# Patient Record
Sex: Male | Born: 1950 | Race: Black or African American | Hispanic: No | Marital: Married | Smoking: Former smoker
Health system: Southern US, Community
[De-identification: ages and names within clinical notes are randomized; demographics above are authoritative.]

## PROBLEM LIST (undated history)

## (undated) DIAGNOSIS — D649 Anemia, unspecified: Secondary | ICD-10-CM

## (undated) DIAGNOSIS — N2581 Secondary hyperparathyroidism of renal origin: Secondary | ICD-10-CM

## (undated) DIAGNOSIS — K219 Gastro-esophageal reflux disease without esophagitis: Secondary | ICD-10-CM

## (undated) DIAGNOSIS — I2510 Atherosclerotic heart disease of native coronary artery without angina pectoris: Secondary | ICD-10-CM

## (undated) DIAGNOSIS — R1900 Intra-abdominal and pelvic swelling, mass and lump, unspecified site: Secondary | ICD-10-CM

## (undated) DIAGNOSIS — I428 Other cardiomyopathies: Secondary | ICD-10-CM

## (undated) DIAGNOSIS — I959 Hypotension, unspecified: Secondary | ICD-10-CM

## (undated) DIAGNOSIS — K611 Rectal abscess: Secondary | ICD-10-CM

## (undated) DIAGNOSIS — J449 Chronic obstructive pulmonary disease, unspecified: Secondary | ICD-10-CM

## (undated) DIAGNOSIS — N186 End stage renal disease: Secondary | ICD-10-CM

## (undated) HISTORY — DX: Other cardiomyopathies: I42.8

## (undated) HISTORY — PX: PARATHYROIDECTOMY: SHX19

## (undated) HISTORY — DX: End stage renal disease: N18.6

## (undated) HISTORY — DX: Chronic obstructive pulmonary disease, unspecified: J44.9

## (undated) HISTORY — PX: CARDIAC CATHETERIZATION: SHX172

## (undated) HISTORY — DX: Rectal abscess: K61.1

## (undated) HISTORY — DX: Secondary hyperparathyroidism of renal origin: N25.81

## (undated) HISTORY — PX: AV FISTULA PLACEMENT: SHX1204

## (undated) HISTORY — PX: INSERTION OF DIALYSIS CATHETER: SHX1324

## (undated) HISTORY — DX: Intra-abdominal and pelvic swelling, mass and lump, unspecified site: R19.00

---

## 1994-01-25 DIAGNOSIS — N186 End stage renal disease: Secondary | ICD-10-CM

## 1994-01-25 HISTORY — DX: End stage renal disease: N18.6

## 1997-06-21 ENCOUNTER — Other Ambulatory Visit: Admission: RE | Admit: 1997-06-21 | Discharge: 1997-06-21 | Payer: Self-pay | Admitting: Nephrology

## 1997-07-29 ENCOUNTER — Ambulatory Visit (HOSPITAL_COMMUNITY): Admission: RE | Admit: 1997-07-29 | Discharge: 1997-07-29 | Payer: Self-pay | Admitting: Vascular Surgery

## 1997-07-30 ENCOUNTER — Ambulatory Visit (HOSPITAL_COMMUNITY): Admission: RE | Admit: 1997-07-30 | Discharge: 1997-07-30 | Payer: Self-pay | Admitting: Nephrology

## 1997-07-30 ENCOUNTER — Ambulatory Visit (HOSPITAL_COMMUNITY): Admission: RE | Admit: 1997-07-30 | Discharge: 1997-07-30 | Payer: Self-pay | Admitting: Cardiothoracic Surgery

## 1997-08-02 ENCOUNTER — Ambulatory Visit (HOSPITAL_COMMUNITY): Admission: RE | Admit: 1997-08-02 | Discharge: 1997-08-02 | Payer: Self-pay | Admitting: Nephrology

## 1997-08-08 ENCOUNTER — Ambulatory Visit (HOSPITAL_COMMUNITY): Admission: RE | Admit: 1997-08-08 | Discharge: 1997-08-08 | Payer: Self-pay | Admitting: Nephrology

## 1997-08-12 ENCOUNTER — Encounter (HOSPITAL_COMMUNITY): Admission: RE | Admit: 1997-08-12 | Discharge: 1997-11-10 | Payer: Self-pay | Admitting: Nephrology

## 1997-08-12 ENCOUNTER — Other Ambulatory Visit: Admission: RE | Admit: 1997-08-12 | Discharge: 1997-08-12 | Payer: Self-pay | Admitting: Nephrology

## 1997-08-14 ENCOUNTER — Other Ambulatory Visit: Admission: RE | Admit: 1997-08-14 | Discharge: 1997-08-14 | Payer: Self-pay | Admitting: Nephrology

## 1997-09-02 ENCOUNTER — Ambulatory Visit (HOSPITAL_COMMUNITY): Admission: RE | Admit: 1997-09-02 | Discharge: 1997-09-02 | Payer: Self-pay | Admitting: Vascular Surgery

## 1997-09-30 ENCOUNTER — Ambulatory Visit (HOSPITAL_COMMUNITY): Admission: RE | Admit: 1997-09-30 | Discharge: 1997-09-30 | Payer: Self-pay | Admitting: Vascular Surgery

## 1997-10-24 ENCOUNTER — Ambulatory Visit (HOSPITAL_COMMUNITY): Admission: RE | Admit: 1997-10-24 | Discharge: 1997-10-24 | Payer: Self-pay | Admitting: Vascular Surgery

## 1997-11-21 ENCOUNTER — Ambulatory Visit (HOSPITAL_COMMUNITY): Admission: RE | Admit: 1997-11-21 | Discharge: 1997-11-21 | Payer: Self-pay | Admitting: Vascular Surgery

## 1997-12-13 ENCOUNTER — Ambulatory Visit (HOSPITAL_COMMUNITY): Admission: RE | Admit: 1997-12-13 | Discharge: 1997-12-13 | Payer: Self-pay | Admitting: Vascular Surgery

## 1997-12-17 ENCOUNTER — Ambulatory Visit (HOSPITAL_COMMUNITY): Admission: RE | Admit: 1997-12-17 | Discharge: 1997-12-17 | Payer: Self-pay | Admitting: *Deleted

## 1997-12-18 ENCOUNTER — Encounter (HOSPITAL_COMMUNITY): Admission: RE | Admit: 1997-12-18 | Discharge: 1998-03-18 | Payer: Self-pay | Admitting: *Deleted

## 1997-12-24 ENCOUNTER — Ambulatory Visit (HOSPITAL_COMMUNITY): Admission: RE | Admit: 1997-12-24 | Discharge: 1997-12-24 | Payer: Self-pay | Admitting: Vascular Surgery

## 1998-02-07 ENCOUNTER — Ambulatory Visit (HOSPITAL_COMMUNITY): Admission: RE | Admit: 1998-02-07 | Discharge: 1998-02-07 | Payer: Self-pay | Admitting: *Deleted

## 1998-02-11 ENCOUNTER — Ambulatory Visit (HOSPITAL_COMMUNITY): Admission: RE | Admit: 1998-02-11 | Discharge: 1998-02-11 | Payer: Self-pay | Admitting: *Deleted

## 1998-02-20 ENCOUNTER — Ambulatory Visit: Admission: RE | Admit: 1998-02-20 | Discharge: 1998-02-20 | Payer: Self-pay | Admitting: Oral Surgery

## 1998-03-21 ENCOUNTER — Encounter: Payer: Self-pay | Admitting: Vascular Surgery

## 1998-03-21 ENCOUNTER — Ambulatory Visit (HOSPITAL_COMMUNITY): Admission: RE | Admit: 1998-03-21 | Discharge: 1998-03-21 | Payer: Self-pay | Admitting: Vascular Surgery

## 1998-04-01 ENCOUNTER — Ambulatory Visit (HOSPITAL_COMMUNITY): Admission: RE | Admit: 1998-04-01 | Discharge: 1998-04-01 | Payer: Self-pay | Admitting: *Deleted

## 1998-04-16 ENCOUNTER — Ambulatory Visit (HOSPITAL_COMMUNITY): Admission: RE | Admit: 1998-04-16 | Discharge: 1998-04-16 | Payer: Self-pay | Admitting: Vascular Surgery

## 1998-04-16 ENCOUNTER — Encounter: Payer: Self-pay | Admitting: Vascular Surgery

## 1998-04-23 ENCOUNTER — Encounter: Payer: Self-pay | Admitting: Vascular Surgery

## 1998-04-23 ENCOUNTER — Ambulatory Visit (HOSPITAL_COMMUNITY): Admission: RE | Admit: 1998-04-23 | Discharge: 1998-04-23 | Payer: Self-pay | Admitting: Vascular Surgery

## 1998-07-07 ENCOUNTER — Ambulatory Visit (HOSPITAL_COMMUNITY): Admission: RE | Admit: 1998-07-07 | Discharge: 1998-07-07 | Payer: Self-pay | Admitting: Nephrology

## 1998-07-16 ENCOUNTER — Ambulatory Visit (HOSPITAL_COMMUNITY): Admission: RE | Admit: 1998-07-16 | Discharge: 1998-07-16 | Payer: Self-pay | Admitting: Vascular Surgery

## 1998-07-16 ENCOUNTER — Encounter: Payer: Self-pay | Admitting: Vascular Surgery

## 1998-07-17 ENCOUNTER — Ambulatory Visit (HOSPITAL_COMMUNITY): Admission: RE | Admit: 1998-07-17 | Discharge: 1998-07-17 | Payer: Self-pay | Admitting: Vascular Surgery

## 1998-07-17 ENCOUNTER — Encounter: Payer: Self-pay | Admitting: Vascular Surgery

## 1998-07-22 ENCOUNTER — Encounter (HOSPITAL_COMMUNITY): Admission: RE | Admit: 1998-07-22 | Discharge: 1998-08-25 | Payer: Self-pay | Admitting: *Deleted

## 1998-12-23 ENCOUNTER — Encounter: Payer: Self-pay | Admitting: Vascular Surgery

## 1998-12-23 ENCOUNTER — Ambulatory Visit (HOSPITAL_COMMUNITY): Admission: RE | Admit: 1998-12-23 | Discharge: 1998-12-23 | Payer: Self-pay | Admitting: Vascular Surgery

## 1999-06-18 ENCOUNTER — Ambulatory Visit (HOSPITAL_COMMUNITY): Admission: RE | Admit: 1999-06-18 | Discharge: 1999-06-18 | Payer: Self-pay | Admitting: Nephrology

## 1999-07-30 ENCOUNTER — Ambulatory Visit (HOSPITAL_COMMUNITY): Admission: RE | Admit: 1999-07-30 | Discharge: 1999-07-30 | Payer: Self-pay | Admitting: Nephrology

## 1999-08-13 ENCOUNTER — Encounter (HOSPITAL_COMMUNITY): Admission: RE | Admit: 1999-08-13 | Discharge: 1999-11-11 | Payer: Self-pay | Admitting: Nephrology

## 1999-08-20 ENCOUNTER — Ambulatory Visit: Admission: RE | Admit: 1999-08-20 | Discharge: 1999-08-20 | Payer: Self-pay | Admitting: Vascular Surgery

## 1999-08-20 ENCOUNTER — Encounter: Payer: Self-pay | Admitting: Vascular Surgery

## 1999-11-10 ENCOUNTER — Encounter: Payer: Self-pay | Admitting: Vascular Surgery

## 1999-11-10 ENCOUNTER — Ambulatory Visit (HOSPITAL_COMMUNITY): Admission: RE | Admit: 1999-11-10 | Discharge: 1999-11-10 | Payer: Self-pay | Admitting: Vascular Surgery

## 1999-12-18 ENCOUNTER — Ambulatory Visit (HOSPITAL_COMMUNITY): Admission: RE | Admit: 1999-12-18 | Discharge: 1999-12-18 | Payer: Self-pay | Admitting: Nephrology

## 2000-05-24 ENCOUNTER — Ambulatory Visit (HOSPITAL_COMMUNITY): Admission: RE | Admit: 2000-05-24 | Discharge: 2000-05-24 | Payer: Self-pay | Admitting: Vascular Surgery

## 2000-06-02 ENCOUNTER — Inpatient Hospital Stay (HOSPITAL_COMMUNITY): Admission: RE | Admit: 2000-06-02 | Discharge: 2000-06-04 | Payer: Self-pay

## 2000-06-02 ENCOUNTER — Encounter (INDEPENDENT_AMBULATORY_CARE_PROVIDER_SITE_OTHER): Payer: Self-pay | Admitting: *Deleted

## 2000-07-07 ENCOUNTER — Ambulatory Visit (HOSPITAL_COMMUNITY): Admission: RE | Admit: 2000-07-07 | Discharge: 2000-07-07 | Payer: Self-pay | Admitting: Nephrology

## 2000-07-07 ENCOUNTER — Encounter: Payer: Self-pay | Admitting: Nephrology

## 2000-11-28 ENCOUNTER — Encounter: Payer: Self-pay | Admitting: Vascular Surgery

## 2000-11-28 ENCOUNTER — Inpatient Hospital Stay (HOSPITAL_COMMUNITY): Admission: RE | Admit: 2000-11-28 | Discharge: 2000-11-28 | Payer: Self-pay | Admitting: Vascular Surgery

## 2000-11-29 ENCOUNTER — Encounter: Payer: Self-pay | Admitting: Pediatrics

## 2000-11-29 ENCOUNTER — Inpatient Hospital Stay (HOSPITAL_COMMUNITY): Admission: RE | Admit: 2000-11-29 | Discharge: 2000-12-01 | Payer: Self-pay | Admitting: *Deleted

## 2001-07-21 ENCOUNTER — Ambulatory Visit (HOSPITAL_COMMUNITY): Admission: RE | Admit: 2001-07-21 | Discharge: 2001-07-21 | Payer: Self-pay | Admitting: Vascular Surgery

## 2001-07-21 ENCOUNTER — Encounter: Payer: Self-pay | Admitting: Vascular Surgery

## 2002-05-27 ENCOUNTER — Observation Stay (HOSPITAL_COMMUNITY): Admission: EM | Admit: 2002-05-27 | Discharge: 2002-05-27 | Payer: Self-pay | Admitting: *Deleted

## 2002-05-27 ENCOUNTER — Encounter: Payer: Self-pay | Admitting: Vascular Surgery

## 2003-03-07 ENCOUNTER — Ambulatory Visit (HOSPITAL_COMMUNITY): Admission: RE | Admit: 2003-03-07 | Discharge: 2003-03-07 | Payer: Self-pay | Admitting: Nephrology

## 2004-09-24 ENCOUNTER — Encounter (INDEPENDENT_AMBULATORY_CARE_PROVIDER_SITE_OTHER): Payer: Self-pay | Admitting: *Deleted

## 2004-09-24 ENCOUNTER — Ambulatory Visit (HOSPITAL_COMMUNITY): Admission: RE | Admit: 2004-09-24 | Discharge: 2004-09-24 | Payer: Self-pay | Admitting: Nephrology

## 2005-01-07 ENCOUNTER — Ambulatory Visit (HOSPITAL_COMMUNITY): Admission: RE | Admit: 2005-01-07 | Discharge: 2005-01-07 | Payer: Self-pay | Admitting: Nephrology

## 2005-05-20 ENCOUNTER — Ambulatory Visit (HOSPITAL_COMMUNITY): Admission: RE | Admit: 2005-05-20 | Discharge: 2005-05-20 | Payer: Self-pay | Admitting: Nephrology

## 2005-11-09 ENCOUNTER — Encounter: Admission: RE | Admit: 2005-11-09 | Discharge: 2005-11-09 | Payer: Self-pay | Admitting: Nephrology

## 2006-06-10 ENCOUNTER — Ambulatory Visit (HOSPITAL_COMMUNITY): Admission: RE | Admit: 2006-06-10 | Discharge: 2006-06-10 | Payer: Self-pay | Admitting: *Deleted

## 2006-06-10 ENCOUNTER — Ambulatory Visit: Payer: Self-pay | Admitting: Vascular Surgery

## 2006-11-08 ENCOUNTER — Ambulatory Visit (HOSPITAL_COMMUNITY): Admission: RE | Admit: 2006-11-08 | Discharge: 2006-11-08 | Payer: Self-pay | Admitting: *Deleted

## 2006-11-24 ENCOUNTER — Ambulatory Visit: Payer: Self-pay | Admitting: *Deleted

## 2006-12-02 ENCOUNTER — Ambulatory Visit: Payer: Self-pay | Admitting: Vascular Surgery

## 2006-12-02 ENCOUNTER — Ambulatory Visit (HOSPITAL_COMMUNITY): Admission: RE | Admit: 2006-12-02 | Discharge: 2006-12-02 | Payer: Self-pay | Admitting: *Deleted

## 2006-12-22 ENCOUNTER — Emergency Department (HOSPITAL_COMMUNITY): Admission: EM | Admit: 2006-12-22 | Discharge: 2006-12-22 | Payer: Self-pay | Admitting: Emergency Medicine

## 2006-12-23 ENCOUNTER — Ambulatory Visit (HOSPITAL_COMMUNITY): Admission: RE | Admit: 2006-12-23 | Discharge: 2006-12-23 | Payer: Self-pay | Admitting: Vascular Surgery

## 2007-01-05 ENCOUNTER — Ambulatory Visit (HOSPITAL_COMMUNITY): Admission: RE | Admit: 2007-01-05 | Discharge: 2007-01-05 | Payer: Self-pay | Admitting: Vascular Surgery

## 2007-01-19 ENCOUNTER — Inpatient Hospital Stay (HOSPITAL_COMMUNITY): Admission: EM | Admit: 2007-01-19 | Discharge: 2007-01-22 | Payer: Self-pay | Admitting: Emergency Medicine

## 2007-01-21 ENCOUNTER — Ambulatory Visit: Payer: Self-pay | Admitting: Vascular Surgery

## 2007-01-24 ENCOUNTER — Ambulatory Visit (HOSPITAL_COMMUNITY): Admission: RE | Admit: 2007-01-24 | Discharge: 2007-01-24 | Payer: Self-pay | Admitting: Surgery

## 2007-05-02 ENCOUNTER — Ambulatory Visit (HOSPITAL_COMMUNITY): Admission: RE | Admit: 2007-05-02 | Discharge: 2007-05-02 | Payer: Self-pay | Admitting: Nephrology

## 2007-08-18 ENCOUNTER — Ambulatory Visit (HOSPITAL_COMMUNITY): Admission: RE | Admit: 2007-08-18 | Discharge: 2007-08-18 | Payer: Self-pay | Admitting: Nephrology

## 2007-09-27 ENCOUNTER — Ambulatory Visit (HOSPITAL_COMMUNITY): Admission: RE | Admit: 2007-09-27 | Discharge: 2007-09-27 | Payer: Self-pay | Admitting: Nephrology

## 2007-10-15 ENCOUNTER — Ambulatory Visit: Payer: Self-pay | Admitting: *Deleted

## 2007-10-15 ENCOUNTER — Inpatient Hospital Stay (HOSPITAL_COMMUNITY): Admission: EM | Admit: 2007-10-15 | Discharge: 2007-10-15 | Payer: Self-pay | Admitting: Emergency Medicine

## 2007-10-16 ENCOUNTER — Ambulatory Visit (HOSPITAL_COMMUNITY): Admission: RE | Admit: 2007-10-16 | Discharge: 2007-10-16 | Payer: Self-pay

## 2007-10-25 ENCOUNTER — Ambulatory Visit (HOSPITAL_COMMUNITY): Admission: RE | Admit: 2007-10-25 | Discharge: 2007-10-25 | Payer: Self-pay | Admitting: Nephrology

## 2007-10-31 ENCOUNTER — Ambulatory Visit (HOSPITAL_COMMUNITY): Admission: RE | Admit: 2007-10-31 | Discharge: 2007-10-31 | Payer: Self-pay | Admitting: Nephrology

## 2007-11-07 ENCOUNTER — Ambulatory Visit (HOSPITAL_COMMUNITY): Admission: RE | Admit: 2007-11-07 | Discharge: 2007-11-07 | Payer: Self-pay | Admitting: Vascular Surgery

## 2007-11-07 ENCOUNTER — Ambulatory Visit: Payer: Self-pay | Admitting: Surgery

## 2007-11-14 ENCOUNTER — Ambulatory Visit (HOSPITAL_COMMUNITY): Admission: RE | Admit: 2007-11-14 | Discharge: 2007-11-14 | Payer: Self-pay | Admitting: Nephrology

## 2007-11-23 ENCOUNTER — Ambulatory Visit: Payer: Self-pay | Admitting: Surgery

## 2008-03-09 ENCOUNTER — Ambulatory Visit (HOSPITAL_COMMUNITY): Admission: RE | Admit: 2008-03-09 | Discharge: 2008-03-09 | Payer: Self-pay | Admitting: Surgery

## 2008-03-10 ENCOUNTER — Ambulatory Visit (HOSPITAL_COMMUNITY): Admission: RE | Admit: 2008-03-10 | Discharge: 2008-03-10 | Payer: Self-pay | Admitting: Nephrology

## 2008-03-11 ENCOUNTER — Ambulatory Visit (HOSPITAL_COMMUNITY): Admission: RE | Admit: 2008-03-11 | Discharge: 2008-03-11 | Payer: Self-pay | Admitting: Nephrology

## 2008-03-13 ENCOUNTER — Ambulatory Visit: Payer: Self-pay | Admitting: Vascular Surgery

## 2008-04-02 ENCOUNTER — Ambulatory Visit: Payer: Self-pay | Admitting: Vascular Surgery

## 2008-04-02 ENCOUNTER — Ambulatory Visit (HOSPITAL_COMMUNITY): Admission: RE | Admit: 2008-04-02 | Discharge: 2008-04-02 | Payer: Self-pay | Admitting: Vascular Surgery

## 2008-05-02 ENCOUNTER — Ambulatory Visit (HOSPITAL_COMMUNITY): Admission: RE | Admit: 2008-05-02 | Discharge: 2008-05-02 | Payer: Self-pay | Admitting: Nephrology

## 2008-05-23 ENCOUNTER — Ambulatory Visit (HOSPITAL_COMMUNITY): Admission: RE | Admit: 2008-05-23 | Discharge: 2008-05-23 | Payer: Self-pay | Admitting: Vascular Surgery

## 2008-05-23 ENCOUNTER — Ambulatory Visit: Payer: Self-pay | Admitting: Vascular Surgery

## 2008-06-18 ENCOUNTER — Ambulatory Visit: Payer: Self-pay | Admitting: *Deleted

## 2008-07-08 ENCOUNTER — Ambulatory Visit: Admission: RE | Admit: 2008-07-08 | Discharge: 2008-07-08 | Payer: Self-pay | Admitting: Nephrology

## 2008-09-12 ENCOUNTER — Ambulatory Visit: Payer: Self-pay | Admitting: Vascular Surgery

## 2008-10-18 IMAGING — CR DG CHEST 1V PORT
1 series · 1 of 1 positions shown · non-contrast
Comparison: 12/23/06.

CLINICAL DATA: Dialysis dependent patient, cough, shortness of breath.
PORTABLE CHEST - 1 VIEW 01/19/07:

[AP]
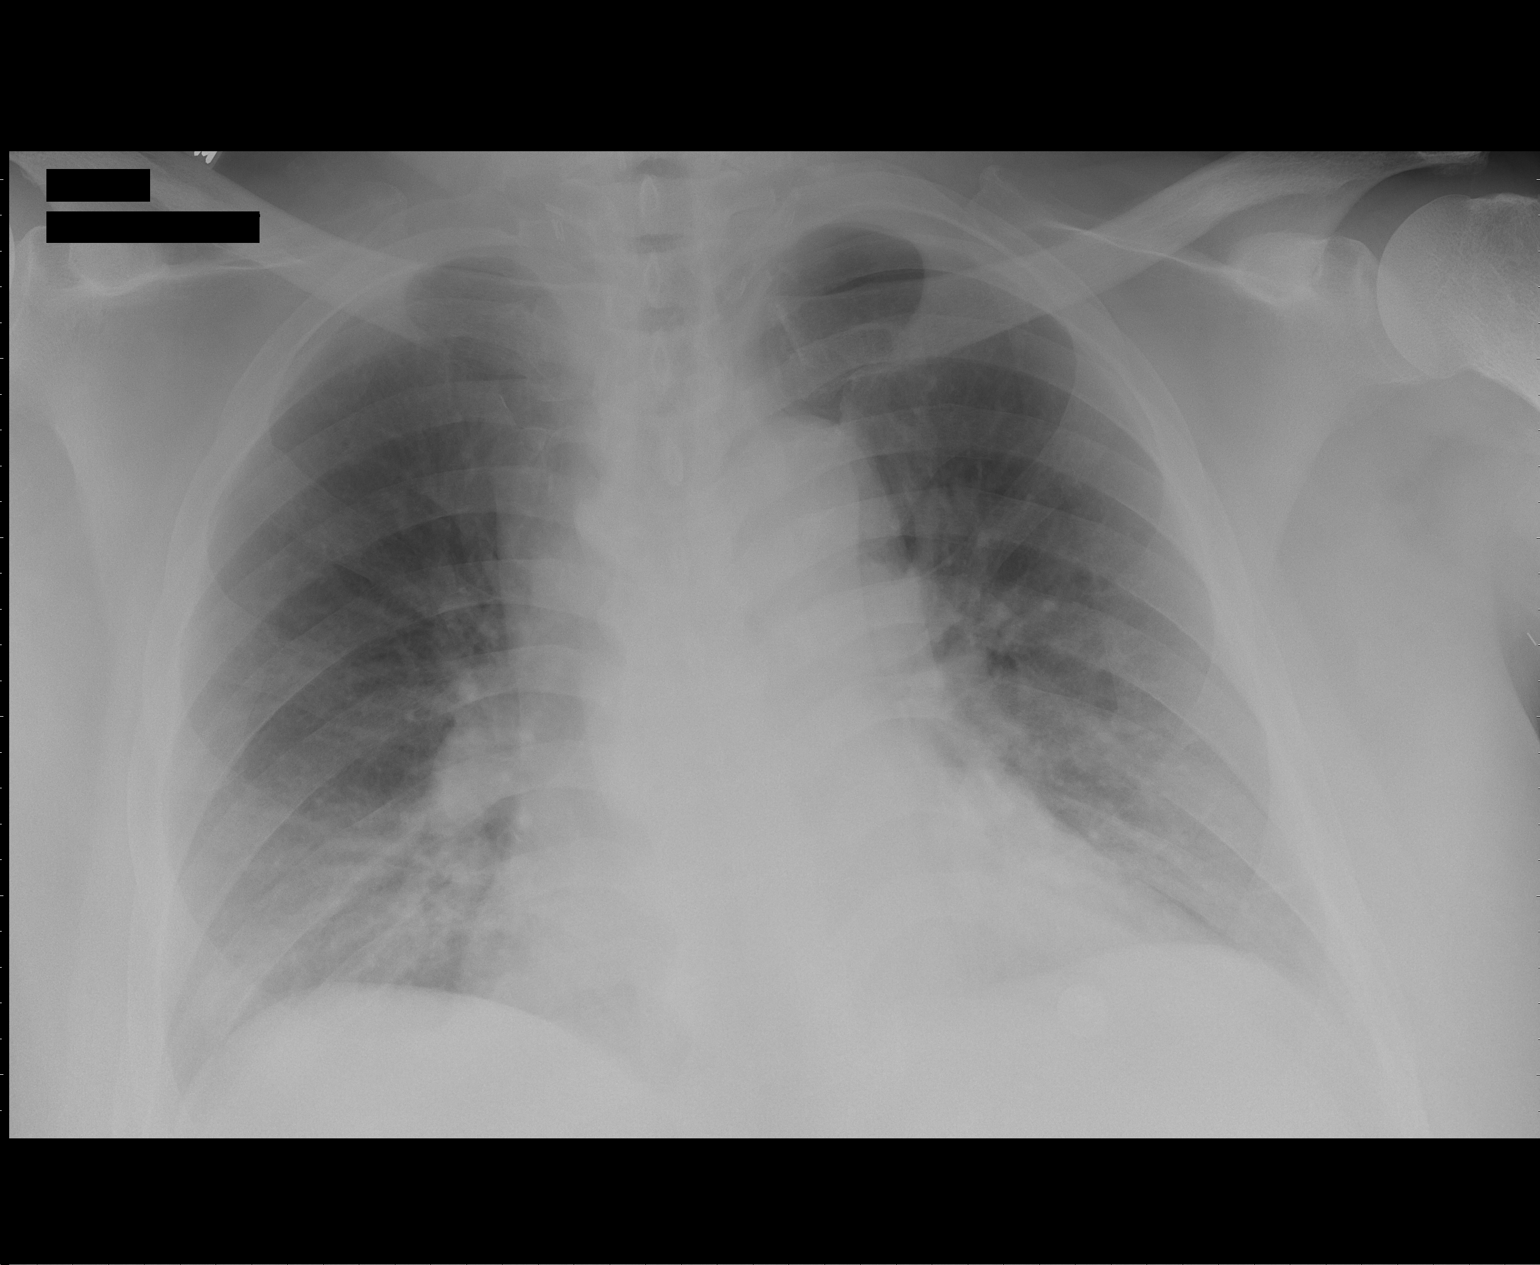

[1 of 1 positions shown; findings below may reference images not displayed]

FINDINGS: The heart is enlarged with mild hilar and basilar interstitial edema pattern compared to 12/02/06.  No large effusion or pneumothorax.
IMPRESSION: Cardiomegaly with early edema.

## 2008-10-24 ENCOUNTER — Ambulatory Visit: Payer: Self-pay | Admitting: Vascular Surgery

## 2008-10-31 ENCOUNTER — Ambulatory Visit: Payer: Self-pay | Admitting: Vascular Surgery

## 2008-11-04 ENCOUNTER — Ambulatory Visit (HOSPITAL_COMMUNITY): Admission: RE | Admit: 2008-11-04 | Discharge: 2008-11-04 | Payer: Self-pay | Admitting: Nephrology

## 2009-01-17 ENCOUNTER — Ambulatory Visit (HOSPITAL_COMMUNITY): Admission: EM | Admit: 2009-01-17 | Discharge: 2009-01-17 | Payer: Self-pay | Admitting: Emergency Medicine

## 2009-01-17 ENCOUNTER — Ambulatory Visit: Payer: Self-pay | Admitting: Vascular Surgery

## 2009-01-30 ENCOUNTER — Ambulatory Visit: Payer: Self-pay | Admitting: Vascular Surgery

## 2009-03-13 ENCOUNTER — Ambulatory Visit: Payer: Self-pay | Admitting: Vascular Surgery

## 2009-03-31 ENCOUNTER — Ambulatory Visit: Payer: Self-pay | Admitting: Vascular Surgery

## 2009-06-04 ENCOUNTER — Ambulatory Visit: Payer: Self-pay | Admitting: Vascular Surgery

## 2009-07-24 IMAGING — US IR INTRO INTRAVASC STENT 1ST VESSEL
1 series · 2 of 2 positions shown · non-contrast
Comparison: none

CLINICAL DATA: End stage renal disease.  The patient has had
recurrent thrombosis of a right upper arm dialysis graft and is
status post percutaneous declot procedures on 08/18/2007 and
09/27/2007.  Surgical revision was performed on the [DATE] with
placement of a 6 mm extension graft.  The graft then became
reoccluded within 2 days and a left femoral dialysis catheter was
placed. The patient now presents for additional declot procedure
with planned placement of a covered stent to treat resistant
anastomotic stenosis.

[Series 1: ir intro intravasc stent 1st vessel · 2 of 2 slices shown]
[im 1/2]
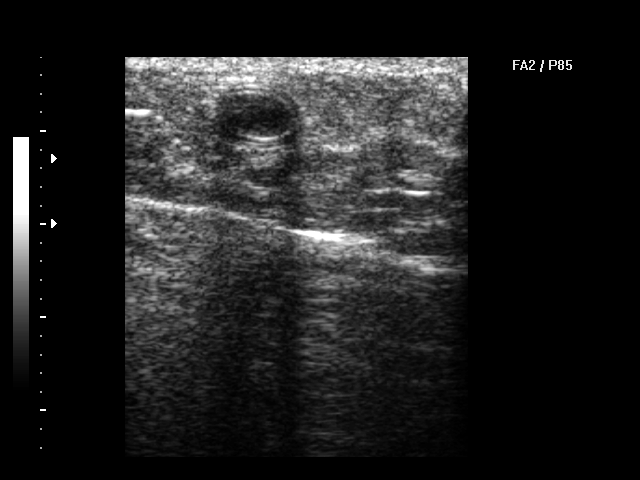
[im 2/2]
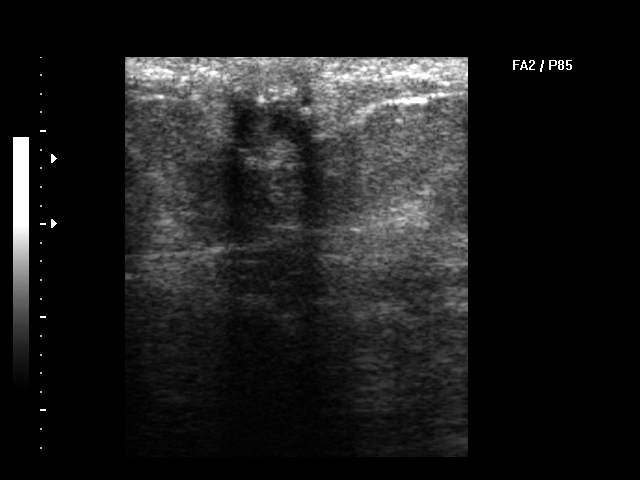

[2 of 2 positions shown; findings below may reference images not displayed]

1.  ULTRASOUND GUIDANCE FOR VASCULAR ACCESS OF RIGHT UPPER ARM
DIALYSIS GRAFT
2.  DIALYSIS GRAFT DECLOT PROCEDURE
3.  GRAFT AND VENOUS ANGIOPLASTY.
4.  INTRAVASCULAR STENT PLACEMENT

Sedation: 5.0 mg IV Versed; 250 mcg IV Fentanyl.

Total Moderate Sedation Time: 180 minutes.

Contrast Volume: 180 ml

Additional Medications: 2 mg TPA, 0555 units intravenous heparin

Fluoroscropy Time: 26.7 minutes.

Procedure: The procedure, risks, benefits, and alternatives were
explained to the patient. Questions regarding the procedure were
encouraged and answered. The patient understands and consents to
the procedure.

The right upper arm dialysis graft was prepped with betadine in a
sterile fashion, and a sterile drape was applied covering the
operative field. A sterile gown and sterile gloves were used for
the procedure. Local anesthesia was provided with 1% Lidocaine.

Antegrade and retrograde graft access was performed with
micropuncture sets and ultrasound guidance.  1 mg of TPA was then
instilled in each end of the graft via micropuncture dilators.

The graft access was upsized to a 7-French antegrade sheath along
the lower end of the upper arm graft and a retrograde 6-French
sheath.

A 5-French catheter was advanced into the graft and contrast
injection performed.  It was then advanced into patent subclavian
vein and central venogram performed.  Heparin was administered
initially through the catheter.

The venous anastomosis was initially dilated with a 6 mm x 4 cm
Conquest balloon.  Mechanical thrombectomy was then performed in
the graft with the AngioJet thrombectomy device.  The arterial plug
was then pulled with a 4-French Fogarty balloon catheter.

Additional angioplasty was performed in the distal segment of the
graft and across the venous anastomosis with a 7 mm x 4 cm Conquest
balloon.  Antegrade sheath access was upsized to 9-French.

Prima Joni Desboy Fluency covered stent was then deployed in the distal graft.
A 7 mm x 40 mm stent was initially deployed.  This was post dilated
to 7 mm.  Additional angiography was performed. Vein was dilated
with 810 mm x 4 cm Conquest balloon.

Koka Cat graft was then placed across the venous anastomosis of
the graft.  An 8 mm x 40 mm device was chosen and deployed.

Additional balloon angioplasty was performed in the graft with the
7 mm balloon.  An 8 mm x 4 cm balloon was also utilized to post
dilate the FLAIR graft.

An overlapping 7 mm x 60 mm Fluency stent was then placed in the
graft.  This overlapped the original 7 mm x 40 mm Fluency stent by
approximately 50%.  Additional angioplasty was performed in the
stent and graft with a 7 mm balloon.

After the procedure, both sheaths were removed and hemostasis
obtained with application of Ethilon pursestring sutures.
FINDINGS: There were two significant focal areas of stenosis which
were resistant to balloon angioplasty.  One is at the new venous
anastomosis in the axilla.  At that site, there also is thrombus in
the draining axillary vein.  The second was in the graft itself at
the revision site at the level of the anastomosis between the old
graft and the new jump graft.  Both areas showed very poor response
to initial 6 mm balloon angioplasty.

After restoring flow, additional areas of intragraft stenosis were
also visualized and treated with balloon angioplasty including one
area in the old graft.  Intragraft stenosis did not improve after 7
mm balloon angioplasty.  A covered stent was therefore placed.
This stent lies at a level which currently is beyond puncture sites
of the graft.

The thrombus in the axillary vein did not respond well to AngioJet
thrombectomy.  It did behave in a fashion likely consistent with
chronic thrombus.  The thrombus was macerated with a 10 mm balloon
resulting in some diminishment in appearance of the clot.

It was still decided to place a FLAIR covered stent across the new
venous anastomosis in order to try to save this access.  Device
deployed well and the distal portion of the graft and venous
anastomosis are widely patent.  Additional thrombus in the axillary
vein was not treated with further stenting as there was fear that
side branch veins would be occluded resulting in right upper
extremity venous thrombosis.

There was a focal area of thrombus likely related to injury to the
graft that did not resolve with balloon angioplasty or mechanical
thrombectomy.  This is located along the proximal end of the
original Fluency stent.  An overlapping stent was therefore placed
extending further into the proximal graft.  This did result in
significant improved patency.  Mild areas of residual adherent
thrombus were present in the midportion of the graft upon
completion of the procedure but were not flow limiting.  The
arterial anastomosis demonstrated mild irregularity following the
procedure.
IMPRESSION: Successful reopening of right upper arm dialysis graft.  Etiology
of rethrombosis was a resistant venous anastomotic stenosis as well
as a resistant stenosis at the anastomosis of a graft extension.
Covered stents were utilized to treat these areas.

Access management:  Fairly extensive covered stent treatment was
required on today's case.  There is no other option at this point
other than repeat angioplasty of the graft.  The axillary clot
remains problematic but should hopefully resolve over time with
good flow.  There are very few other percutaneous options remaining
if the graft should rethrombosis and it may not be worthwhile to
try to open the graft again.  There are certainly no further
surgical options remaining at the level of the current graft due to
stent treatment.

## 2009-07-30 IMAGING — XA IR [PERSON_NAME]/EXT/UNI*L*
1 series · 13 of 24 positions shown · non-contrast
Comparison: none

CLINICAL HISTORY: End-stage renal disease and looking for new access
in the left upper extremity.

[Series 1: run · 13 of 25 slices shown]
[im 1/25]
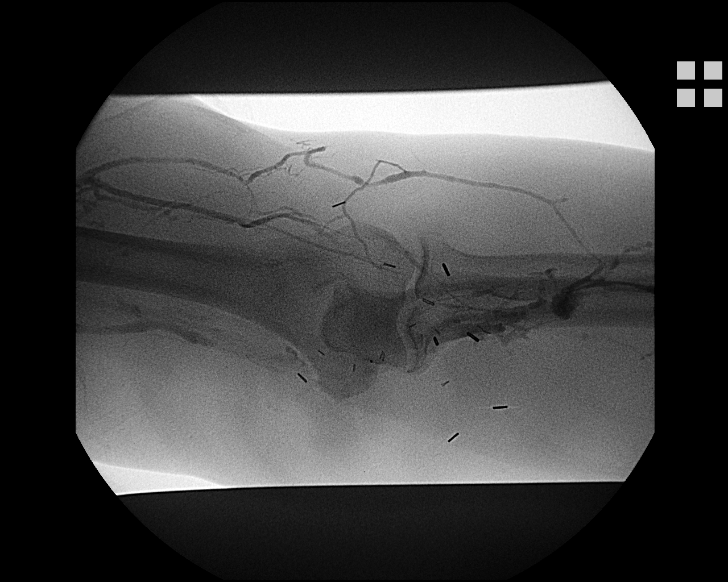
[im 3/25]
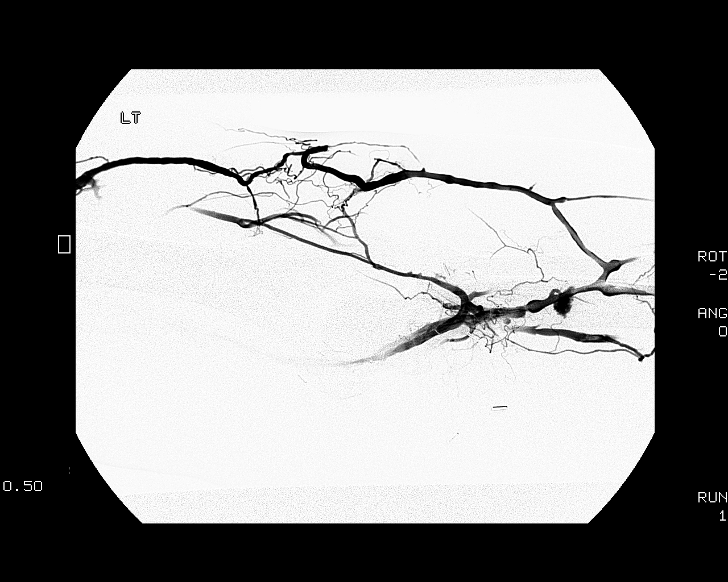
[im 5/25]
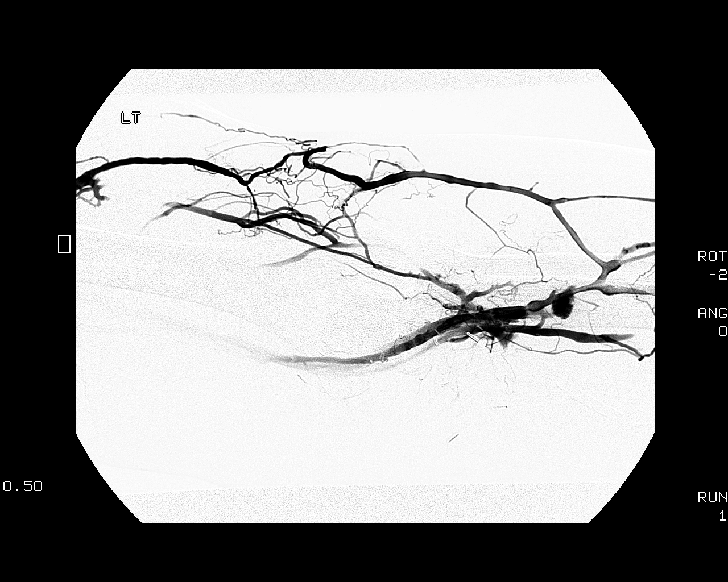
[im 7/25]
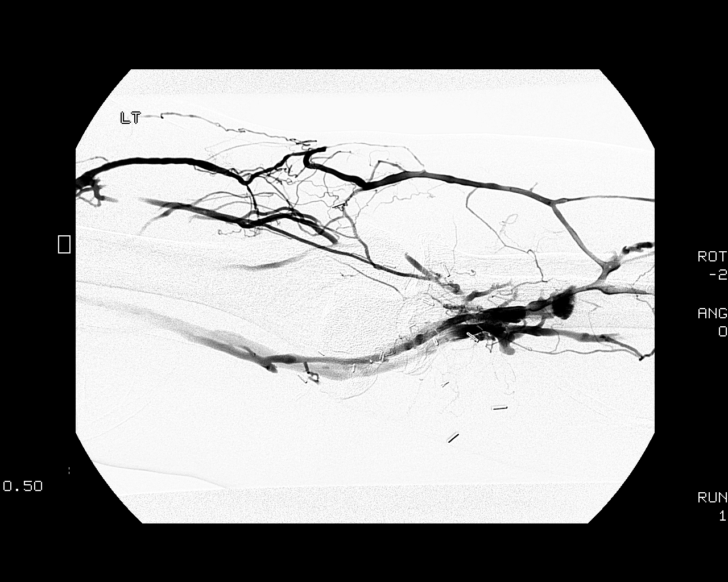
[im 9/25]
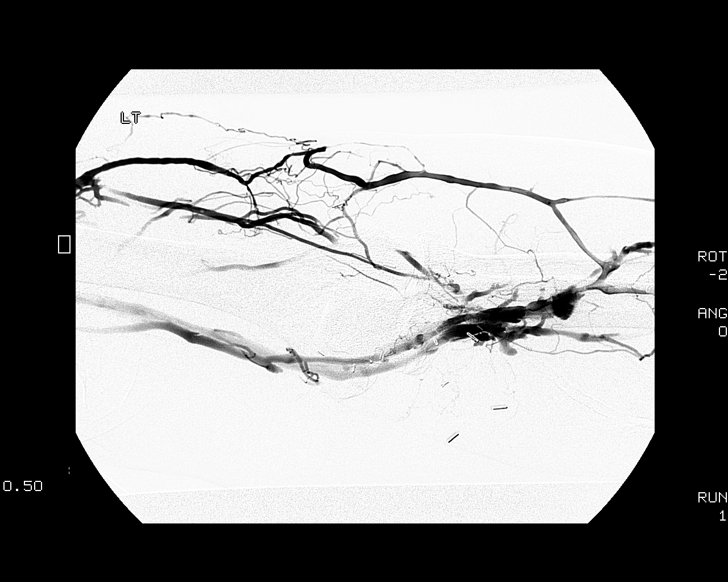
[im 11/25]
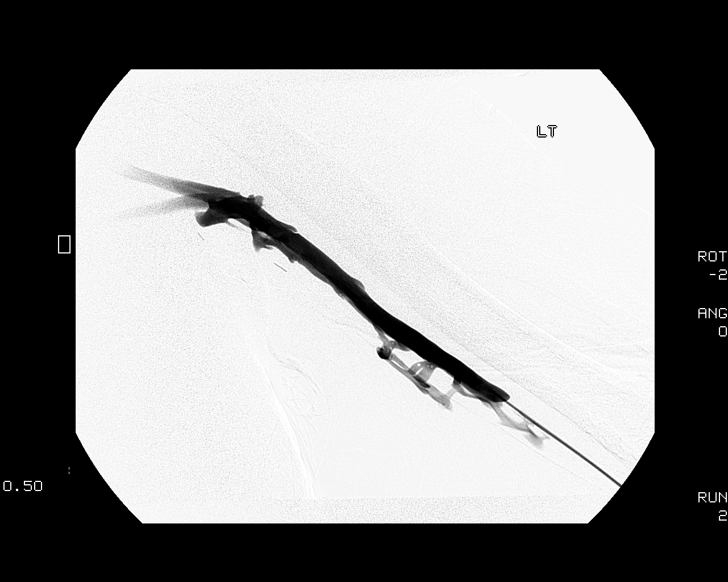
[im 13/25]
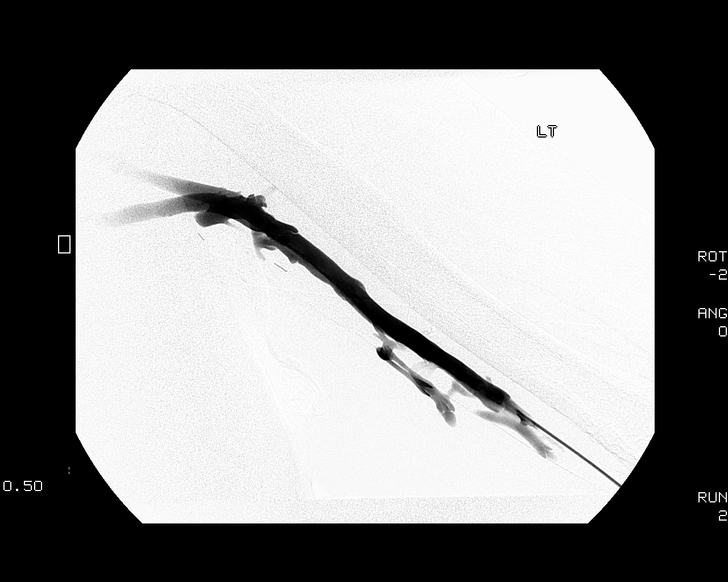
[im 14/25]
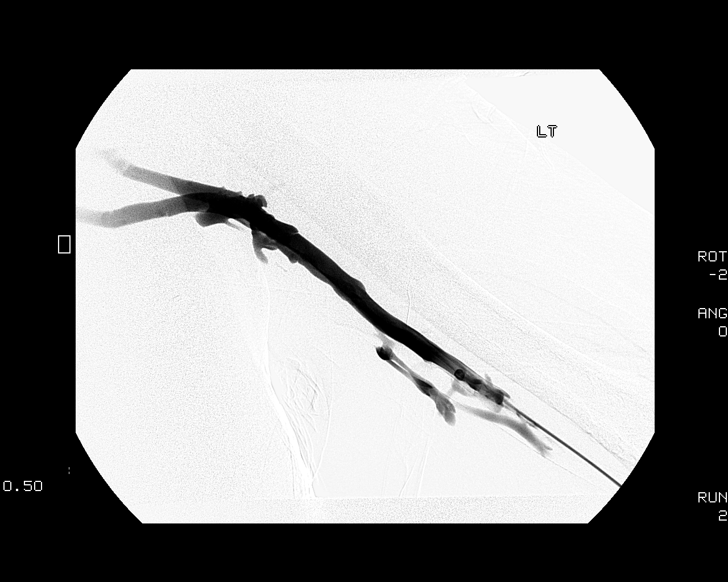
[im 16/25]
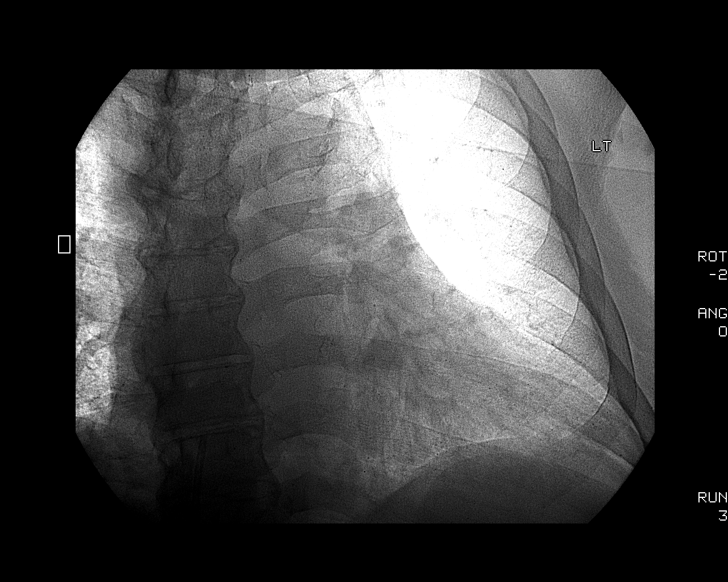
[im 18/25]
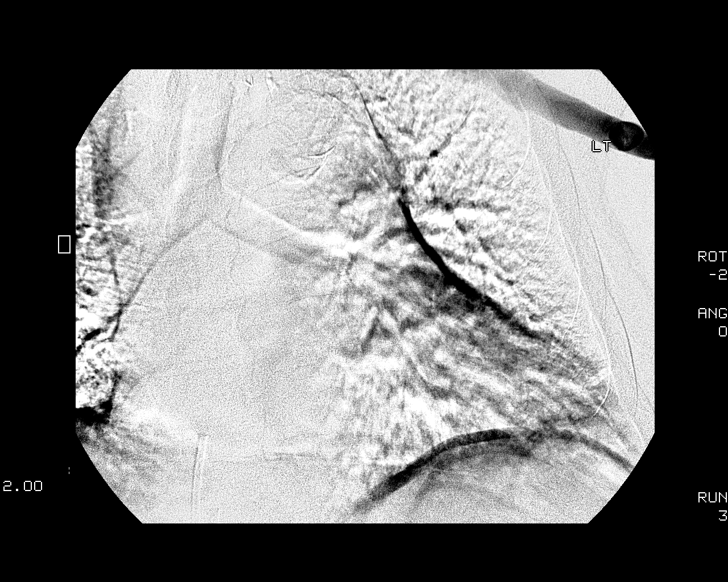
[im 20/25]
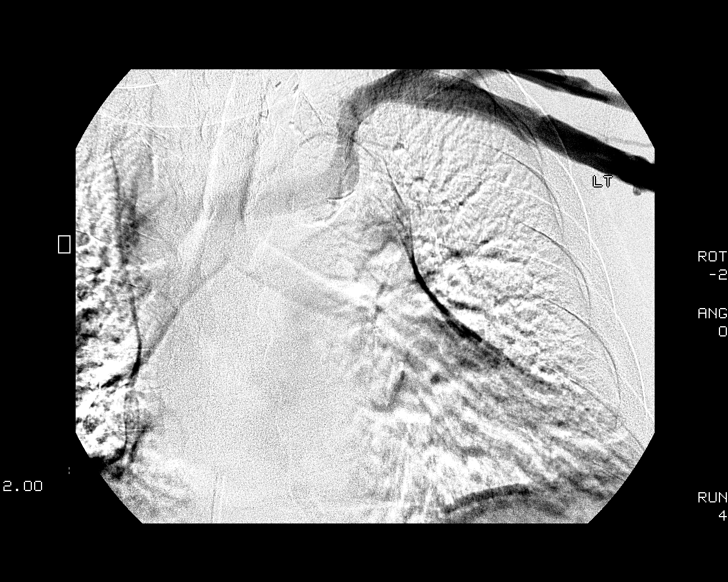
[im 22/25]
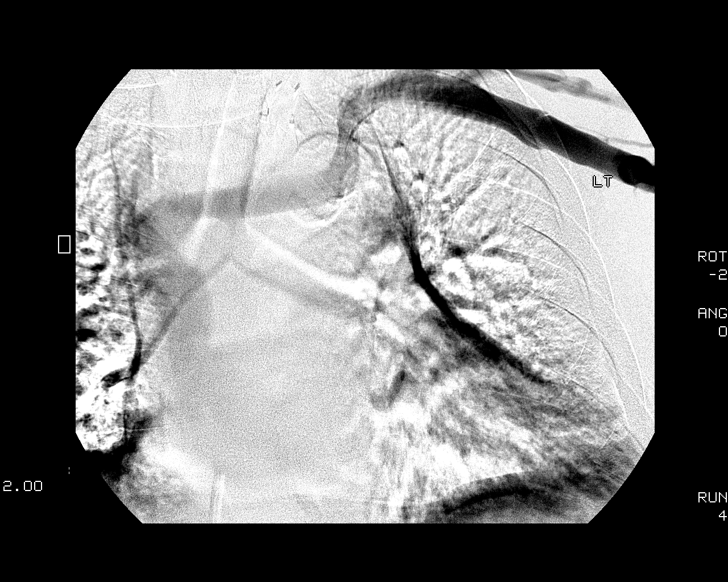
[im 25/25]
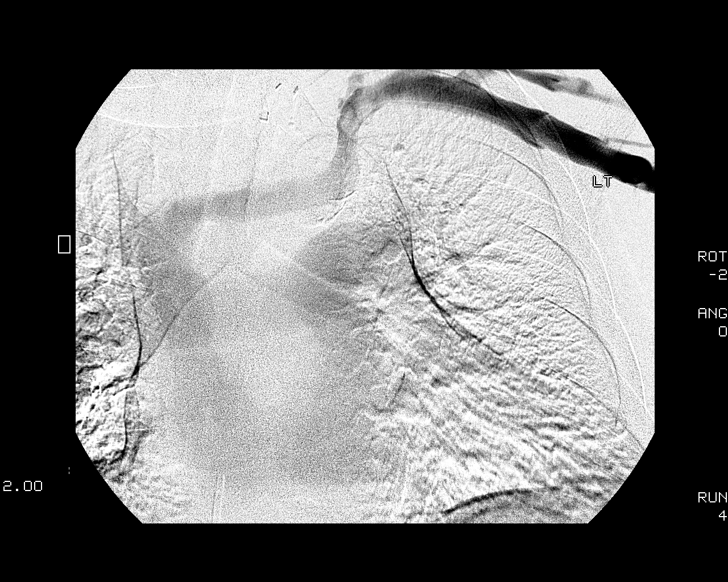

[13 of 24 positions shown; findings below may reference images not displayed]

PROCEDURE(S): LEFT UPPER EXTREMITY VENOGRAM; ULTRASOUND FOR
VASCULAR ACCESS.

Medications:None

Sedation time:None

Procedure:The patient was found have a small superficial vein just
below the elbow.  This vein was only identified with ultrasound.  A
small peripheral IV was placed using ultrasound guidance.  Venogram
was performed through this catheter but there was extravasation
from this vessel and this access would not accommodate a complete
left upper extremity venogram.  As a result, the arm above the
elbow was prepped and draped in sterile fashion.  Ultrasound
demonstrated a patent brachial vein.  The skin was anesthetized
with 1% lidocaine.  21 gauge needle was directed into the vein with
ultrasound guidance and a micropuncture dilator set was placed.
The left upper extremity venogram was performed through this venous
access.
FINDINGS: Multiple small veins in the left antecubital region.
Patency of the left brachial vein.  The left axillary and
subclavian vein are patent.  The left innominate vein is patent.
The superior vena cava is patent.
IMPRESSION: Patency of the left central venous structures.

## 2009-10-27 ENCOUNTER — Ambulatory Visit: Payer: Self-pay | Admitting: Vascular Surgery

## 2010-02-15 ENCOUNTER — Encounter: Payer: Self-pay | Admitting: Nephrology

## 2010-02-16 ENCOUNTER — Encounter: Payer: Self-pay | Admitting: Nephrology

## 2010-02-16 ENCOUNTER — Ambulatory Visit: Payer: Self-pay | Admitting: Vascular Surgery

## 2010-02-17 ENCOUNTER — Ambulatory Visit: Payer: Self-pay | Admitting: Vascular Surgery

## 2010-03-05 ENCOUNTER — Ambulatory Visit: Payer: Self-pay | Admitting: Vascular Surgery

## 2010-03-12 ENCOUNTER — Ambulatory Visit: Payer: Self-pay | Admitting: Vascular Surgery

## 2010-04-16 ENCOUNTER — Ambulatory Visit: Payer: Self-pay | Admitting: Vascular Surgery

## 2010-04-27 LAB — POCT I-STAT, CHEM 8
BUN: 50 mg/dL — ABNORMAL HIGH (ref 6–23)
Creatinine, Ser: 9.8 mg/dL — ABNORMAL HIGH (ref 0.4–1.5)
Hemoglobin: 13.9 g/dL (ref 13.0–17.0)
Potassium: 4.6 mEq/L (ref 3.5–5.1)
Sodium: 135 mEq/L (ref 135–145)
TCO2: 29 mmol/L (ref 0–100)

## 2010-04-30 LAB — CBC
HCT: 34.3 % — ABNORMAL LOW (ref 39.0–52.0)
Hemoglobin: 11.4 g/dL — ABNORMAL LOW (ref 13.0–17.0)
MCHC: 33.3 g/dL (ref 30.0–36.0)
MCV: 94.9 fL (ref 78.0–100.0)
Platelets: 218 10*3/uL (ref 150–400)
RBC: 3.61 MIL/uL — ABNORMAL LOW (ref 4.22–5.81)
RDW: 17.2 % — ABNORMAL HIGH (ref 11.5–15.5)
WBC: 7.9 10*3/uL (ref 4.0–10.5)

## 2010-04-30 LAB — POTASSIUM: Potassium: 5.8 mEq/L — ABNORMAL HIGH (ref 3.5–5.1)

## 2010-05-06 LAB — POCT I-STAT 4, (NA,K, GLUC, HGB,HCT)
Glucose, Bld: 94 mg/dL (ref 70–99)
HCT: 44 % (ref 39.0–52.0)
Hemoglobin: 15 g/dL (ref 13.0–17.0)
Sodium: 134 mEq/L — ABNORMAL LOW (ref 135–145)

## 2010-05-07 LAB — POCT I-STAT 4, (NA,K, GLUC, HGB,HCT): Glucose, Bld: 93 mg/dL (ref 70–99)

## 2010-05-12 LAB — POCT I-STAT 4, (NA,K, GLUC, HGB,HCT)
HCT: 48 % (ref 39.0–52.0)
Hemoglobin: 16.3 g/dL (ref 13.0–17.0)
Potassium: 5.4 mEq/L — ABNORMAL HIGH (ref 3.5–5.1)
Sodium: 136 mEq/L (ref 135–145)

## 2010-05-12 LAB — POTASSIUM: Potassium: 5.5 mEq/L — ABNORMAL HIGH (ref 3.5–5.1)

## 2010-05-25 ENCOUNTER — Ambulatory Visit: Payer: Self-pay | Admitting: Vascular Surgery

## 2010-06-09 NOTE — Op Note (Signed)
[name], [name]             ACCOUNT NO.:  [id number]   MEDICAL RECORD NO.:  [id number]          PATIENT TYPE:  INP   LOCATION:  6740                         FACILITY:  [facility]   PHYSICIAN:  Di Kindle. Edilia Bo, M.D.DATE OF BIRTH:  04-04-1950   DATE OF PROCEDURE:  01/21/2007  DATE OF DISCHARGE:  01/22/2007                               OPERATIVE REPORT   PREOPERATIVE DIAGNOSIS:  Clotted right upper arm arteriovenous graft.   POSTOPERATIVE DIAGNOSIS:  Clotted right upper arm arteriovenous graft.   PROCEDURE:  1. Thrombectomy, right upper arm AV graft.  2. Intraoperative fistulogram.   SURGEON:  Di Kindle. Edilia Bo, M.D.   ASSISTANT:  Nurse.   ANESTHESIA:  Local.   TECHNIQUE:  The patient was taken to the operating room sedated by  anesthesia.  The right upper extremity was prepped and draped in the  usual sterile fashion.   After the skin was infiltrated with 1% lidocaine, the incision was made  in the axilla and the venous limb of the graft dissected free.  The  anastomosis was very high in the axilla.  The graft was divided.  The  patient was heparinized.  Graft thrombectomy was achieved using a #4  Fogarty catheter.  There were no problems in pulling the catheter  through the body of the graft itself.  Excellent inflow was established.  The graft was flushed with heparinized saline and clamped.  Next, venous  thrombectomy was performed.  I then passed a dilator proximally, and  approximately 4 cm central to the venous anastomosis, there appeared to  be some narrowing, although I was able to get a 4.5-mm dilator through  this area.  This was beyond the area where I could surgically revise it.  The graft was sewn back end-to-end with continuous 6-0 Prolene suture.  Intraoperative fistulogram was obtained which showed no retained clot.  Hemostasis was obtained in the wound.  The wound was closed with a deep  layer of 3-0 Vicryl and the skin closed with 4-0 Vicryl.   A sterile  dressing was applied.   The patient tolerated the procedure well and was transferred to the  recovery room in satisfactory condition.  All needle and sponge counts  were correct.      Di Kindle. Edilia Bo, M.D.  Electronically Signed     CSD/MEDQ  D:  01/21/2007  T:  01/22/2007  Job:  [id number]

## 2010-06-09 NOTE — Consult Note (Signed)
VASCULAR SURGERY CONSULTATION   AMYR, SLUDER D  DOB:  July 27, 1950                                       11/24/2006  [id number]#:[id number]   New patient office visit.   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   HISTORY:  Mr. Ellers is a 60 year old gentleman on long-term  hemodialysis.  He has a right upper arm arteriovenous graft in place.  This recently underwent revision via percutaneous angioplasty carried  out in the radiology department November 08, 2006.  The graft continues  to function well.  However, he has developed 2 distinct areas of  aneurysm on the graft.  This does not cause him any pain.  He has had no  bleeding.  Denies numbness in his hand.   REVIEW OF SYSTEMS:  Denies recent weight loss or anorexia.  Tolerating  his dialysis well.  Dialysis Monday, Wednesday, Friday at [facility]   PHYSICAL EXAMINATION:  GENERAL:  Moderately obese 60 year old male in no  acute distress.  No cyanosis, pallor or jaundice.  VITAL SIGNS:  BP 89/59, pulse 107 per minute, respirations 18 per  minute.  EXTREMITIES:  Right upper arm graft remains patent.  There are 2  distinct areas of pseudoaneurysm in the upper arm graft.  There is some  early ulceration noted over the most distal of these, right radial pulse  1+ and 1+ edema of the hand.   IMPRESSION:  1. End-stage renal failure.  2. 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   MEDICAL DECISION MAKING:  Mr. Carmen does have a degenerating right  upper arm graft with early ulceration noted over a pseudoaneurysm.  This  poses a risk of potential rupture and significant bleeding.  I have  recommended we pursue right upper arm graft replacement and also  placement of a Diatek catheter.  He is agreeable to this, scheduled for  December 02, 2006 at [facility] following  his regular dialysis  run.   Balinda Quails, M.D.  Electronically Signed  PGH/MEDQ  D:  11/24/2006  T:  11/25/2006  Job:  [id number]

## 2010-06-09 NOTE — Op Note (Signed)
[name], [name]             ACCOUNT NO.:  [id number]   MEDICAL RECORD NO.:  [id number]          PATIENT TYPE:  INP   LOCATION:  2550                         FACILITY:  [facility]   PHYSICIAN:  Balinda Quails, M.D.    DATE OF BIRTH:  [birth year]   DATE OF PROCEDURE:  10/15/2007  DATE OF DISCHARGE:  10/15/2007                               OPERATIVE REPORT   SURGEON:  Balinda Quails, MD   ASSISTANT:  Wilmon Arms, PA   ANESTHETIC:  General with LMA.   ANESTHESIOLOGIST:  Zenon Mayo, MD   PREOPERATIVE DIAGNOSES:  1. End-stage renal failure.  2. Clotted right upper arm arteriovenous graft.   POSTOPERATIVE DIAGNOSES:  1. End-stage renal failure.  2. Clotted right upper arm arteriovenous graft.   PROCEDURE:  Thrombectomy revision right upper arm arteriovenous graft.   OPERATIVE PROCEDURE:  The patient was brought to the operating room in  stable condition.  Placed in a supine position.  Right arm prepped and  draped in a sterile fashion.  Anesthesia induced with LMA.   Skin and subcutaneous tissues instilled with 1% Xylocaine with  epinephrine.  Longitudinal skin incision made through the scar in the  right axilla.  Dissection carried down through the subcutaneous tissue.  The venous limb of the graft identified.  This was followed down through  an end-to-end anastomosis high in the axilla to this axillary vein.  The  vein was mobilized proximally and controlled with a Gregory clamp.  The  venous anastomosis taken down.  There was a marked pseudointimal  narrowing.  The graft trimmed proximally free of pseudointima.  The vein  thrombectomized with a 5 Fogarty catheter with excellent backbleeding.  The graft thrombectomized with 5 Fogarty catheter with excellent inflow.  The graft was filled with heparin saline solution and controlled with a  fistula clamp.  A new segment of 6 mm Gore-Tex was anastomosed end-to-  end to divide the graft using running 6-0 Prolene  suture.  This was then  anastomosed end-to-end to the axillary vein using running 6-0 Prolene  suture.  Clamps were then removed.  Excellent flow present.  Adequate  hemostasis obtained.  Sponge and instrument counts were correct.   Subcutaneous tissue closed in two layers with running 3-0 Vicryl suture.  The skin closed with 4-0 Monocryl.  Dermabond applied.   The patient tolerated the procedure well.  No apparent intraoperative  complications.  Transferred to recovery room in stable condition.   No further surgical revisions were indicated in this case due to the  high anastomosis of the axilla.  Should the graft thrombose after 6  weeks can be treated with radiologic lysis and angioplasty.      Balinda Quails, M.D.  Electronically Signed     PGH/MEDQ  D:  10/15/2007  T:  10/15/2007  Job:  [id number]

## 2010-06-09 NOTE — Op Note (Signed)
NAMEKEM, HENSEN             ACCOUNT NO.:  [id number]   MEDICAL RECORD NO.:  [id number]          PATIENT TYPE:  AMB   LOCATION:  SDS                          FACILITY:  [facility]   PHYSICIAN:  Di Kindle. Edilia Bo, M.D.DATE OF BIRTH:  08/01/1950   DATE OF PROCEDURE:  12/02/2006  DATE OF DISCHARGE:  12/02/2006                               OPERATIVE REPORT   PREOPERATIVE DIAGNOSIS:  Chronic renal failure with degenerative right  upper arm arteriovenous graft with two aneurysms.   POSTOPERATIVE DIAGNOSIS:  Chronic renal failure with degenerative right  upper arm arteriovenous graft with two aneurysms.   PROCEDURE:  1. Ultrasound guided placement of a right internal jugular Diatek      catheter.  2. Placement of a new right upper arm arteriovenous graft.   SURGEON:  Di Kindle. Edilia Bo, M.D.   ASSISTANT:  Nurse.   ANESTHESIA:  Local with sedation.   TECHNIQUE:  The patient was taken to the operating room and sedated by  anesthesia.  The neck and upper chest were prepped and draped in the  usual sterile fashion.  The ultrasound scanner was used to identify the  right IJ and after the skin was anesthetized, the right IJ was  cannulated and a guidewire introduced into the superior vena cava under  fluoroscopic control.  The tract over the wire was dilated and then a  dilator and peel away sheath were passed over the wire and wire and  dilator removed.  The catheter was passed through the peel away sheath  and positioned in the right atrium.  The exit site for the catheter was  selected and the skin anesthetized between the two areas.  The catheter  was then brought to the tunnel, cut to the appropriate length, and the  distal ports were attached.  Both ports withdrew easily.  We then  flushed with heparinized saline filled with concentrated heparin.  The  catheter was secured at its exit site with 3-0 nylon suture.  The IJ  cannulation site was closed with a 4-0 subcuticular  stitch.  A sterile  dressing was applied.   Next, attention was turned to the right arm.  The right arm was prepped  and draped in the usual sterile fashion.  This patient had two large  aneurysms in this graft and the recommendation had been to replace the  graft.  After the skin was anesthetized at both ends, a longitudinal  incision was made over the old graft near the axilla and then just above  the antecubital level of the graft.  Both ends were dissected free.  The  patient was heparinized after a new 7 mm graft was tunneled between the  two incisions.  There were some platelets present in the proximal end of  the graft and these were thrombectomized. The arterial anastomosis was,  otherwise, widely patent.  The new segment of graft was cut to the  appropriate length and sewn end-to-end to the old graft using continuous  6-0 Prolene suture at the arterial end.  At the venous end, a venous  thrombectomy was performed, there was no real  clot retrieved from the  venous, and the venous anastomosis did take a 5 mm dilator.  The graft  was cut to the appropriate length and sewn end-to-end to the old graft  using continuous 6-0 Prolene suture.  At the completion, there was a  good thrill in the graft.  Of note, his pressure did run low.  Hemostasis was obtained in the wounds.  The wounds were closed with a  deep layer  of 3-0 Vicryl and the skin closed with 4-0 Vicryl.  A sterile dressing  was applied.  The patient tolerated the procedure well and was  transferred to the recovery room in satisfactory condition.  All needle  and sponge counts were correct.      Di Kindle. Edilia Bo, M.D.  Electronically Signed     CSD/MEDQ  D:  12/02/2006  T:  12/03/2006  Job:  [id number]

## 2010-06-09 NOTE — Assessment & Plan Note (Signed)
OFFICE VISIT   [name], [name]  DOB:  [birth year]                                       03/13/2008  EAVWU#:[id number]   The patient returns today for followup for consideration for placement  of a new hemodialysis access.  He is referred by Dr. Arrie Aran.  The  patient has had multiple prior grafts in the right and left arms.  Apparently the upper portion of the left upper arm graft was taken out  for infection in 1999.  The patient was most recently seen by Dr.  Myra Gianotti in October of 2009.  At that time he had just had a central  venogram performed which showed that the axillary and central veins on  the left side were still patent.  The plan at that time was to consider  placing a left upper arm graft.  However, at that time he still had a  functioning right upper arm graft and it was delayed since his graft was  still functioning.  Recently the right upper arm graft failed and the  patient now has a catheter in place that he is using for dialysis.  He  currently dialyzes on Monday, Wednesdays and Fridays.   He is not taking any oral anticoagulation other than aspirin.   PHYSICAL EXAM:  Vital signs:  Today blood pressure is 96/67 in the left  arm.  Pulse is 96 and regular.  He has a 2+ left brachial pulse above a  large amount of scar tissue near the antecubital crease.  He also has a  1+ left radial pulse.  He has multiple scars on the left arm from  previous graft placements.  He has well-healed scars and incisions and  no obvious evidence of infection in the left arm.   Review of his central venogram from September of 2009 shows a patent  axillary and central venous system on the left side.   Rather than placing a thigh graft at this time I believe as Dr. Myra Gianotti  stated previously in his note from October of 2009 that the best option  would be to explore his left axillary vein and if this is a reasonable  vein place a new left upper arm graft.   I discussed with the patient  that there may be some increased risk of infection due to previous  infection of the graft at this site but hopefully this would have  cleared at this point.  Also risks of bleeding, infection, steal and  thrombosis were explained to the patient.  He understands and agrees to  proceed.  His graft is scheduled to be placed on Tuesday, March 9.   Janetta Hora. Fields, MD  Electronically Signed   CEF/MEDQ  D:  03/13/2008  T:  03/14/2008  Job:  [id number]

## 2010-06-09 NOTE — Op Note (Signed)
[name], [name]             ACCOUNT NO.:  [id number]   MEDICAL RECORD NO.:  [id number]          PATIENT TYPE:  AMB   LOCATION:  SDS                          FACILITY:  [facility]   PHYSICIAN:  Charles E. Fields, MD  DATE OF BIRTH:  September 14, 1950   DATE OF PROCEDURE:  10/16/2007  DATE OF DISCHARGE:  10/16/2007                               OPERATIVE REPORT   PROCEDURES:  1. Ultrasound of neck.  2. Attempted right and left subclavian Diatek catheter.  3. Ultrasound of left groin.  4. Insertion of left femoral vein Diatek catheter.   PREOPERATIVE DIAGNOSIS:  End-stage renal disease.   POSTOPERATIVE DIAGNOSIS:  End-stage renal disease.   ANESTHESIA:  Local with IV sedation.   OPERATIVE FINDINGS:  1. 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  2. Unsuccessful cannulation of left and right subclavian veins.  3. A 55-cm Diatek catheter, left femoral vein.   OPERATIVE DETAILS:  After obtaining informed consent, the patient was  taken to the operating room.  The patient was placed in the supine  position on the operating table.  After adequate sedation, the patient's  neck was inspected with an ultrasound.  The right and left internal  jugular veins were not visualized and presumed to be occluded.  Next,  the patient's entire neck and chest were prepped and draped in the usual  sterile fashion.  Several attempts were made to cannulate the left and  right subclavian veins using an infraclavicular approach.  I was unable  to aspirate blood on either attempts.  Therefore, attempts in the chest  were aborted.  Attention was then turned to the patient's left groin.  Local anesthesia was infiltrated over the area of the left common  femoral vein.  Ultrasound was used to identify the left common femoral  vein.  The patient had previous left thigh graft.  An area on the left  common femoral vein was identified above the level of the graft and  under ultrasound guidance, the left  common femoral vein was cannulated  with a Majestic needle.  A 0.035 J-tip guidewire was then threaded  through the left common femoral vein up to the right atrium under  fluoroscopic guidance.  Next, a 5-French end-hole catheter was placed  over the J-wire in the left common femoral vein due to dense scar  tissue.  The J-wire was removed and a 0.035 Amplatz wire was threaded up  to the right atrium under fluoroscopic guidance.  Next, sequential 12,  14, and 16-French dilators with peel-away sheath were placed over the  guidewire into the left common iliac vein system.  The dilator was  removed and a 55-cm Diatek catheter was placed using a weave technique  over the guidewire and through the peel-away sheath up to level of the  right atrium.  The catheter tip was at the border of the right atrium  and inferior vena cava.  Next, the peel-away sheath was removed.  The  catheter was then tunneled subcutaneously, cut to length, and hub  attached.  Catheter was noted to flush and draw easily.  Catheter was  sutured to skin with  nylon sutures.  The insertion site was also closed with nylon sutures.  The patient tolerated the procedure well and there were no  complications.  Instrument, sponge, and needle counts were correct at  the end of the case.  The patient was taken to the recovery room in  stable condition.      Janetta Hora. Fields, MD  Electronically Signed     CEF/MEDQ  D:  10/16/2007  T:  10/17/2007  Job:  [id number]

## 2010-06-09 NOTE — Op Note (Signed)
NAMENICKOLIS, DIEL             ACCOUNT NO.:  [id number]   MEDICAL RECORD NO.:  [id number]          PATIENT TYPE:  AMB   LOCATION:  SDS                          FACILITY:  [facility]   PHYSICIAN:  Charles E. Fields, MD  DATE OF BIRTH:  1950/04/05   DATE OF PROCEDURE:  05/23/2008  DATE OF DISCHARGE:  05/23/2008                               OPERATIVE REPORT   PROCEDURE:  Thrombectomy and left upper arm arteriovenous graft.   PREOPERATIVE DIAGNOSIS:  Thrombosed arteriovenous graft, left arm.   POSTOPERATIVE DIAGNOSIS:  Thrombosed arteriovenous graft, left arm.   ANESTHESIA:  Local with IV sedation.   ASSISTANT:  Wilmon Arms, PA-C   OPERATIVE FINDINGS:  1. No significant venous narrowing with 8-mm outflow vein.  2. Low blood pressure during case with diastolic pressures running      between 10 and 30 mmHg.   OPERATIVE DETAILS:  After obtaining informed consent, the patient was  taken to the operating.  The patient was placed in the supine position  on the operating table.  After adequate sedation, the patient's entire  left upper extremity was prepped and draped in the usual sterile  fashion.  Local anesthesia was infiltrated at a preexisting longitudinal  scar up in the axilla.  There was a 2-mm area of granulation tissue in  the center portion of this incision which did not form a sinus tract.  This was excised.  The incision was then reopened and carried down  through the subcutaneous tissues down to the level of the graft.  Grafts  were dissected free circumferentially.  Dissection was carried down on  the level of the anastomosis.  This had been previously done end to  side.  The outflow vein was very large in caliber, approximately 8 mm in  diameter.  There were 2 large branches more proximally and these were  dissected free circumferentially and vessel loops were placed around  these.  At this point, the patient was given 7000 units of intravenous  heparin.  A  transverse graftotomy was made just above the level of the  anastomosis.  A #4 Fogarty catheter was used to thrombectomize the  venous limb of the graft.  Multiple passes were made until all  thrombotic material was removed and a clean pass was obtained.  There  was good backbleeding from the vein at this point.  This was flushed  thoroughly with heparinized saline and occluded with a Henley clamp.  Attention was then turned to the arterial limb of the graft.  Multiple  passes were made with #3 and #4 Fogarty catheter until all thrombotic  material was removed and arterialized plug was retrieved.  There was  excellent arterial inflow at this point.  Arterialized plug was  retrieved.  Multiple passes were made to make sure that there was no  further thrombotic debris.  Graft was then clamped proximally with a  fistula clamp.  The graftotomy was then reapproximated using running 6-0  Prolene suture.  Just prior to completion of anastomosis, this was  forebled, backbled, and thoroughly flushed.  Anastomosis was secured.  Clamps were  released.  There was a weakly palpable thrill above the  graft immediately.  Doppler was used to examine the radial and ulnar  arteries and these both had good flow.  Hemostasis was obtained.  Deep  tissues were closed with running 2-0 and 3-0 Vicryl suture.  Skin was  closed with a 4-0 Vicryl  subcuticular stitch.  The patient tolerated the procedure well and there  were no complications.  Instrument, sponge, and needle counts were  correct at the end of the case.  The patient was taken to the recovery  room in stable condition.      Janetta Hora. Fields, MD  Electronically Signed     CEF/MEDQ  D:  05/23/2008  T:  05/24/2008  Job:  [id number]

## 2010-06-09 NOTE — Op Note (Signed)
[name], Colon             ACCOUNT NO.:  [id number]   MEDICAL RECORD NO.:  [id number]          PATIENT TYPE:  AMB   LOCATION:  SDS                          FACILITY:  [facility]   PHYSICIAN:  Juleen China IV, MDDATE OF BIRTH:  08-21-1950   DATE OF PROCEDURE:  01/24/2007  DATE OF DISCHARGE:                               OPERATIVE REPORT   PREOPERATIVE DIAGNOSIS:  End-stage renal disease.   POSTOPERATIVE DIAGNOSIS:  End-stage renal disease.   PROCEDURE PERFORMED:  1. Right arm graft study and central venogram.  2. Venoplasty.  3. Ultrasound-guided graft access.   INDICATIONS:  This is a 60 year old gentleman who recently underwent  operative thrombectomy for his right arm autograft, which had  thrombosed.  At the time of operation, difficulty was encountered  passing the Fogarty catheter central.  The area of difficulty was  outside of the surgical field and therefore, a simple thrombectomy was  performed and the patient comes in today for a graft study.  Risks and  benefits were discussed.  Informed consent was signed.   PROCEDURE:  The patient was identified in the holding area and taken to  room #7.  He was placed supine on the table.  The right arm was prepped  and draped in standard sterile fashion.  The graft was evaluated with  ultrasound and found to be widely patent; 1% lidocaine was used for  local anesthesia.  Under ultrasound guidance, the right arm graft was  accessed with micropuncture needle.  An 0.018 wire was advanced through  the graft without resistance under fluoroscopic visualization.  Next, a  5-French micropuncture sheath was placed.  Three-way stopcock was placed  into the catheter.  A graft study was performed.   FINDINGS:  The graft was widely patent.  There was area of luminal  narrowing at the junction of the graft and the subclavian vein.  There  was no central stenosis.   After the above images, the decision was made to intervene.  Over a  Rosen wire, the micropuncture sheath was exchanged out for a 6-French  sheath.  A 6 x 3 balloon was used to perform venoplasty at the junction  of the graft and the subclavian vein.  Minimal waste was encountered.  Balloon was taken to 12 atmosphere and held out for 1 minute.  A  followup study revealed slight improvement in luminal narrowing in this  region.  The residual disease did not appear to be hemodynamically  significant.  At this point, the arterial end of anastomosis was  evaluated.  With the 6 mm balloon inflated within the graft, a contrast  injection was performed to evaluate the arterial anastomosis.  There was  no hemodynamically significant stenosis of the arterial anastomosis.   After the above procedure was complete, the sheath was removed with the  use of the Woggle technique.  The patient was taken to recovery room  without complication.   IMPRESSION:  1. Widely patent right upper arm graft.  2. Venoplasty of the subclavian vein.  3. No evidence of central venous stenosis.  ______________________________  V. Charlena Cross, MD  Electronically Signed     VWB/MEDQ  D:  01/24/2007  T:  01/24/2007  Job:  [id number]

## 2010-06-09 NOTE — H&P (Signed)
NAMEJOJUAN, CHAMPNEY             ACCOUNT NO.:  [id number]   MEDICAL RECORD NO.:  [id number]          PATIENT TYPE:  INP   LOCATION:  1829                         FACILITY:  [facility]   PHYSICIAN:  Wilber Bihari. Caryn Section, M.D.   DATE OF BIRTH:  02-29-1950   DATE OF ADMISSION:  01/19/2007  DATE OF DISCHARGE:                              HISTORY & PHYSICAL   Mr. Alamo is a 60 year old black man who came to the ER today with  weakness.  He has had diarrhea 3-4 times per day for the last 5 days  according to his wife.  He has had no loss of consciousness.  No  seizures.  He vomited x1 on December 22, but has had no vomiting since  then according to his wife.  She says, however, that he can keep food  and liquids down, but they run right through him.  No blood in the  diarrhea.   PAST MEDICAL HISTORY:  1. 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  2. 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  3. Obstructive sleep apnea (no CPAP).  4. Hypotension (chronic).  5. New AVG November 7 (right upper arm), PermCath removed about 3      weeks ago.   MEDICATIONS:  Wife is not sure.  She has a bottle of Fosrenol, calcium,  and midodrine (10 t.i.d.).   ALLERGIES:  None.   REVIEW OF SYSTEMS:  No melena.  No hematochezia.  No renal colic.  No  dysuria.  No gross hematuria.  No loss of consciousness.  No seizures.  No DOE.  No orthopnea.  No purulent sputum.  No angina.  No  claudication.   SOCIAL HISTORY:  He was born in [place].  Graduated from high  school.  Also took courses at [employer] in early childhood education.  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  No cigarettes.  No alcohol.  Used to smoke for about 5  years.   FAMILY MEDICAL HISTORY:  Mother and father both died in their 63s of ?  He had 11 siblings; five are living.  No renal disease in siblings.   He  has 1 daughter, age 91, who is well.   PHYSICAL EXAMINATION:  GENERAL:  He is awake, alert, restless.  VITAL SIGNS:  Temperature 101.3, pulse 106, respirations 18, blood  pressure 102/67 (67/41 prior to a normal saline bolus).  CHEST:  Clear.  HEART:  No rub.  ABDOMEN:  Nontender.  Bowel sounds are present.  EXTREMITIES:  AVG patent in right upper arm.  There is no tenderness  around the old or new AV graft.  No expressible purulence.  No pretibial  edema.  NEURO:  No flap.  Strength equal.  Sensation intact.   Chest x-ray:  No infiltrates.  No effusions.  EKG:  No acute changes.  O2 sat 96% on room air.  Hemoglobin 11, WBC 20,600 (92 PMN), platelets  157K.  Sodium 135, potassium 4.7, chloride 102, CO2 24, BUN 49,  creatinine 11.2.   IMPRESSION:  1. Fever, diarrhea.  ? etiology.  2. Hypotension (chronic).  3. End-stage renal disease.  4. Obstructive sleep apnea with good O2 sats.   PLAN:  1. Blood culture, stool culture, stool O and P, stool C diff,      vancomycin, Fortaz.  2. IV normal saline.  3. Get records from outpatient dialysis center.  4. No suggestions.   I suspect he has sepsis from staph ?  from access.  We will treat  accordingly.  Admit to ICU or intermediate care.      Richard F. Caryn Section, M.D.  Electronically Signed     RFF/MEDQ  D:  01/19/2007  T:  01/19/2007  Job:  [id number]

## 2010-06-09 NOTE — Assessment & Plan Note (Signed)
OFFICE VISIT   [name], [name]  DOB:  1950/12/06                                       11/23/2007  KGURK#:[id number]   REASON FOR VISIT:  Need for dialysis access.   HISTORY:  This is a 57-year gentleman with end-stage renal disease who  has had multiple, multiple accesses performed in both upper extremities  as well as his left thigh.  He is currently dialyzing through a right  upper arm graft which has been revised recently and has exhausted  percutaneous options.  Due to the location of stent placement he is not  a candidate for another right upper arm graft.  He did have a venogram  performed at [facility] several weeks ago which reveals a patent left  brachial vein and patent central venous system.  He comes in today for  discussion of possible left upper arm access.   PHYSICAL EXAMINATION:  Blood pressure is 107/66, pulse is 95.  He has a  functioning right upper arm graft which has been revised multiple times  recently.  He has multiple incisions in his left upper arm.  His right  groin has not ever had an access placed in it yet.   DIAGNOSTIC STUDIES:  I have reviewed the patient's venogram.  He does  have a patent brachial vein and patent central venous system on the  left.   IMPRESSION:  End-stage renal disease.   PLAN:  Since the patient is limited on access sites I do think it is  reasonable to explore his left axilla to see if he is a candidate for a  redo left upper arm graft.  I told him that I could not guarantee that  this would be successful.  My plan is to explore the vein in his axilla  first.  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  However,  given his limited access sites I think it is reasonable to explore this  and potentially place a left upper arm graft.  This has been  scheduled  for Thursday, November 12.   Jorge Ny, MD  Electronically Signed   VWB/MEDQ  D:  11/23/2007  T:  11/24/2007  Job:  [id number]   cc:   Terrial Rhodes, M.D.

## 2010-06-09 NOTE — Op Note (Signed)
NAMEYANIEL, LIMBAUGH             ACCOUNT NO.:  [id number]   MEDICAL RECORD NO.:  [id number]          PATIENT TYPE:  AMB   LOCATION:  SDS                          FACILITY:  [facility]   PHYSICIAN:  Juleen China IV, MDDATE OF BIRTH:  [birth year]   DATE OF PROCEDURE:  12/23/2006  DATE OF DISCHARGE:                               OPERATIVE REPORT   PREOPERATIVE DIAGNOSIS:  End-stage renal disease.   POSTOPERATIVE DIAGNOSIS:  End-stage renal disease.   PROCEDURES PERFORMED:  1. Ultrasound-guided access, left internal jugular vein.  2. Diatek catheter placement.  3. Central venogram.  4. Balloon venoplasty of the innominate vein.   ANESTHESIA:  MAC.   BLOOD LOSS:  Minimal.   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   DESCRIPTION OF PROCEDURE:  After informed consent, the patient was taken  to room #9.  He was placed supine on the table.  MAC anesthesia was  administered.  The patient was prepped and draped in the standard  sterile fashion.  Using ultrasound, the left internal jugular vein was  evaluated and found to be widely patent.  A 1% lidocaine was used for  local anesthesia.  With an 18-gauge needle, the left internal jugular  vein was accessed under ultrasound guidance.  A #3 FiberWire was  advanced until resistance was met.  The medium size dilator was then  placed.  Under fluoroscopy, I was able to navigate the J-wire into the  inferior vena cava.  At this point, I switched out for a Amplatz  superstiff wire, which was placed into the inferior vena cava.  Next,  peel-away sheath was placed.  I was unable to advance the catheter  through the sheath due to the area of stenosis.  Therefore, I replaced  the Amplatz wire, however, it was unable to get it to turn, caught out,  and so a Kumpe catheter was used to navigate the wire back into the IVC.  The Diatek was then advanced over the wire, but was again unable to pass  the area of concern.  At this point in time, the  sheath was pulled back  into the Kumpe catheter and central venogram was obtained.  This showed  the area that showed there to be a slight kink in the internal jugular  vein and the innominate vein.  For that reason, I took an 8 x 3 balloon  over the Amplatz wire, performed venoplasty of the innominate vein.  Following venoplasty, the Diatek catheter was advanced over the wire  without difficulty.  Catheter was then seated in the caval-atrial  junction.  I then created a separate stab incision for the subcutaneous  tunnel.  The tunnel site was anesthetized with 1% lidocaine and then  subsequently dilated.  The catheter was brought through the tunnel and  the cuff was placed at the skin exit site.  Under fluoroscopy, I  evaluated the catheter.  The proximal limb appeared to have double back  on itself and first with a J-wire followed by Glidewire the proximal  port was manipulated, so that it turned caudad.  At this point in  time,  the catheter was then evaluated under fluoroscopy.  There were no kinks  present and the catheter was in good position.  The catheter was sutured  into place with a 3-0 nylon.  The skin incision and the neck was closed  with 4-0 Vicryl.  Both ports were flushed and aspirated without  difficulty.  They were filled with appropriate volumes of heparin.  The  patient was taken to recovery room in stable condition.           ______________________________  V. Charlena Cross, MD  Electronically Signed     VWB/MEDQ  D:  12/23/2006  T:  12/23/2006  Job:  [id number]

## 2010-06-09 NOTE — Op Note (Signed)
[name], [name]             ACCOUNT NO.:  [id number]   MEDICAL RECORD NO.:  [id number]          PATIENT TYPE:  AMB   LOCATION:  SDS                          FACILITY:  [facility]   PHYSICIAN:  Janetta Hora. Fields, MD  DATE OF BIRTH:  [birth year]   DATE OF PROCEDURE:  04/02/2008  DATE OF DISCHARGE:  04/02/2008                               OPERATIVE REPORT   PROCEDURE:  Left upper arm arteriovenous graft.   PREOPERATIVE DIAGNOSIS:  End-stage renal disease.   POSTOPERATIVE DIAGNOSIS:  End-stage renal disease.   ANESTHESIA:  Local with IV sedation.   ASSISTANT:  Wilmon Arms, PA-C   OPERATIVE FINDINGS:  1. A 6-mm PTFE.  2. A 5-mm axillary vein.   OPERATIVE DETAILS:  After obtaining informed consent, the patient was  taken to the operating room.  The patient was placed in supine position  on the operating table.  After adequate sedation, the patient's entire  left upper extremity was prepped and draped in usual sterile fashion.  The patient had multiple previous left upper arm grafts.  These had all  been disconnected recently for infection or had thrombosed.  Preoperative venogram had showed that he did have a patent axillary vein  and it was decided that since this was his last upper extremity possible  access site that we would try exploring his left upper arm to replace a  left upper arm graft in lieu of placing the thigh grafts.   Initially, after prepping the patient's entire left upper extremity,  local anesthesia was infiltrated up the axilla.  A longitudinal incision  was made through a preexisting scar.  Incision was carried down through  the subcutaneous tissues down to level of preexisting left upper arm  graft.  This graft was dissected free circumferentially and this was  followed down to the level of previous venous anastomosis.  Dissection  was then carried up on the level of the main axillary vein.  This was  approximately 5 mm in diameter.  This was  dissected free  circumferentially.  A large branch on the distal end was also dissected  free circumferentially and a vessel loop was placed around this.  The  old graft was disconnected and approximately 6 cm of this was resected  and allowed to retract up into the mid upper arm.  Next, attention was  turned to the lower arm.  Local anesthesia was infiltrated at the  bicipital groove just above the antecubital crease.  Local anesthesia  was infiltrated in this area.  A longitudinal incision was made and  carried down through the subcutaneous tissues down to the level of the  preexisting PTFE grafts.  This had been previously placed to the  brachial artery.  It was dissected free circumferentially.  Dissection  was carried down through the level of brachial artery and it was  dissected free circumferentially proximal and distal to the previous  arterial anastomosis.  A graft was transected approximately 3 cm above  the previous anastomosis.  This was occluded.  The graft was then  dissected free circumferentially at the transected segment,  approximately  6 cm of this was removed and allowed to retract up into  the mid upper arm.  Next, a subcutaneous tunnel was created in an arcing  configuration connecting the upper and lower arm incisions with the new  graft lateral to all previous grafts.  A 6-mm PTFE graft was then  brought through this subcutaneous tunnel.  The patient was then given  5000 units of intravenous heparin.  The remainder of the graft was  removed from the artery and the new graft spatulated and sewn end of  graft to side of artery using a running 6-0 Prolene suture.  Just prior  to completion anastomosis, this was fore-bled, back-bled, and thoroughly  flushed.  Anastomosis was secured.  Vessel loops were released.  There  was a palpable pulse in the graft immediately.  Next, the graft was  pulled taut to length.  The old venous anastomosis was completely taken  down from  the vein.  There was a large valve at the opening of the vein  and this was lysed with scissors.  The vein was of good quality above  this and approximately 5 mm in diameter.  The graft was cut to length  and beveled and sewn end of graft to side of vein using a running 6-0  Prolene suture.  Just prior to completion anastomosis, this was fore-  bled, back-bled, and thoroughly flushed.  Anastomosis was secured.  Clamps were released.  There was a palpable thrill above the graft  immediately.  Next, hemostasis was obtained.  Subcutaneous tissues of  both incisions were reapproximated using running 3-0 Vicryl suture.  Skin of both incisions were closed with 4-0 Vicryl subcuticular stitch.  The patient tolerated the procedure well with no immediate  complications.  Instrument, sponge, and needle counts were correct at  the end of the case.  The patient was taken to recovery room in stable  condition.  The patient had an audible radial Doppler signal at the end  of the case and was augmented approximately 30% with compression of the  graft.      Janetta Hora. Fields, MD  Electronically Signed     CEF/MEDQ  D:  04/02/2008  T:  04/03/2008  Job:  [id number]

## 2010-06-09 NOTE — Op Note (Signed)
[name], [name]             ACCOUNT NO.:  [id number]   MEDICAL RECORD NO.:  [id number]          PATIENT TYPE:  AMB   LOCATION:  SDS                          FACILITY:  [facility]   PHYSICIAN:  Quita Skye. Hart Rochester, M.D.  DATE OF BIRTH:  06/06/1950   DATE OF PROCEDURE:  06/10/2006  DATE OF DISCHARGE:                               OPERATIVE REPORT   PREOPERATIVE DIAGNOSIS:  End-stage renal disease with thrombosed right  upper arm AV graft.   POSTOPERATIVE DIAGNOSIS:  End-stage renal disease with thrombosed right  upper arm AV graft.   OPERATION:  Thrombectomy AV Gore-Tex graft right upper arm with  exploration of venous end.   SURGEON:  Dr. Quita Skye. Hart Rochester.   FIRST ASSISTANT:  Nurse.   ANESTHESIA:  Local.   PROCEDURE:  The patient was taken to the operating room, placed in the  supine position at which time right upper extremity was prepped with  Betadine scrub and solution and draped in a routine sterile manner.  After infiltration with 1% Xylocaine with epinephrine, longitudinal  incision was made near the axilla.  Gore-Tex was dissected free as high  as possible.  It was anastomosed in the axillary vein high in the axilla  and proximal revision was not feasible.  Three thousand units of heparin  given intravenously and a transverse opening made in the graft.  The  venous anastomosis was thrombectomized easily with the Fogarty catheter  passing into the chest and a 5 dilator would traverse this area.  I did  remove some well organized thrombus from the axillary vein just above  this after passing the Fogarty on several occasions and excellent back  bleeding occurred following this.  The graft itself had a few small  pseudoaneurysms along its course.  However, the graft was easily  thrombectomized and there was excellent pulsatile inflow reestablished.  After multiple passes with the Fogarty no further clot was retrieved.  The graft was reclosed with continuous 6-0 Prolene.  The  clamp released  and there was an excellent pulse and palpable thrill and excellent  Doppler flow in the graft.  No protamine was given.  The wound was  irrigated with saline, closed in layers with Vicryl in a subcuticular  fashion.  Sterile dressing applied.  The patient taken to the recovery  in satisfactory condition.      Quita Skye Hart Rochester, M.D.  Electronically Signed     JDL/MEDQ  D:  06/10/2006  T:  06/10/2006  Job:  [id number]

## 2010-06-12 NOTE — Op Note (Signed)
[place]  Patient:    COLTRANE, TUGWELL                    MRN: [id number] Proc. Date: 06/02/00 Adm. Date:  [id number] Attending:  Gennie Alma CC:         Mindi Slicker. Lowell Guitar, M.D.   Operative Report  CCS# [id number]  PREOPERATIVE DIAGNOSIS:  Secondary hyperparathyroidism and end-stage renal failure.  POSTOPERATIVE DIAGNOSIS:  Secondary hyperparathyroidism and end-stage renal failure.  OPERATION PERFORMED:  Total parathyroidectomy and autotransplantation of parathyroid tissue to the left forearm.  SURGEON:  Milus Mallick, M.D.  ASSISTANT:  Chevis Pretty, M.D.  ANESTHESIA:  General endotracheal.  DESCRIPTION OF PROCEDURE:  Under adequate general endotracheal anesthesia, the patients neck was extended on a shoulder roll.  It was then prepped and draped in the usual fashion for parathyroid surgery.  In doing this, the inferior portion of his chin and upper neck were shaved.   A low collar incision was made and carried down through the platysma muscle.  Bleeders were electrocoagulated.  Superior and inferior platysmal flaps were then developed using Bovie electrocoagulation.  The middle cervical fascia was exposed and opened longitudinally in the midline.  The strap muscles were then divided using Bovie electrocoagulation and at the superior end of each muscle.  The surgical plane of the thyroid gland was entered.  There was a good deal of scar tissue in the plane that was easily divided using Bovie electrocoagulation.  The left middle vein was divided over quadruple clip technique and this gave good mobility to the left lobe of the thyroid and it could be explored very well.  The left inferior gland was found in the superior mediastinum and dissected out by blunt and sharp dissection and excised over small Hemoclips.  It was 1.6 cm in length, 0.9 cm in width and 0.3 cm in thickness.  A portion was sent for frozen section study and  the remainder was saved in iced saline.  Next, the superior pole of the left lobe of the thyroid was explored and we found another enlarged parathyroid gland that was quite posterior.  It also was dissected out over small hemoclips and excised and it was 1.5 cm in length, 0.8 cm in width and 0.6 cm in thickness. A portion was sent for frozen section study and the remainder was saved in iced saline.  Having found two glands on the left side we then moved to the right side and found a significant right middle thyroid vein that was divided over quadruple clip technique.  When this was done, the thyroid lobe was retracted anteriorly and rotated medially.  A very large right superior gland was encountered.  It was dissected out by blunt dissection and had multiple venous tributaries that were clipped and divided.  It was excised and found to be 2.8 cm in length, 2.0 cm in width, and 1.1 in thickness.  A portion was sent for frozen section study which confirmed parathyroid tissue and the remainder was saved in iced saline.  We then found the right inferior gland in its normal position.  It was dissected out by blunt and sharp dissection and its blood supple was clipped.  It was 1.4 cm in length, 0.9 cm in width, and 0.7 cm in thickness.  It was multilobulated and quite firm.  A portion was sent for frozen section study which confirmed parathyroid tissue and the remainder saved in iced saline.  We  inspected the right superior anterior mediastinum and found no evidence of any glands in this area.  Having found four glands and inspected the anterior mediastinum on both sides, hemostasis was ascertained.  The strap muscles were repaired with interrupted sutures of 4-0 Vicryl.  The platysma muscle was also repaired similarly and the skin was approximated with the generic skin stapler 35-W.  The left forearm was prepped and draped in the usual fashion for autotransplantation.  A vertical incision was  made over the left brachioradialis muscle and carried down to it.  The fascia was exposed over an area of approximately 5 cm x 5 cm and the skin was retracted with self-retaining Weitlaner retractor.  Next the portion of the left inferior parathyroid gland was diced into 12 small pieces approximately 2 mm each in diameter.  Each one of these small pieces was then inserted into an intramuscular pocket.  Each portion was in an individual pocket which was made by incising the fascia and then repairing it with 4-0 Prolene sutures.  All 12 pieces were implanted.  Hemostasis was ascertained.  The skin was reapproximated with generic skin stapler 35-W and a sterile dressing was applied as well as an Ace wrap.  The patient tolerated the procedure well and left the operating room in satisfactory condition. DD:  06/02/00 TD:  06/03/00 Job: [id number]

## 2010-06-12 NOTE — Discharge Summary (Signed)
[name], [name]             ACCOUNT NO.:  [id number]   MEDICAL RECORD NO.:  [id number]          PATIENT TYPE:  INP   LOCATION:  6740                         FACILITY:  [facility]   PHYSICIAN:  Wilber Bihari. Caryn Section, M.D.   DATE OF BIRTH:  1950-06-23   DATE OF ADMISSION:  01/19/2007  DATE OF DISCHARGE:  01/22/2007                               DISCHARGE SUMMARY   ADMITTING DIAGNOSES:  1. Fever and diarrhea.  2. Chronic hypotension.  3. End-stage renal disease on chronic hemodialysis.  4. Obstructive sleep apnea.  5. Secondary hyperparathyroidism status post parathyroidectomy.  6. Status post new right upper arm AV Gore-Tex graft December 02, 2006.   DISCHARGE DIAGNOSIS:  1. Fever and diarrhea resolved, possibly gastroenteritis, all cultures      negative.  2. Status post thrombectomy and revision right upper arm AV Gore-Tex      graft January 21, 2007, Dr. Cari Caraway.  3. End-stage renal disease on chronic hemodialysis.  4. Chronic hypotension, stable.  5. Secondary hyperparathyroidism status post parathyroidectomy.  6. Obstructive sleep apnea.   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  He denies seeing any blood in the  diarrhea.  He denies syncope, loss of consciousness or seizures  otherwise.  He is being admitted for workup.   LABORATORY DATA:  Labs on admission, pH 7.37, white count 20,600,  hemoglobin 10.5, hematocrit 30.9, platelets 157,000, 92 segs, two  lymphocytes, six monocytes, zero eosinophils.  Sodium 135, potassium  4.7, chloride 102, glucose 90, BUN 49, creatinine 11.2.   HOSPITAL COURSE:  #1 - The patient was admitted, placed on his usual  medications, pancultured and given vancomycin and Fortaz  empirically.  IV fluids were started using normal saline.  Stool cultures were  negative blood cultures were also negative.  He remained afebrile.  His  antibiotics were stopped at time of discharge and not continued.  His  diarrhea resolved.  He had no vomiting, and he was tolerating a regular  diet.  The patient was felt that he possibly had a gastroenteritis and  will be discharged off antibiotics.   #2 - CLOTTED ACCESS.  The patient underwent thrombectomy and revision of  his right upper arm Gore-Tex graft on December 27.  He tolerated this  well.  His graft functioned well for dialysis postop.  Dr. Edilia Bo will  schedule a shuntogram and possible angioplasty 2 days after discharge  for suspicion of stenosis.  The intraoperative shuntogram showed  arterial anastomosis to be patent with the venous anastomoses and the  central veins unable to be assessed.  At the time of discharge the  patient's graft remains patent, and he has been given instructions to  return for shuntogram.   DISCHARGE MEDICATIONS:  1. Thorazine 10 mg t.i.d. p.r.n.  2. Midodrine 10 mg t.i.d. Protonix 40 mg daily.  3. Nephro-Vite one daily.  4. Aspirin  325 mg daily.  5. PhosLo 667 mg one with meals.  6. EPO 2000 units IV each dialysis.  7. Zemplar 1 mcg IV each dialysis.   DRY WEIGHT:  Remains at 125 kg.      Zenovia Jordan, P.A.      Richard F. Caryn Section, M.D.  Electronically Signed    RRK/MEDQ  D:  03/16/2007  T:  03/17/2007  Job:  [id number]

## 2010-06-12 NOTE — Discharge Summary (Signed)
[place]  Patient:    [name], [name]                    MRN: [id number] Adm. Date:  [id number] Disc. Date: [id number] Attending:  Gennie Alma CC:         Mindi Slicker. Lowell Guitar, M.D.   Discharge Summary  [place] Number:  [phone]  FINAL DIAGNOSES: 1. End-stage renal failure, status hemodialysis. 2. Secondary hyperparathyroidism. 3. Obesity. 4. History of mild chronic obstructive pulmonary disease.  HISTORY OF PRESENT ILLNESS:  This 60 year old male patient has had end-stage renal failure and has been on hemodialysis since that time on Monday, Wednesday, Friday.  He has developed secondary hyperparathyroidism.  He complained of severe bone pain in his knees and tibial region.  Physical examination was unremarkable except for moderate obesity and multiple scars of the left forearm and also the right forearm.  There was an active Gore-Tex graft AV fistula in the right arm.  HOSPITAL COURSE:  On the day of admission, the patient underwent a total parathyroidectomy and ordered transplantation of parathyroid tissue due to his left forearm.  He tolerated the procedure well and had an uneventful postoperative course, being discharged on the second postoperative day.  His voice was normal.  His swallowing function was normal.  His incisions were healing nicely and his serum calcium was well-controlled with Calcijex and his hemodialysate and also Rocaltrol.  CONDITION ON DISCHARGE:  Improved.  The patient said that his bone pain was gone.  MEDICATIONS ON DISCHARGE: 1. Nephro-Vite one daily. 2. Calcium 500 mg 4 tablets between meals and at bedtime 4 times a day. 3. Coated aspirin 325 mg daily. 4. EPO 8500 units in his dialysis. 5. Calcijex 4 mcg in his dialysis.  He was to continue dialysis on Monday, Wednesday, Friday.  DIET:  To be renal failure diet and limitation of fluids to 5 cups per day. DD:  06/14/00 TD:  06/15/00 Job:  [id number]

## 2010-06-12 NOTE — Op Note (Signed)
[facility]  Patient:    [name], [name]                    MRN: [id number] Proc. Date: 11/10/99 Adm. Date:  [id number] Attending:  Bennye Alm                           Operative Report  PROCEDURES: 1. Thrombectomy of right upper arm arteriovenous graft. 2. Insertion of new segment graft from proximal axillary vein. 3. Intraoperative fistulogram.  SURGEON:  Di Kindle. Edilia Bo, M.D.  ASSISTANT:  Nurse.  ANESTHESIA: Local with sedation.  TECHNIQUE:  The patient was taken to the operating room and sedated by anesthesia.  The entire right upper extremity was prepped and draped in the usual sterile fashion.  After the skin was infiltrated with 1% lidocaine, the venous incision was opened and venous limb of the graft dissected free. I dissected very high in the axilla to get to the more proximal axillary vein. I also looked for an adjacent brachial vein, and none were identified.  The patient was heparinized, and the graft was divided.  Graft thrombectomy was achieved using a #4 Fogarty catheter.  The arterial plug was removed and excellent end flow established.  The graft was flushed with heparinized saline and clamped.  Venous thrombectomy was performed.  There was minimal hyperplasia here; therefore, I excised the old venous anastomosis and went higher on the axillary vein.  A new segment of 6 mm PTFE graft was brought to the field, spatulated, and sewn end-to-side to the high axillary vein using continuous 6-0 Prolene suture.  The new segment of graft was then cut to the appropriate length and anastomosed end-to-end to the old graft using continuing 6-0 Prolene suture.  At the completion, there was excellent thrill in the graft.  Hemostasis was obtained in the wounds.  Intraoperative fistulogram was obtained which showed no tactical problem and no problems with the arterial anastomosis.  The wound was then closed with a deep layer  of 3-0 Vicryl, subcutaneous layer of 3-0 Vicryl, and skin closed with 4-0 subcuticular stitch.  A sterile dressing was applied.  The patient tolerated the procedure well and was transferred to the recovery room in satisfactory condition.  All needle and sponge counts were correct. DD:  11/10/99 TD:  11/10/99 Job: [id number]

## 2010-06-12 NOTE — Discharge Summary (Signed)
[place]  Patient:    [name], [name] Visit Number: [id number] MRN: [id number]          Service Type: SUR Location: [id number] Attending Physician:  Melvenia Needles Dictated by:   Karsten Ro, P.A.-C. Admit Date:  11/29/2000 Discharge Date: 12/01/2000   CC:         Denman George, M.D.   Discharge Summary  DATE OF BIRTH:  04/12/1950  DISCHARGE DIAGNOSES: 1. Hyperkalemia. 2. Nonfunctioning hemodialysis catheter. 3. Fever. 4. End-stage renal disease.  PROCEDURES:  By Denman George, M.D., right upper arteriovenous Gore-Tex graft thrombectomy and revision on December 01, 2000.  CONSULTS:  Denman George, M.D.  HISTORY OF PRESENT ILLNESS:  [name] is a 60 year old African-American male who presented to dialysis the day before admission and was found to have a clotted graft.  He was sent to [facility] to undergo a thrombectomy, but his potassium was found to be elevated in the 7s.  Despite Kayexalate, his potassium remained elevated and the surgery was canceled.  He underwent dialysis the night before admission via a femoral catheter.  The dialysis session was uneventful and he went home feeling fine.  He returned the day of admission for a thrombectomy and he complained of chills.  His temperature was 101 degrees Fahrenheit and he was hypotensive with a systolic blood pressure in the 70s.  His potassium was again elevated at 7.6.  He was admitted to receive IV antibiotics and work-up of the fever, as well as for the hyperkalemia.  PHYSICAL EXAMINATION:  Blood pressure 82/54, temperature 101 degrees, pulse 112.  HEART:  Slightly tachycardic, but regular.  EXTREMITIES:  The AV graft in the right upper arm had no bruit and no evidence of infection.  LABORATORY DATA:  WBC 19.7, RBC 3.94, hemoglobin 12.8, hematocrit 37.3, and platelets 219.  The sodium was 132, potassium 7.2, and glucose  113.  HOSPITAL COURSE: #1 - HYPERKALEMIA:  The patient was admitted on November 29, 2000.  He received vancomycin 2 g IV and tobramycin 200 mg IV.  A left subclavian catheter was attempted by Dyke Maes, M.D., and was unsuccessful.  Then a right femoral hemodialysis catheter was placed and the patient had hemodialysis. The patients blood pressure remained low during his dialysis treatment with systolics in the 70s-80s.  A dopamine drip was started.  The blood pressure then increased to a 98/54.  A potassium was checked the following day with a K 5.0 to 4.4 on November 30, 2000, to December 01, 2000.  Once the potassium was decreased and the patient was afebrile, the patient underwent a thrombectomy by P. Bud Face, M.D.  #2 - NONFUNCTIONING HEMODIALYSIS GRAFT, WHICH WAS A RIGHT UPPER EXTREMITY ARTERIOVENOUS GORE-TEX GRAFT:  The patient had a thrombectomy on December 01, 2000, by Denman George, M.D., without any further incident.  #3 - FEVER:  Blood cultures x 2 remained negative.  The vancomycin and the tobramycin were given empirically.  There was not an etiology found for these fevers.  #4 - END-STAGE RENAL DISEASE:  The patient had hemodialysis at the hospital on November 29, 2000, via right femoral Vas-Cath.  His permanent access, the right upper arteriovenous Gore-Tex graft, had a thrombectomy on December 01, 2000. The patients potassium was okay.  Therefore, he was discharged to home and will follow up with hemodialysis as an outpatient on his regular days.  DISPOSITION:  He was discharged to  home on December 01, 2000, with no changes his medications or in his hemodialysis prescriptions.  DIET:  Will remain on a renal diet with no changes.  FOLLOW-UP:  Resume previous dialysis schedule. Dictated by:   Karsten Ro, P.A.-C. Attending Physician:  Melvenia Needles DD:  12/16/00 TD:  12/18/00 Job: [id number]

## 2010-06-12 NOTE — Op Note (Signed)
[facility]  Patient:    [name], [name]                    MRN: [id number] Proc. Date: 05/24/00 Adm. Date:  [id number] Attending:  Bennye Alm                           Operative Report  PREOPERATIVE DIAGNOSES: 1. Occluded right upper arm arteriovenous graft. 2. Chronic renal failure.  POSTOPERATIVE DIAGNOSIS: 1. Occluded right upper arm arteriovenous graft. 2. Chronic renal failure.  OPERATION:  Thrombectomy of right upper arm arteriovenous graft.  SURGEON:  Di Kindle. Edilia Bo, M.D.  ASSISTANT:  Nurse.  ANESTHESIA:  General.  INDICATIONS:  This is a 60 year old gentleman who has a right upper arm A-V graft.  This was last worked on in October 2001.  At that time, it was noted we had to go very high in the axilla, and no further revisions were possible. It was felt that if this graft clotted, he needed to have a right thigh graft placed.   He comes in today and states that his blood pressure was low after dialysis, and he thinks that the graft clotted for this reason.  I recommended that we place an Ash catheter and then evaluate him for a leg graft in the future; however, the patient wanted to try one more thrombectomy on this graft as it had worked well for six months.  TECHNIQUE:  The patient was taken to the operating room and received a general anesthetic.  The right upper extremity was prepped and draped in the usual sterile fashion.  Incision was made over the venous anastomosis and the venous limb of the graft dissected free.  Transverse graftotomy was made after the patient was heparinized with 8000 units of IV heparin.  The graft thrombectomy was then performed using a #4 Fogarty catheter, and the arterial plug was retrieved and excellent inflow established.  The graft was flushed with heparin and saline and clamped.  Of note, there were no problems in pulling the catheter through the body of the graft.  Venous  thrombectomy was then performed, and there was excellent backbleeding.  The venous end easily took a 4.5 mm dilator.  This was flushed with heparinized saline and clamped.  The graft was then sewn back end-to-end using continuous 6-0 Prolene suture.  At the completion, there was a good thrill in the graft.  Hemostasis was obtained in the wound, and the wound was closed with a deep layer of 3-0 Vicryl and the skin closed with 4-0 Vicryl.  A sterile dressing was applied.  The patient tolerated the procedure well and was transferred to the recovery room in satisfactory condition.  All needle and sponge counts were correct. DD:  05/24/00 TD:  05/24/00 Job: [id number]

## 2010-06-12 NOTE — Op Note (Signed)
[place]. [facility]  Patient:    [name], [name] Visit Number: [id number] MRN: [id number]          Service Type: DSU Location: [facility] [id number] Attending Physician:  Bennye Alm Dictated by:   Di Kindle Edilia Bo, M.D. Proc. Date: 07/21/01 Admit Date:  07/21/2001 Discharge Date: 07/21/2001                             Operative Report  PREOPERATIVE DIAGNOSIS:  Chronic renal failure with clotted right upper arm arteriovenous graft.  POSTOPERATIVE DIAGNOSIS:  Chronic renal failure with clotted right upper arm arteriovenous graft.  PROCEDURES: 1. Thrombectomy of right upper arm arteriovenous graft with insertion of new    segment of graft to the more proximal axillary vein. 2. Intraoperative fistulogram.  SURGEON:  Di Kindle. Edilia Bo, M.D.  ANESTHESIA:  Adair Laundry, R.N.F.A.  ANESTHESIA:  Local with sedation.  DESCRIPTION OF PROCEDURE:  The patient was taken to the operating room and sedated by anesthesia.  The entire right upper extremity was prepped and draped in the usual sterile fashion.  After the skin was anesthetized with 1% lidocaine, the incision over the venous anastomosis was opened and the venous limb of the graft dissected free.  This was very high in the axilla.  I had to do very extensive dissection to get up above the previous venous anastomosis but was able to get a short segment of the vein out here.  Next the patient was heparinized with 9000 units of IV heparin.  The vein was clamped.  The old venous anastomosis was excised, and there was intimal hyperplasia present here.  The graft thrombectomy was achieved using a #4 Fogarty catheter.  The arterial plug was retrieved and excellent inflow established.  The graft was flushed with heparinized saline and clamped.  Next a new segment of 7 mm PTFE was brought to the field and sewn end-to-end to the more proximal vein using continuous 6-0 Prolene suture.  The  graft was then pulled to the appropriate length for anastomosis to the old graft, which was done with continuous 6-0 Prolene suture.  At the completion there was an excellent thrill in the graft. An intraoperative fistulogram was obtained, which showed no problems in the graft itself or with the arterial anastomosis.  Hemostasis was obtained in the wound, and the wound was closed with a deep layer of 3-0 Vicryl and the skin closed with 4-0 Vicryl.  A sterile dressing was applied.  The patient tolerated the procedure well and was transferred to the recovery room in satisfactory condition.  All needle and sponge counts were correct. Dictated by:   Di Kindle Edilia Bo, M.D. Attending Physician:  Bennye Alm DD:  07/21/01 TD:  07/24/01 Job: [id number]

## 2010-06-12 NOTE — H&P (Signed)
[place]. [facility]  Patient:    [name], [name]                    MRN: [id number] Adm. Date:  [id number] Attending:  Gennie Alma                         History and Physical  [place] [phone].  CHIEF COMPLAINT:  Secondary hyperparathyroidism.  HISTORY OF PRESENT ILLNESS:  This 60 year old male patient has had end-stage renal failure and has been on hemodialysis since that time on Mondays, Wednesdays, and Fridays.  He has developed secondary hyperparathyroidism.  He does not have any itching, but he does have severe bone pain in his knees and tibial region, and he does not have any fatigue.  His active access for his hemodialysis is in his right arm.  PAST MEDICAL HISTORY:  The patient has had significant obesity and mild COPD for many years.  There is no history of diabetes, heart disease, or high blood pressure.  MEDICATIONS:  Calcium carbonate 500 mg three t.i.d. with meals, Renagel 800 mg three t.i.d. with meals, Nephro-Vite one daily, and iron pills.  ALLERGIES:  No known drug allergies.  REVIEW OF SYSTEMS:  No recent fever, chills, night sweats, or weight loss. HEENT:  No headaches, dizzy spells, fainting spells, or convulsions.  ENT is negative.  CHEST:  No chest pain, hemoptysis, dyspnea, or wheezing. GASTROINTESTINAL:  No nausea, vomiting, diarrhea, or constipation. GENITOURINARY:  The patient has minimal urine output, but there is no dysuria.  PHYSICAL EXAMINATION:  GENERAL:  An obese black male who weighs 275 pounds.  SKIN:  Warm and dry, normal color, texture, and turgor.  HEENT:  Normocephalic.  Eyes:  Pupils equal, react to light and accommodation. No scleral icterus or conjunctival pallor.  Throat is clear.  Dentition good. Tongue normal.  NECK:  Trachea midline.  Thyroid within normal limits.  No cervical adenopathy.  CHEST:   Lungs clear to percussion and auscultation.  CARDIAC:  Regular rate and rhythm.   No murmurs, rubs, or gallops.  ABDOMEN:  Obese.  There are no masses, tenderness, or visceromegaly.  EXTREMITIES:  There are multiple scars of left forearm and arm and also on the right forearm and arm.  There is an active Gore-Tex graft in the right arm.  IMPRESSION: 1. End-stage renal failure, status hemodialysis. 2. Secondary hyperparathyroidism. 3. Obesity. 4. History of mild chronic obstructive pulmonary disease. DD:  06/02/00 TD:  06/02/00 Job: [id number]

## 2010-06-12 NOTE — Op Note (Signed)
[place]. [facility]  Patient:    [name], [name] Visit Number: [id number] MRN: [id number]          Service Type: SUR Location: [phone] Attending Physician:  Melvenia Needles Dictated by:   Denman George, M.D. Proc. Date: 12/01/00 Admit Date:  11/29/2000 Discharge Date: 12/01/2000                             Operative Report  SURGEON:  Denman George, M.D.  ASSISTANT:  Dominica Severin, P.A.  ANESTHESIA:  Local with MAC.  ANESTHESIOLOGIST:  Burna Forts, M.D.  PREOPERATIVE DIAGNOSES: 1. End-stage renal failure. 2. Clotted right upper arm arteriovenous graft.  POSTOPERATIVE DIAGNOSES: 1. End-stage renal failure. 2. Clotted right upper arm arteriovenous graft.  PROCEDURE:  Thrombectomy with revision, right upper arm arteriovenous graft.  CLINICAL NOTE:  This is a 60 year old black male with end-stage renal failure. He was admitted to the hospital recently with hyperkalemia.  His right upper arm arteriovenous graft was thrombosed.  He was brought to the operating room at this time for thrombectomy revision.  OPERATIVE PROCEDURE:  The patient was brought to the operating room in stable condition.  Placed in supine position.  The right arm was prepped and draped in a sterile fashion.  The skin and subcutaneous tissue in the right axilla was instilled with 1% Xylocaine.  A longitudinal skin incision was made through the scar.  Dissection was carried down to expose the venous limb of the graft.  This was followed down to an end-to-end anastomosis to the axillary vein.  The axillary vein was encircled with a vessel loop proximally and controlled with a baby Gregory clamp.  The venous anastomosis was taken down.  There was pseudointimal narrowing at the anastomosis.  The vein was trimmed proximally.  The graft was divided.  The graft was thrombectomized with a #5 Fogarty catheter.  Excellent inflow obtained.  The graft was filled with  heparin and saline solution and controlled with a fistula clamp.  A new segment of 6-mm Gore-Tex was then anastomosed end-to-end to the divided graft with a running 6-0 Prolene suture.  This was done and anastomosed end-to-end to the axillary vein with running 6-0 Prolene suture.  Clamps were then removed.  Excellent flow present.  Adequate hemostasis obtained.  Sponge, instrument counts correct.  Subcutaneous tissue was closed with two layers of running 3-0 Vicryl suture. The skin was closed with 4-0 Monocryl after which Steri-Strips were applied.  The patient tolerated the procedure well.  No apparent complications. Transferred to the recovery room in stable condition. Dictated by:   Denman George, M.D. Attending Physician:  Melvenia Needles DD:  12/01/00 TD:  12/03/00 Job: [id number]

## 2010-06-12 NOTE — Op Note (Signed)
NAME:  [name], [name]                       ACCOUNT NO.:  [id number]   MEDICAL RECORD NO.:  [id number]                   PATIENT TYPE:  OBV   LOCATION:  1824                                 FACILITY:  [facility]   PHYSICIAN:  Di Kindle. Edilia Bo, M.D.        DATE OF BIRTH:  September 26, 1950   DATE OF PROCEDURE:  05/27/2002  DATE OF DISCHARGE:                                 OPERATIVE REPORT   PREOPERATIVE DIAGNOSES:  Chronic renal failure with clotted right upper arm  arteriovenous graft.   POSTOPERATIVE DIAGNOSES:  Chronic renal failure with clotted right upper arm  arteriovenous graft.   PROCEDURE:  1. Thrombectomy, right upper arm arteriovenous graft.  2. Insertion of new segment of graft to the more proximal axillary vein.   SURGEON:  Di Kindle. Edilia Bo, M.D.   ASSISTANT:  Nurse.   ANESTHESIA:  Local with sedation.   TECHNIQUE:  Patient was taken to the operating room and sedated by  anesthesia.  The right upper extremity was prepped and draped in the usual  sterile fashion.  After the skin was infiltrated with 1% lidocaine, a  longitudinal incision was made over the axilla.  The venous __________ graft  was dissected free and we dissected up onto the axillary vein.  Of note,  this anastomosis was very high in the axilla.  However, I was able to get  slightly higher, where the vein did feel soft and was a good size vein.  The  patient was then heparinized.  The graft was divided and graft thrombectomy  achieved easily using #4 Fogarty catheter.  The arterial plug is retrieved  and excellent flow established through the graft.  The graft was flushed  with heparinized saline and clamped.  Next, a more proximal vein was clamped  and then divided.  A 7 mm PTFE was brought to the field and sewn end-to-end  to the vein using continuous 6-0 Prolene suture.  Thrombectomy was then  performed through the graft and there was no venous clot.  The graft was cut  to the appropriate  length and sewn end-to-end to the old graft, using  continuous 6-0 Prolene suture.  At the completion, there was an excellent  fill in the graft.  Hemostasis was obtained and the wound was closed in the  deep layer with 3-0 Vicryl and the skin closed with 4-0 Vicryl.  A sterile  dressing was applied.  The patient tolerated the procedure well and was  transferred to the recovery room in satisfactory condition.  All needle and  sponge counts were correct.  If this graft clots in the future, he will need  a right thigh graft and a catheter.                                               [name]  Adele Dan, M.D.    CSD/MEDQ  D:  05/27/2002  T:  05/28/2002  Job:  [id number]

## 2010-06-12 NOTE — H&P (Signed)
. [facility]  Patient:    CREW, GOREN Visit Number: [id number] MRN: [id number]          Service Type: DSU Location: [phone] Attending Physician:  Melvenia Needles Dictated by:   Dyke Maes, M.D. Admit Date:  11/29/2000                           History and Physical  REASON FOR ADMISSION:  Fevers, chills, and hyperkalemia.  HISTORY OF PRESENT ILLNESS:  This is a 60 year old black male who presented to dialysis yesterday and was found to have a clotted graft.  He was sent to [facility] to undergo a thrombectomy.  However, his potassium was found to be elevated in the 7s.  Despite Kayexalate his potassium remained elevated, and his surgery was cancelled.  He underwent dialysis last night via a femoral catheter.  The dialysis session was uneventful, and he went home feeling fine. He returned today for the thrombectomy and complained of chills.  A potassium level was again elevated at 7.6.  He was also found to have a temperature to 101 and was hypotensive, with systolic blood pressures in the 80s.  He is, thus, being admitted to receive IV antibiotics and for further evaluation.  PAST MEDICAL HISTORY: 1. 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 2. Possible history of sleep apnea. 3. Status post parathyroidectomy.  ALLERGIES:  None.  MEDICATIONS: 1. Os-Cal 4 t.i.d. with meals. 2. Enteric-coated aspirin 1 a day. 3. Nephro-Vite 1 a day.  SOCIAL HISTORY:  Nonsmoker, nondrinker.  FAMILY HISTORY:  Noncontributory.  REVIEW OF SYSTEMS:  No recent change in vision.  No shortness of breath.  No chest pains.  Chills as noted above.  No recent change in bowel habits.  No dysuria.  The rest of the review of systems is unremarkable.  PHYSICAL EXAMINATION:  VITAL SIGNS:  Blood pressure 82/54, temperature 101, pulse 112.  GENERAL:  Healthy-appearing 60 year old black male in mild distress.  HEENT:   Sclerae nonicteric.  Extraocular muscles are intact.  NECK:  No JVD.  No lymphadenopathy.  LUNGS:  Clear to auscultation.  HEART:  Slightly tachycardic but regular.  ABDOMEN:  Positive bowel sounds.  Nontender, nondistended.  No hepatosplenomegaly.  EXTREMITIES:  No clubbing, cyanosis, or edema.  There is an AV graft in his right upper arm with no bruit and no evidence of infection at the present time.  Femoral dialysis catheter site from last night shows no pus.  NEUROLOGIC:  No focal deficits.  Pulses 2/4 and equal throughout.  IMPRESSION: 1. Sepsis of uncertain source, possibly secondary to the femoral catheter    insertion last night. 2. Hyperkalemia. 3. End-stage renal disease.  PLAN: 1. Emergent hemodialysis today after femoral catheter is placed. 2. Will check urine cultures and blood cultures. 3. Empiric antibiotics. 4. Thrombectomy when stable. Dictated by:   Dyke Maes, M.D. Attending Physician:  Melvenia Needles DD:  11/29/00 TD:  11/30/00 Job: [id number]

## 2010-08-10 ENCOUNTER — Ambulatory Visit: Payer: Self-pay | Admitting: Vascular Surgery

## 2010-09-07 ENCOUNTER — Encounter: Payer: Self-pay | Admitting: Podiatry

## 2010-10-07 ENCOUNTER — Ambulatory Visit: Payer: Self-pay | Admitting: Vascular Surgery

## 2010-10-26 LAB — CBC
HCT: 29.6 — ABNORMAL LOW
MCHC: 33.9
MCV: 93.7
Platelets: 218
WBC: 7.4

## 2010-10-26 LAB — POCT I-STAT, CHEM 8
BUN: 54 — ABNORMAL HIGH
Calcium, Ion: 1.08 — ABNORMAL LOW
Creatinine, Ser: 10.5 — ABNORMAL HIGH
Glucose, Bld: 99
Hemoglobin: 12.2 — ABNORMAL LOW
TCO2: 31

## 2010-10-26 LAB — POCT I-STAT 4, (NA,K, GLUC, HGB,HCT)
Glucose, Bld: 92
HCT: 43
Hemoglobin: 10.9 — ABNORMAL LOW
Potassium: 6.4
Potassium: 6.5
Sodium: 133 — ABNORMAL LOW

## 2010-10-26 LAB — BASIC METABOLIC PANEL
BUN: 65 — ABNORMAL HIGH
CO2: 23
Chloride: 95 — ABNORMAL LOW
Potassium: 4.3

## 2010-10-26 LAB — POTASSIUM
Potassium: 5.6 — ABNORMAL HIGH
Potassium: 5.7 — ABNORMAL HIGH

## 2010-10-28 LAB — POTASSIUM: Potassium: 5.1

## 2010-10-30 LAB — I-STAT 8, (EC8 V) (CONVERTED LAB)
Acid-base deficit: 2
Acid-base deficit: 3 — ABNORMAL HIGH
Bicarbonate: 23
Bicarbonate: 23.2
Chloride: 102
Glucose, Bld: 81
HCT: 33 — ABNORMAL LOW
Hemoglobin: 11.2 — ABNORMAL LOW
Operator id: 257131
Potassium: 4.5
Sodium: 135
TCO2: 24
pCO2, Ven: 39.7 — ABNORMAL LOW
pCO2, Ven: 43.3 — ABNORMAL LOW
pH, Ven: 7.333 — ABNORMAL HIGH

## 2010-10-30 LAB — CBC
Hemoglobin: 10.5 — ABNORMAL LOW
MCHC: 33.9
MCV: 93.7
Platelets: 210
RBC: 3.11 — ABNORMAL LOW
RBC: 3.21 — ABNORMAL LOW
WBC: 20.6 — ABNORMAL HIGH
WBC: 4.9

## 2010-10-30 LAB — VALPROIC ACID LEVEL: Valproic Acid Lvl: <10 — ABNORMAL LOW

## 2010-10-30 LAB — APTT: aPTT: 30

## 2010-10-30 LAB — DIFFERENTIAL
Basophils Relative: 0
Eosinophils Relative: 0
Monocytes Absolute: 1.2 — ABNORMAL HIGH
Monocytes Relative: 6
Neutrophils Relative %: 92 — ABNORMAL HIGH
WBC Morphology: INCREASED

## 2010-10-30 LAB — CULTURE, BLOOD (ROUTINE X 2): Culture: NO GROWTH

## 2010-10-30 LAB — GIARDIA/CRYPTOSPORIDIUM SCREEN(EIA)
Cryptosporidium Screen (EIA): NEGATIVE
Giardia Screen - EIA: NEGATIVE

## 2010-10-30 LAB — COMPREHENSIVE METABOLIC PANEL
ALT: 22
AST: 26
Albumin: 2.8 — ABNORMAL LOW
Calcium: 8.1 — ABNORMAL LOW
Chloride: 98
Creatinine, Ser: 7.33 — ABNORMAL HIGH
GFR calc Af Amer: 9 — ABNORMAL LOW
Sodium: 139

## 2010-10-30 LAB — POCT I-STAT 4, (NA,K, GLUC, HGB,HCT)
Glucose, Bld: 76
HCT: 50
Potassium: 4.6

## 2010-10-30 LAB — STOOL CULTURE

## 2010-10-30 LAB — POCT I-STAT CREATININE: Creatinine, Ser: 11.2 — ABNORMAL HIGH

## 2010-10-30 LAB — POCT CARDIAC MARKERS: Troponin i, poc: <0.05

## 2010-11-03 LAB — POCT I-STAT 4, (NA,K, GLUC, HGB,HCT)
Glucose, Bld: 67 — ABNORMAL LOW
HCT: 38 — ABNORMAL LOW
HCT: 45
Hemoglobin: 15.3
Operator id: 274481
Potassium: 4.9

## 2010-11-24 ENCOUNTER — Ambulatory Visit: Payer: Self-pay | Admitting: Vascular Surgery

## 2011-02-18 ENCOUNTER — Encounter (INDEPENDENT_AMBULATORY_CARE_PROVIDER_SITE_OTHER): Payer: Self-pay | Admitting: General Surgery

## 2011-02-18 ENCOUNTER — Ambulatory Visit (INDEPENDENT_AMBULATORY_CARE_PROVIDER_SITE_OTHER): Payer: Medicare Other | Admitting: General Surgery

## 2011-02-18 VITALS — BP 124/82 | HR 72 | Temp 97.7°F | Resp 20 | Ht 69.0 in | Wt 273.8 lb

## 2011-02-18 DIAGNOSIS — K611 Rectal abscess: Secondary | ICD-10-CM

## 2011-02-18 DIAGNOSIS — K612 Anorectal abscess: Secondary | ICD-10-CM

## 2011-02-18 NOTE — Progress Notes (Signed)
Subjective:     Patient ID: [name], male   DOB: January 08, 1951, 61 y.o.   MRN: [id number]  HPI We're asked to the patient in consultation by Dr. Reynolds Bowl to evaluate him for a perirectal abscess. The patient is a 61 year old white male who is a renal failure patient who complains of a painful area near his bottom. The areas been there for about the last 6 months. The area is painful. At times the area gets larger and may drain. He has had some bloody discharge from it as well. He denies any fevers or chills. He does get dialysis on Monday Wednesdays and Fridays  Review of Systems  Constitutional: Negative.   HENT: Negative.   Eyes: Negative.   Respiratory: Negative.   Cardiovascular: Negative.   Gastrointestinal: Negative.   Genitourinary: Negative.   Musculoskeletal: Negative.   Skin: Negative.   Neurological: Negative.   Hematological: Negative.   Psychiatric/Behavioral: Negative.        Objective:   Physical Exam  Constitutional: He is oriented to person, place, and time. He appears well-developed and well-nourished.  HENT:  Head: Normocephalic and atraumatic.  Eyes: Conjunctivae and EOM are normal. Pupils are equal, round, and reactive to light.  Neck: Normal range of motion. Neck supple.  Cardiovascular: Normal rate, regular rhythm and normal heart sounds.   Pulmonary/Chest: Effort normal and breath sounds normal.  Abdominal: Soft. Bowel sounds are normal.  Genitourinary:       In the right anterior perirectal space the patient has an opening in the skin with some associated induration. There is no drainage that can be expressed from it today. There is no palpable cord heading towards the rectum.  Musculoskeletal: Normal range of motion.       Fistula in the right forearm  Neurological: He is alert and oriented to person, place, and time.  Skin: Skin is warm and dry.  Psychiatric: He has a normal mood and affect. His behavior is normal.       Assessment:     The  patient has what appears to be a chronic sort of perirectal abscess. I cannot appreciate a fistula today. I do think the patient needs an exam under anesthesia and debridement of the chronic tract. If it does connect to the rectum then he may need a seton as well.  I've discussed this with him in detail including the risks and benefits of the operation as well as some of the technical aspects and understand and wishes to proceed    Plan:     Plan for exam under anesthesia with debridement of chronic tract

## 2011-03-04 ENCOUNTER — Encounter (HOSPITAL_COMMUNITY): Payer: Self-pay | Admitting: Pharmacy Technician

## 2011-03-11 ENCOUNTER — Encounter (HOSPITAL_COMMUNITY)
Admission: RE | Admit: 2011-03-11 | Discharge: 2011-03-11 | Disposition: A | Discharge status: Home / Self Care | Payer: Medicare Other | Source: Ambulatory Visit | Attending: Anesthesiology | Admitting: Anesthesiology

## 2011-03-11 ENCOUNTER — Encounter (HOSPITAL_COMMUNITY)
Admission: RE | Admit: 2011-03-11 | Discharge: 2011-03-11 | Disposition: A | Discharge status: Home / Self Care | Payer: Medicare Other | Source: Ambulatory Visit | Attending: General Surgery | Admitting: General Surgery

## 2011-03-11 ENCOUNTER — Other Ambulatory Visit: Payer: Self-pay

## 2011-03-11 ENCOUNTER — Encounter (HOSPITAL_COMMUNITY): Payer: Self-pay

## 2011-03-11 HISTORY — DX: Anemia, unspecified: D64.9

## 2011-03-11 HISTORY — DX: Gastro-esophageal reflux disease without esophagitis: K21.9

## 2011-03-11 NOTE — Pre-Procedure Instructions (Signed)
20 [name]  03/11/2011   Your procedure is scheduled on:  *Thurs, Feb 21**  Report to [facility] at 0530 AM.  Call this number if you have problems the morning of surgery: [phone]   Remember:   Do not eat food:After Midnight.  May have clear liquids: up to 4 Hours before arrival.  Clear liquids include soda, tea, black coffee, apple or grape juice, broth.  Take these medicines the morning of surgery with A SIP OF WATER: **none*   Do not wear jewelry, make-up or nail polish.  Do not wear lotions, powders, or perfumes. You may wear deodorant.  Do not shave 48 hours prior to surgery.  Do not bring valuables to the hospital.  Contacts, dentures or bridgework may not be worn into surgery.  Leave suitcase in the car. After surgery it may be brought to your room.  For patients admitted to the hospital, checkout time is 11:00 AM the day of discharge.   Patients discharged the day of surgery will not be allowed to drive home.  Name and phone number of your driver: *sister**  Special Instructions: CHG Shower Use Special Wash: 1/2 bottle night before surgery and 1/2 bottle morning of surgery.   Please read over the following fact sheets that you were given: Pain Booklet, Coughing and Deep Breathing, MRSA Information and Surgical Site Infection Prevention

## 2011-03-12 ENCOUNTER — Encounter (HOSPITAL_COMMUNITY): Payer: Self-pay | Admitting: Vascular Surgery

## 2011-03-12 NOTE — Consult Note (Signed)
Anesthesia:  Patient is a 60 year old male scheduled for an I&D of perirectal abscess under anesthesia for 03/18/11.  History includes ESRD on HD, GERD, former smoker, anemia.  EKG shows NSR, LAFB, not felt significantly changed since at least 2010.  He has been evaluated by Cardiologist Dr. Allyson Sabal in the past, most recently in March 2011.  In 2005 he was diagnosed with non-ischemic CM.  EF was 20% at that time and his cath showed normal coronaries.  His most recent echo, however, on 04/30/09 showed mild LVH, findings c/w mildly impaired diastolic function, mildly dilated LA, no significant valvular abnormalities, EF >55%.  CXR shows no acute cardiopulmonary process.     He is for labs on the day of surgery.

## 2011-03-17 ENCOUNTER — Other Ambulatory Visit (INDEPENDENT_AMBULATORY_CARE_PROVIDER_SITE_OTHER): Payer: Self-pay | Admitting: General Surgery

## 2011-03-18 ENCOUNTER — Ambulatory Visit (HOSPITAL_COMMUNITY): Payer: Medicare Other | Admitting: Vascular Surgery

## 2011-03-18 ENCOUNTER — Encounter (HOSPITAL_COMMUNITY): Payer: Self-pay | Admitting: Vascular Surgery

## 2011-03-18 ENCOUNTER — Encounter (HOSPITAL_COMMUNITY): Payer: Self-pay | Admitting: Surgery

## 2011-03-18 ENCOUNTER — Ambulatory Visit (HOSPITAL_COMMUNITY)
Admission: RE | Admit: 2011-03-18 | Discharge: 2011-03-18 | Disposition: A | Discharge status: Home / Self Care | Payer: Medicare Other | Source: Ambulatory Visit | Attending: General Surgery | Admitting: General Surgery

## 2011-03-18 ENCOUNTER — Encounter (HOSPITAL_COMMUNITY)
Admission: RE | Disposition: A | Discharge status: Home / Self Care | Payer: Self-pay | Source: Ambulatory Visit | Attending: General Surgery

## 2011-03-18 DIAGNOSIS — I120 Hypertensive chronic kidney disease with stage 5 chronic kidney disease or end stage renal disease: Secondary | ICD-10-CM | POA: Insufficient documentation

## 2011-03-18 DIAGNOSIS — K612 Anorectal abscess: Secondary | ICD-10-CM

## 2011-03-18 DIAGNOSIS — Z992 Dependence on renal dialysis: Secondary | ICD-10-CM | POA: Insufficient documentation

## 2011-03-18 DIAGNOSIS — Z01810 Encounter for preprocedural cardiovascular examination: Secondary | ICD-10-CM | POA: Insufficient documentation

## 2011-03-18 DIAGNOSIS — J449 Chronic obstructive pulmonary disease, unspecified: Secondary | ICD-10-CM | POA: Insufficient documentation

## 2011-03-18 DIAGNOSIS — K219 Gastro-esophageal reflux disease without esophagitis: Secondary | ICD-10-CM | POA: Insufficient documentation

## 2011-03-18 DIAGNOSIS — J4489 Other specified chronic obstructive pulmonary disease: Secondary | ICD-10-CM | POA: Insufficient documentation

## 2011-03-18 DIAGNOSIS — K611 Rectal abscess: Secondary | ICD-10-CM

## 2011-03-18 DIAGNOSIS — Z01812 Encounter for preprocedural laboratory examination: Secondary | ICD-10-CM | POA: Insufficient documentation

## 2011-03-18 DIAGNOSIS — Z01818 Encounter for other preprocedural examination: Secondary | ICD-10-CM | POA: Insufficient documentation

## 2011-03-18 DIAGNOSIS — N186 End stage renal disease: Secondary | ICD-10-CM | POA: Insufficient documentation

## 2011-03-18 HISTORY — PX: EXAMINATION UNDER ANESTHESIA: SHX1540

## 2011-03-18 LAB — CBC
HCT: 38.7 % — ABNORMAL LOW (ref 39.0–52.0)
MCHC: 32.3 g/dL (ref 30.0–36.0)
MCV: 91.9 fL (ref 78.0–100.0)
RDW: 15.5 % (ref 11.5–15.5)

## 2011-03-18 LAB — BASIC METABOLIC PANEL
BUN: 25 mg/dL — ABNORMAL HIGH (ref 6–23)
CO2: 28 mEq/L (ref 19–32)
Chloride: 96 mEq/L (ref 96–112)
Creatinine, Ser: 7.46 mg/dL — ABNORMAL HIGH (ref 0.50–1.35)
Glucose, Bld: 87 mg/dL (ref 70–99)

## 2011-03-18 SURGERY — EXAM UNDER ANESTHESIA
Anesthesia: General | Wound class: Dirty or Infected

## 2011-03-18 MED ORDER — ALBUMIN HUMAN 5 % IV SOLN
12.5000 g | Freq: Once | INTRAVENOUS | Status: AC
  Administered 2011-03-18: 12.5 g via INTRAVENOUS

## 2011-03-18 MED ORDER — PHENYLEPHRINE HCL 10 MG/ML IJ SOLN
INTRAMUSCULAR | Status: DC | PRN
  Administered 2011-03-18 (×2): 120 ug via INTRAVENOUS
  Administered 2011-03-18: 80 ug via INTRAVENOUS

## 2011-03-18 MED ORDER — PHENYLEPHRINE HCL 10 MG/ML IJ SOLN
10.0000 mg | INTRAVENOUS | Status: DC | PRN
  Administered 2011-03-18: 10 ug/min via INTRAVENOUS

## 2011-03-18 MED ORDER — GLYCOPYRROLATE 0.2 MG/ML IJ SOLN
INTRAMUSCULAR | Status: DC | PRN
  Administered 2011-03-18: .4 mg via INTRAVENOUS

## 2011-03-18 MED ORDER — ROCURONIUM BROMIDE 100 MG/10ML IV SOLN
INTRAVENOUS | Status: DC | PRN
  Administered 2011-03-18: 30 mg via INTRAVENOUS

## 2011-03-18 MED ORDER — ONDANSETRON HCL 4 MG/2ML IJ SOLN: 4.0000 mg | Freq: Once | INTRAMUSCULAR | Status: DC | PRN

## 2011-03-18 MED ORDER — HYDROMORPHONE HCL PF 1 MG/ML IJ SOLN: 0.2500 mg | INTRAMUSCULAR | Status: DC | PRN

## 2011-03-18 MED ORDER — HYDROCODONE-ACETAMINOPHEN 5-325 MG PO TABS: 1.0000 | ORAL_TABLET | Freq: Four times a day (QID) | ORAL | Status: AC | PRN

## 2011-03-18 MED ORDER — NEOSTIGMINE METHYLSULFATE 1 MG/ML IJ SOLN
INTRAMUSCULAR | Status: DC | PRN
  Administered 2011-03-18: 3 mg via INTRAVENOUS

## 2011-03-18 MED ORDER — BUPIVACAINE LIPOSOME 1.3 % IJ SUSP
INTRAMUSCULAR | Status: DC | PRN
  Administered 2011-03-18: 20 mL

## 2011-03-18 MED ORDER — BUPIVACAINE-EPINEPHRINE 0.25% -1:200000 IJ SOLN
INTRAMUSCULAR | Status: DC | PRN
  Administered 2011-03-18: 10 mL

## 2011-03-18 MED ORDER — SODIUM CHLORIDE 0.9 % IV SOLN
INTRAVENOUS | Status: DC | PRN
  Administered 2011-03-18: 07:00:00 via INTRAVENOUS

## 2011-03-18 MED ORDER — BUPIVACAINE LIPOSOME 1.3 % IJ SUSP
20.0000 mL | Freq: Once | INTRAMUSCULAR | Status: DC
  Filled 2011-03-18: qty 20

## 2011-03-18 MED ORDER — FENTANYL CITRATE 0.05 MG/ML IJ SOLN
INTRAMUSCULAR | Status: DC | PRN
  Administered 2011-03-18 (×3): 50 ug via INTRAVENOUS

## 2011-03-18 MED ORDER — PROPOFOL 10 MG/ML IV EMUL
INTRAVENOUS | Status: DC | PRN
  Administered 2011-03-18: 180 mg via INTRAVENOUS

## 2011-03-18 MED ORDER — FLEET ENEMA 7-19 GM/118ML RE ENEM: 1.0000 | ENEMA | Freq: Once | RECTAL | Status: DC

## 2011-03-18 MED ORDER — 0.9 % SODIUM CHLORIDE (POUR BTL) OPTIME
TOPICAL | Status: DC | PRN
  Administered 2011-03-18: 1000 mL

## 2011-03-18 MED ORDER — DEXTROSE 5 % IV SOLN
2.0000 g | INTRAVENOUS | Status: AC
  Administered 2011-03-18: 2 g via INTRAVENOUS
  Filled 2011-03-18: qty 2

## 2011-03-18 MED ORDER — ONDANSETRON HCL 4 MG/2ML IJ SOLN
INTRAMUSCULAR | Status: DC | PRN
  Administered 2011-03-18: 4 mg via INTRAVENOUS

## 2011-03-18 SURGICAL SUPPLY — 32 items
BANDAGE GAUZE ELAST BULKY 4 IN (GAUZE/BANDAGES/DRESSINGS) IMPLANT
CANISTER SUCTION 2500CC (MISCELLANEOUS) ×2 IMPLANT
CLOTH BEACON ORANGE TIMEOUT ST (SAFETY) ×2 IMPLANT
COVER SURGICAL LIGHT HANDLE (MISCELLANEOUS) ×2 IMPLANT
DRAPE LAPAROSCOPIC ABDOMINAL (DRAPES) ×2 IMPLANT
DRAPE PROXIMA HALF (DRAPES) ×2 IMPLANT
DRAPE UTILITY 15X26 W/TAPE STR (DRAPE) ×4 IMPLANT
DRSG PAD ABDOMINAL 8X10 ST (GAUZE/BANDAGES/DRESSINGS) IMPLANT
ELECT CAUTERY BLADE 6.4 (BLADE) ×2 IMPLANT
ELECT REM PT RETURN 9FT ADLT (ELECTROSURGICAL) ×2
ELECTRODE REM PT RTRN 9FT ADLT (ELECTROSURGICAL) ×1 IMPLANT
GAUZE SPONGE 4X4 12PLY STRL LF (GAUZE/BANDAGES/DRESSINGS) ×2 IMPLANT
GLOVE BIO SURGEON STRL SZ7.5 (GLOVE) ×2 IMPLANT
GLOVE BIOGEL PI IND STRL 7.0 (GLOVE) ×1 IMPLANT
GLOVE BIOGEL PI INDICATOR 7.0 (GLOVE) ×1
GLOVE SURG SS PI 6.5 STRL IVOR (GLOVE) ×2 IMPLANT
GOWN STRL NON-REIN LRG LVL3 (GOWN DISPOSABLE) ×4 IMPLANT
KIT BASIN OR (CUSTOM PROCEDURE TRAY) ×2 IMPLANT
KIT ROOM TURNOVER OR (KITS) ×2 IMPLANT
LEGGING LITHOTOMY PAIR STRL (DRAPES) ×2 IMPLANT
NEEDLE HYPO 25GX1X1/2 BEV (NEEDLE) ×2 IMPLANT
NS IRRIG 1000ML POUR BTL (IV SOLUTION) ×2 IMPLANT
PACK GENERAL/GYN (CUSTOM PROCEDURE TRAY) ×2 IMPLANT
PAD ARMBOARD 7.5X6 YLW CONV (MISCELLANEOUS) ×4 IMPLANT
SPECIMEN JAR SMALL (MISCELLANEOUS) IMPLANT
SPONGE GAUZE 4X4 12PLY (GAUZE/BANDAGES/DRESSINGS) IMPLANT
SWAB COLLECTION DEVICE MRSA (MISCELLANEOUS) IMPLANT
SYR CONTROL 10ML LL (SYRINGE) ×2 IMPLANT
TAPE CLOTH SURG 6X10 WHT LF (GAUZE/BANDAGES/DRESSINGS) ×2 IMPLANT
TOWEL OR 17X24 6PK STRL BLUE (TOWEL DISPOSABLE) ×2 IMPLANT
TOWEL OR 17X26 10 PK STRL BLUE (TOWEL DISPOSABLE) ×2 IMPLANT
TUBE ANAEROBIC SPECIMEN COL (MISCELLANEOUS) IMPLANT

## 2011-03-18 NOTE — Anesthesia Preprocedure Evaluation (Addendum)
Anesthesia Evaluation  Patient identified by MRN, date of birth, ID band Patient awake    Reviewed: Allergy & Precautions, H&P , NPO status , Patient's Chart, lab work & pertinent test results  Airway Mallampati: II TM Distance: >3 FB     Dental  (+) Poor Dentition   Pulmonary asthma , COPD         Cardiovascular Exercise Tolerance: Poor hypertension, regular Normal    Neuro/Psych    GI/Hepatic GERD-  ,  Endo/Other  Morbid obesity  Renal/GU CRF, ESRF and Dialysis     Musculoskeletal   Abdominal   Peds  Hematology   Anesthesia Other Findings   Reproductive/Obstetrics                          Anesthesia Physical Anesthesia Plan  ASA: III  Anesthesia Plan: General ETT   Post-op Pain Management:    Induction: Intravenous  Airway Management Planned: Oral ETT and LMA  Additional Equipment:   Intra-op Plan:   Post-operative Plan: Extubation in OR  Informed Consent: I have reviewed the patients History and Physical, chart, labs and discussed the procedure including the risks, benefits and alternatives for the proposed anesthesia with the patient or authorized representative who has indicated his/her understanding and acceptance.     Plan Discussed with: Anesthesiologist, CRNA and Surgeon  Anesthesia Plan Comments:         Anesthesia Quick Evaluation

## 2011-03-18 NOTE — Anesthesia Postprocedure Evaluation (Signed)
  Anesthesia Post-op Note  Patient: ALIAS VILLAGRAN  Procedure(s) Performed: Procedure(s) (LRB): EXAM UNDER ANESTHESIA (N/A) INCISION AND DRAINAGE ABSCESS (N/A)  Patient Location: PACU  Anesthesia Type: General  Level of Consciousness: awake, alert , oriented and patient cooperative  Airway and Oxygen Therapy: Patient Spontanous Breathing and Patient connected to nasal cannula oxygen  Post-op Pain: mild  Post-op Assessment: Post-op Vital signs reviewed, Patient's Cardiovascular Status Stable, Respiratory Function Stable, Patent Airway, No signs of Nausea or vomiting and Pain level controlled  Post-op Vital Signs: stable  Complications: No apparent anesthesia complications

## 2011-03-18 NOTE — Transfer of Care (Signed)
Immediate Anesthesia Transfer of Care Note  Patient: [name]  Procedure(s) Performed: Procedure(s) (LRB): EXAM UNDER ANESTHESIA (N/A) INCISION AND DRAINAGE ABSCESS (N/A)  Patient Location: PACU  Anesthesia Type: General  Level of Consciousness: awake, alert  and oriented  Airway & Oxygen Therapy: Patient Spontanous Breathing and Patient connected to face mask oxygen  Post-op Assessment: Report given to PACU RN, Post -op Vital signs reviewed and stable and Patient moving all extremities  Post vital signs: Reviewed and stable  Complications: No apparent anesthesia complications

## 2011-03-18 NOTE — Op Note (Signed)
03/18/2011  8:16 AM  PATIENT:  [name]  61 y.o. male  PRE-OPERATIVE DIAGNOSIS:  chronic perirectal abscess  POST-OPERATIVE DIAGNOSIS:  chronic perirectal abscess  PROCEDURE:  Procedure(s) (LRB): EXAM UNDER ANESTHESIA (N/A) INCISION AND DRAINAGE ABSCESS (N/A)  SURGEON:  Surgeon(s) and Role:    * Robyne Askew, MD - Primary  PHYSICIAN ASSISTANT:   ASSISTANTS: none   ANESTHESIA:   general  EBL:     BLOOD ADMINISTERED:none  DRAINS: none   LOCAL MEDICATIONS USED:  MARCAINE     SPECIMEN:  Source of Specimen:  chronic perirectal sinus tract  DISPOSITION OF SPECIMEN:  PATHOLOGY  COUNTS:  YES  TOURNIQUET:  * No tourniquets in log *  DICTATION: .Dragon Dictation After informed consent was obtained the patient was brought to the operating room and placed in the supine position on the operating room table. After adequate induction of general anesthesia the patient was moved in the lithotomy position. His perirectal area was prepped with Betadine and draped in usual sterile manner. In the right anterior perirectal space the patient had a opening in the skin. This area was probed with a silver probe and the sinus tract actually extended laterally away from the rectum. There was no evidence of tracking towards the rectum. The sinus tract was then excised sharply using electrocautery back to healthy appearing tissue. The wound was infiltrated with quarter percent Marcaine with epinephrine as well as with exparel . The wound was then packed with moistened 4 x 4 gauze. Sterile dressings were applied. Hemostasis was achieved using the Bovie electrocautery. The patient tolerated the procedure well. At the end of the case the needle sponge and instrument counts were correct. The patient was then awakened and taken to recovery in stable condition.  PLAN OF CARE: Discharge to home after PACU  PATIENT DISPOSITION:  PACU - hemodynamically stable.   Delay start of Pharmacological VTE  agent (>24hrs) due to surgical blood loss or risk of bleeding: not applicable

## 2011-03-18 NOTE — Interval H&P Note (Signed)
History and Physical Interval Note:  03/18/2011 7:07 AM  [name]  has presented today for surgery, with the diagnosis of chronic perirectal abscess  The various methods of treatment have been discussed with the patient and family. After consideration of risks, benefits and other options for treatment, the patient has consented to  Procedure(s) (LRB): EXAM UNDER ANESTHESIA (N/A) INCISION AND DRAINAGE ABSCESS (N/A) as a surgical intervention .  The patients' history has been reviewed, patient examined, no change in status, stable for surgery.  I have reviewed the patients' chart and labs.  Questions were answered to the patient's satisfaction.     TOTH III,Tessia Kassin S

## 2011-03-18 NOTE — Preoperative (Signed)
Beta Blockers   Reason not to administer Beta Blockers:Not Applicable 

## 2011-03-18 NOTE — H&P (View-Only) (Signed)
Subjective:     Patient ID: [name], male   DOB: 09/01/1950, 60 y.o.   MRN: [id number]  HPI We're asked to the patient in consultation by Dr. Coldonato to evaluate him for a perirectal abscess. The patient is a 60-year-old white male who is a renal failure patient who complains of a painful area near his bottom. The areas been there for about the last 6 months. The area is painful. At times the area gets larger and may drain. He has had some bloody discharge from it as well. He denies any fevers or chills. He does get dialysis on Monday Wednesdays and Fridays  Review of Systems  Constitutional: Negative.   HENT: Negative.   Eyes: Negative.   Respiratory: Negative.   Cardiovascular: Negative.   Gastrointestinal: Negative.   Genitourinary: Negative.   Musculoskeletal: Negative.   Skin: Negative.   Neurological: Negative.   Hematological: Negative.   Psychiatric/Behavioral: Negative.        Objective:   Physical Exam  Constitutional: He is oriented to person, place, and time. He appears well-developed and well-nourished.  HENT:  Head: Normocephalic and atraumatic.  Eyes: Conjunctivae and EOM are normal. Pupils are equal, round, and reactive to light.  Neck: Normal range of motion. Neck supple.  Cardiovascular: Normal rate, regular rhythm and normal heart sounds.   Pulmonary/Chest: Effort normal and breath sounds normal.  Abdominal: Soft. Bowel sounds are normal.  Genitourinary:       In the right anterior perirectal space the patient has an opening in the skin with some associated induration. There is no drainage that can be expressed from it today. There is no palpable cord heading towards the rectum.  Musculoskeletal: Normal range of motion.       Fistula in the right forearm  Neurological: He is alert and oriented to person, place, and time.  Skin: Skin is warm and dry.  Psychiatric: He has a normal mood and affect. His behavior is normal.       Assessment:     The  patient has what appears to be a chronic sort of perirectal abscess. I cannot appreciate a fistula today. I do think the patient needs an exam under anesthesia and debridement of the chronic tract. If it does connect to the rectum then he may need a seton as well.  I've discussed this with him in detail including the risks and benefits of the operation as well as some of the technical aspects and understand and wishes to proceed    Plan:     Plan for exam under anesthesia with debridement of chronic tract      

## 2011-03-19 ENCOUNTER — Encounter (HOSPITAL_COMMUNITY): Payer: Self-pay | Admitting: General Surgery

## 2011-04-06 ENCOUNTER — Encounter (INDEPENDENT_AMBULATORY_CARE_PROVIDER_SITE_OTHER): Payer: Self-pay | Admitting: General Surgery

## 2011-04-06 ENCOUNTER — Ambulatory Visit (INDEPENDENT_AMBULATORY_CARE_PROVIDER_SITE_OTHER): Payer: Medicare Other | Admitting: General Surgery

## 2011-04-06 VITALS — BP 118/70 | HR 68 | Temp 97.8°F | Resp 16 | Ht 69.0 in | Wt 271.8 lb

## 2011-04-06 DIAGNOSIS — K611 Rectal abscess: Secondary | ICD-10-CM

## 2011-04-06 DIAGNOSIS — K612 Anorectal abscess: Secondary | ICD-10-CM

## 2011-04-06 NOTE — Progress Notes (Signed)
Subjective:     Patient ID: [name], male   DOB: 1950-09-25, 61 y.o.   MRN: [id number]  HPI The patient is a 61 year old white male who is about 2 weeks out from an incision and drainage of a perirectal abscess. There was no evidence of anal fistula. Unfortunately he has not been getting wound care help at his home. He has noticed a small bit of drainage from the wound. He denies any fevers. His discomfort is improving.  Review of Systems     Objective:   Physical Exam On exam the wound is open and very clean with good granulation tissue.    Assessment:     Status post incision and drainage of perirectal abscess    Plan:     At this point I think the wound will heal quicker if we can get some home health nursing to do daily dressing changes with him. We will try to arrange for this. I would like him to continue to shower daily. We will plan to see him back in one month to check the wound.

## 2011-04-06 NOTE — Patient Instructions (Signed)
He needs home health nursing to do daily wet to dry gauze dressing changes

## 2011-04-07 ENCOUNTER — Telehealth (INDEPENDENT_AMBULATORY_CARE_PROVIDER_SITE_OTHER): Payer: Self-pay

## 2011-04-07 NOTE — Telephone Encounter (Addendum)
Message left for [facility] to start home visits a soon as possible.   I called back and [facility] will start visits on 04/08/2011.  Patient aware and advised to call us back if they do not.

## 2011-05-18 ENCOUNTER — Encounter (INDEPENDENT_AMBULATORY_CARE_PROVIDER_SITE_OTHER): Payer: Self-pay | Admitting: General Surgery

## 2011-05-18 ENCOUNTER — Encounter (INDEPENDENT_AMBULATORY_CARE_PROVIDER_SITE_OTHER): Payer: Medicare Other | Admitting: General Surgery

## 2011-05-25 ENCOUNTER — Ambulatory Visit (INDEPENDENT_AMBULATORY_CARE_PROVIDER_SITE_OTHER): Payer: Medicare Other | Admitting: General Surgery

## 2011-05-25 ENCOUNTER — Encounter (INDEPENDENT_AMBULATORY_CARE_PROVIDER_SITE_OTHER): Payer: Self-pay | Admitting: General Surgery

## 2011-05-25 VITALS — BP 118/74 | HR 80 | Resp 16 | Ht 69.0 in | Wt 268.0 lb

## 2011-05-25 DIAGNOSIS — K612 Anorectal abscess: Secondary | ICD-10-CM

## 2011-05-25 DIAGNOSIS — K611 Rectal abscess: Secondary | ICD-10-CM

## 2011-05-25 NOTE — Patient Instructions (Signed)
All healed. May return to all normal activities

## 2011-05-31 NOTE — Progress Notes (Signed)
Subjective:     Patient ID: [name], male   DOB: [birth year], 61 y.o.   MRN: [id number]  HPI The patient is a 61 year old black male who is several months out from an incision and drainage of a right buttock abscess. He currently denies any pain. He feels the wound is healed. He's had no drainage. He is back to all his normal activities  Review of Systems     Objective:   Physical Exam On exam the right buttock wound is completely healed. There is no sign of infection there    Assessment:     Status post incision and drainage of right buttock abscess    Plan:     At this point I think he can return to his normal activities without any restrictions. We will plan to see him back on a p.r.n. basis

## 2011-06-22 ENCOUNTER — Ambulatory Visit: Payer: Self-pay | Admitting: Vascular Surgery

## 2011-11-11 ENCOUNTER — Ambulatory Visit: Payer: Self-pay | Admitting: Vascular Surgery

## 2011-12-20 ENCOUNTER — Emergency Department (HOSPITAL_COMMUNITY)
Admission: EM | Admit: 2011-12-20 | Discharge: 2011-12-20 | Disposition: A | Discharge status: Home / Self Care | Payer: Medicare Other | Attending: Emergency Medicine | Admitting: Emergency Medicine

## 2011-12-20 ENCOUNTER — Encounter (HOSPITAL_COMMUNITY): Payer: Self-pay | Admitting: *Deleted

## 2011-12-20 DIAGNOSIS — Z862 Personal history of diseases of the blood and blood-forming organs and certain disorders involving the immune mechanism: Secondary | ICD-10-CM | POA: Insufficient documentation

## 2011-12-20 DIAGNOSIS — E213 Hyperparathyroidism, unspecified: Secondary | ICD-10-CM | POA: Insufficient documentation

## 2011-12-20 DIAGNOSIS — S161XXA Strain of muscle, fascia and tendon at neck level, initial encounter: Secondary | ICD-10-CM

## 2011-12-20 DIAGNOSIS — Y92410 Unspecified street and highway as the place of occurrence of the external cause: Secondary | ICD-10-CM | POA: Insufficient documentation

## 2011-12-20 DIAGNOSIS — J449 Chronic obstructive pulmonary disease, unspecified: Secondary | ICD-10-CM | POA: Insufficient documentation

## 2011-12-20 DIAGNOSIS — N186 End stage renal disease: Secondary | ICD-10-CM | POA: Insufficient documentation

## 2011-12-20 DIAGNOSIS — Z8679 Personal history of other diseases of the circulatory system: Secondary | ICD-10-CM | POA: Insufficient documentation

## 2011-12-20 DIAGNOSIS — Z7982 Long term (current) use of aspirin: Secondary | ICD-10-CM | POA: Insufficient documentation

## 2011-12-20 DIAGNOSIS — J4489 Other specified chronic obstructive pulmonary disease: Secondary | ICD-10-CM | POA: Insufficient documentation

## 2011-12-20 DIAGNOSIS — S139XXA Sprain of joints and ligaments of unspecified parts of neck, initial encounter: Secondary | ICD-10-CM | POA: Insufficient documentation

## 2011-12-20 DIAGNOSIS — Z79899 Other long term (current) drug therapy: Secondary | ICD-10-CM | POA: Insufficient documentation

## 2011-12-20 DIAGNOSIS — Z992 Dependence on renal dialysis: Secondary | ICD-10-CM | POA: Insufficient documentation

## 2011-12-20 DIAGNOSIS — Z8719 Personal history of other diseases of the digestive system: Secondary | ICD-10-CM | POA: Insufficient documentation

## 2011-12-20 DIAGNOSIS — Y9389 Activity, other specified: Secondary | ICD-10-CM | POA: Insufficient documentation

## 2011-12-20 DIAGNOSIS — Z87891 Personal history of nicotine dependence: Secondary | ICD-10-CM | POA: Insufficient documentation

## 2011-12-20 MED ORDER — OXYCODONE-ACETAMINOPHEN 5-325 MG PO TABS
1.0000 | ORAL_TABLET | Freq: Four times a day (QID) | ORAL | Status: DC | PRN
Start: 2011-12-20 — End: 2014-01-15
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
## 2012-01-21 ENCOUNTER — Other Ambulatory Visit (HOSPITAL_COMMUNITY): Payer: Self-pay | Admitting: Chiropractic Medicine

## 2012-01-21 DIAGNOSIS — M5020 Other cervical disc displacement, unspecified cervical region: Secondary | ICD-10-CM

## 2012-01-24 ENCOUNTER — Ambulatory Visit (HOSPITAL_COMMUNITY)
Admission: RE | Admit: 2012-01-24 | Discharge: 2012-01-24 | Disposition: A | Discharge status: Home / Self Care | Payer: Medicare Other | Source: Ambulatory Visit | Attending: Chiropractic Medicine | Admitting: Chiropractic Medicine

## 2012-01-24 DIAGNOSIS — M5020 Other cervical disc displacement, unspecified cervical region: Secondary | ICD-10-CM | POA: Insufficient documentation

## 2012-01-24 DIAGNOSIS — M4802 Spinal stenosis, cervical region: Secondary | ICD-10-CM | POA: Insufficient documentation

## 2012-01-25 ENCOUNTER — Other Ambulatory Visit (HOSPITAL_COMMUNITY): Payer: Medicare Other

## 2012-03-07 ENCOUNTER — Emergency Department (INDEPENDENT_AMBULATORY_CARE_PROVIDER_SITE_OTHER)
Admission: EM | Admit: 2012-03-07 | Discharge: 2012-03-07 | Disposition: A | Discharge status: Home / Self Care | Payer: Medicare Other | Source: Home / Self Care | Attending: Emergency Medicine | Admitting: Emergency Medicine

## 2012-03-07 ENCOUNTER — Encounter (HOSPITAL_COMMUNITY): Payer: Self-pay | Admitting: *Deleted

## 2012-03-07 DIAGNOSIS — L0390 Cellulitis, unspecified: Secondary | ICD-10-CM

## 2012-03-07 DIAGNOSIS — L0291 Cutaneous abscess, unspecified: Secondary | ICD-10-CM

## 2012-03-07 MED ORDER — HYDROCODONE-ACETAMINOPHEN 5-325 MG PO TABS: ORAL_TABLET | ORAL | Status: DC

## 2012-03-07 NOTE — ED Provider Notes (Signed)
Chief Complaint  Patient presents with  . Abscess    History of Present Illness:    [name] is a 62 year old male with chronic kidney disease and is on dialysis who has had a one-week history of a painful boil in his left temporal area, just behind his ear. This has not drained any pus and he's not had fever or chills. A PA at dialysis gave him a prescription for Augmentin 5 days ago is been taking this without much relief. He had a boil on his buttocks last year but no other history of skin infections, pulse, abscesses, or MRSA. He has no history of diabetes. He does have heart disease and chronic kidney disease.  Review of Systems:  Other than noted above, the patient denies any of the following symptoms: Systemic:  No fever, chills or sweats. Skin:  No rash or itching.  PMFSH:  Past medical history, family history, social history, meds, and allergies were reviewed.  No history of diabetes or prior history of abscesses or MRSA.  Physical Exam:   Vital signs:  BP 103/68  Pulse 92  Temp(Src) 98 F (36.7 C) (Oral)  SpO2 97% Skin:  There is a 1.5 cm raised, fluctuant area on the left temporal area, just behind the ear. This was not draining any pus.  Skin exam was otherwise normal.  No rash. Ext:  Distal pulses were full, patient has full ROM of all joints.    Procedure:  Verbal informed consent was obtained.  The patient was informed of the risks and benefits of the procedure and understands and accepts.  Identity of the patient was verified verbally and by wristband.   The abscess area described above was prepped with Betadine and alcohol and anesthetized with 3 mL of 2% Xylocaine with epinephrine.  Using a #11 scalpel blade, a singe straight incision was made into the area of fluctulence, yielding a large amount of prurulent drainage.  Routine cultures were obtained.  Blunt dissection was used to break up loculations and the resulting wound cavity was packed with 1/4 inch  Iodoform gauze.  A sterile pressure dressing was applied.  Assessment:  The encounter diagnosis was Abscess.  Plan:   1.  The following meds were prescribed:   Discharge Medication List as of 03/07/2012  1:51 PM    START taking these medications   Details  HYDROcodone-acetaminophen (NORCO/VICODIN) 5-325 MG per tablet 1 to 2 tabs every 4 to 6 hours as needed for pain., Print       He was instructed to continue on with the Augmentin.  2.  The patient was instructed in symptomatic care and handouts were given. 3.  The patient was instructed to leave the dressing in place and return again in 48 hours for packing removal.   Reuben Likes, MD 03/07/12 1421

## 2012-03-07 NOTE — ED Notes (Signed)
Pt reports boil on back of head, behind left ear for the past week - states that he was given antibiotics by dialysis PA one week ago but it hasn't gotten any better - denies any other symptoms. Last dialysis treatment on Monday.

## 2012-03-10 ENCOUNTER — Emergency Department (INDEPENDENT_AMBULATORY_CARE_PROVIDER_SITE_OTHER)
Admission: EM | Admit: 2012-03-10 | Discharge: 2012-03-10 | Disposition: A | Discharge status: Home / Self Care | Payer: Self-pay | Source: Home / Self Care | Attending: Family Medicine | Admitting: Family Medicine

## 2012-03-10 ENCOUNTER — Encounter (HOSPITAL_COMMUNITY): Payer: Self-pay | Admitting: Emergency Medicine

## 2012-03-10 DIAGNOSIS — Z4800 Encounter for change or removal of nonsurgical wound dressing: Secondary | ICD-10-CM

## 2012-03-10 DIAGNOSIS — IMO0001 Reserved for inherently not codable concepts without codable children: Secondary | ICD-10-CM

## 2012-03-10 LAB — CULTURE, ROUTINE-ABSCESS: Culture: NO GROWTH

## 2012-03-10 NOTE — ED Provider Notes (Signed)
History     CSN: [id number]  Arrival date & time 03/10/12  1348   First MD Initiated Contact with Patient 03/10/12 1359      Chief Complaint  Patient presents with  . Follow-up    (Consider location/radiation/quality/duration/timing/severity/associated sxs/prior treatment) Patient is a 62 y.o. male presenting with wound check. The history is provided by the patient.  Wound Check This is a new problem. The current episode started more than 2 days ago. The problem has been gradually improving.    Past Medical History  Diagnosis Date  . Chronic kidney disease     renal failure  . Hemodialysis patient 1996    [facility] [place], M-W-F-  . Anemia   . GERD (gastroesophageal reflux disease)   . Nonischemic cardiomyopathy     dx 2005, now EF  >55% (echo 04/2009)  . Perirectal abscess   . Thyroid disease     hyperparathyroidism  . Chronic obstructive pulmonary disease     Past Surgical History  Procedure Laterality Date  . Insertion of dialysis catheter    . Av fistula placement      Numerous fistulas placed in both arms and left leg; using rt arm fistula presently  . Examination under anesthesia  03/18/2011    Procedure: EXAM UNDER ANESTHESIA;  Surgeon: Robyne Askew, MD;  Location: [facility] OR;  Service: General;  Laterality: N/A;  exam under anesthesia, incision and drainage of perirectal abscess  . Parathyroidectomy      No family history on file.  History  Substance Use Topics  . Smoking status: Former Smoker -- 2.00 packs/day for 15 years    Types: Cigarettes    Quit date: 03/11/1983  . Smokeless tobacco: Never Used  . Alcohol Use: No      Review of Systems  Constitutional: Negative.   Skin: Positive for rash.    Allergies  Review of patient's allergies indicates no known allergies.  Home Medications   Current Outpatient Rx  Name  Route  Sig  Dispense  Refill  . amoxicillin-clavulanate (AUGMENTIN) 500-125 MG per tablet   Oral   Take 1 tablet by mouth 3  (three) times daily.         Marland Kitchen HYDROcodone-acetaminophen (NORCO/VICODIN) 5-325 MG per tablet      1 to 2 tabs every 4 to 6 hours as needed for pain.   20 tablet   0   . aspirin EC 81 MG tablet   Oral   Take 81 mg by mouth daily.         Marland Kitchen lanthanum (FOSRENOL) 500 MG chewable tablet   Oral   Chew 1,000 mg by mouth 3 (three) times daily with meals.         Marland Kitchen oxyCODONE-acetaminophen (PERCOCET/ROXICET) 5-325 MG per tablet   Oral   Take 1-2 tablets by mouth every 6 (six) hours as needed for pain.   10 tablet   0     There were no vitals taken for this visit.  Physical Exam  Nursing note and vitals reviewed. Constitutional: He is oriented to person, place, and time. He appears well-developed and well-nourished.  Neurological: He is alert and oriented to person, place, and time.  Skin: Skin is warm and dry.  I+D site healing well, no drainage, nontender/     ED Course  Procedures (including critical care time)  Labs Reviewed - No data to display No results found.   1. Wound check, dressing change  MDM  Packing removed.        Linna Hoff, MD 03/10/12 [phone]

## 2012-03-10 NOTE — ED Notes (Signed)
Pt is here for a f/u and to have packing removed.  Seen on 03/07/12; taking and tolerating well augmentin Denies: any medical problems.   He is alert w/no signs of acute distress.

## 2012-05-11 ENCOUNTER — Ambulatory Visit: Payer: Self-pay | Admitting: Vascular Surgery

## 2013-05-17 ENCOUNTER — Ambulatory Visit: Payer: Self-pay | Admitting: Vascular Surgery

## 2013-05-17 LAB — POTASSIUM: POTASSIUM: 4.7 mmol/L (ref 3.5–5.1)

## 2013-10-11 ENCOUNTER — Other Ambulatory Visit (HOSPITAL_COMMUNITY): Payer: Self-pay | Admitting: Otolaryngology

## 2013-10-11 ENCOUNTER — Ambulatory Visit (HOSPITAL_COMMUNITY)
Admission: RE | Admit: 2013-10-11 | Discharge: 2013-10-11 | Disposition: A | Discharge status: Home / Self Care | Payer: Medicare Other | Source: Ambulatory Visit | Attending: Otolaryngology | Admitting: Otolaryngology

## 2013-10-11 DIAGNOSIS — R05 Cough: Secondary | ICD-10-CM | POA: Diagnosis present

## 2013-10-11 DIAGNOSIS — J811 Chronic pulmonary edema: Secondary | ICD-10-CM | POA: Diagnosis not present

## 2013-10-11 DIAGNOSIS — J90 Pleural effusion, not elsewhere classified: Secondary | ICD-10-CM | POA: Insufficient documentation

## 2013-10-11 DIAGNOSIS — R053 Chronic cough: Secondary | ICD-10-CM

## 2013-10-11 DIAGNOSIS — R059 Cough, unspecified: Secondary | ICD-10-CM | POA: Diagnosis present

## 2013-10-12 ENCOUNTER — Encounter (INDEPENDENT_AMBULATORY_CARE_PROVIDER_SITE_OTHER): Payer: Self-pay

## 2013-10-12 ENCOUNTER — Encounter: Payer: Self-pay | Admitting: Emergency Medicine

## 2013-10-12 ENCOUNTER — Ambulatory Visit (INDEPENDENT_AMBULATORY_CARE_PROVIDER_SITE_OTHER): Payer: Medicare Other | Admitting: Emergency Medicine

## 2013-10-12 VITALS — HR 58 | Ht 69.0 in | Wt 254.0 lb

## 2013-10-12 DIAGNOSIS — R05 Cough: Secondary | ICD-10-CM

## 2013-10-12 DIAGNOSIS — R059 Cough, unspecified: Secondary | ICD-10-CM

## 2013-10-12 DIAGNOSIS — R053 Chronic cough: Secondary | ICD-10-CM

## 2013-10-12 NOTE — Assessment & Plan Note (Signed)
His cough has been nonproductive and he has not had other symptoms associated with acute bronchitis. He tells me that he feels better since starting Augmentin however. He has some chronic low-level rhinitis. He denies GERD.  - Will complete his Augmentin to see if his cough resolves - If the cough continues in followup and we will plan to treat his rhinitis and probably empirically treat GERD

## 2013-10-12 NOTE — Patient Instructions (Signed)
Please continue your Augmentin as prescribed by Dr Ernesto Rutherford until it is all gone Follow with Dr Lamonte Sakai in 1 month if you are still coughing. Otherwise you may cancel our appointment

## 2013-10-12 NOTE — Progress Notes (Signed)
Subjective:    Patient ID: [name], male    DOB: 04-Sep-1950, 63 y.o.   MRN: [id number]  HPI 63 yo man, hx tobacco (30 pk-yrs), ESRD on HD, non-ischemic CM, reported COPD. He is referred by Dr Ernesto Rutherford today for his cough. This started about 2 months ago. It bothers him at night, wakes him from sleep. Non-productive. Can happen during the day as well, often in paroxysms. He has some clear nasal gtt. Denies any GERD sx. His lary yesterday by Dr Ernesto Rutherford was normal (per the pt's report).  Occasional SOB with exertion and walking. Occasional wheeze. He was given augmentin to take for 10 days by Dr Ernesto Rutherford > starting to improve.    Review of Systems  Constitutional: Negative for fever and unexpected weight change.  HENT: Negative for congestion, dental problem, ear pain, nosebleeds, postnasal drip, rhinorrhea, sinus pressure, sneezing, sore throat and trouble swallowing.   Eyes: Negative for redness and itching.  Respiratory: Positive for cough and shortness of breath. Negative for chest tightness and wheezing.   Cardiovascular: Positive for leg swelling. Negative for palpitations.  Gastrointestinal: Negative for nausea and vomiting.  Genitourinary: Negative for dysuria.  Musculoskeletal: Negative for joint swelling.  Skin: Negative for rash.  Neurological: Negative for headaches.  Hematological: Does not bruise/bleed easily.  Psychiatric/Behavioral: Negative for dysphoric mood. The patient is not nervous/anxious.    Past Medical History  Diagnosis Date  . Chronic kidney disease     renal failure  . Hemodialysis patient [place], M-W-F-  . Anemia   . GERD (gastroesophageal reflux disease)   . Nonischemic cardiomyopathy     dx 2005, now EF  >55% (echo 04/2009)  . Perirectal abscess   . Thyroid disease     hyperparathyroidism  . Chronic obstructive pulmonary disease      No family history on file.   History   Social History  . Marital Status: Legally  Separated    Spouse Name: N/A    Number of Children: N/A  . Years of Education: N/A   Occupational History  . retired    Social History Main Topics  . Smoking status: Former Smoker -- 2.00 packs/day for 15 years    Types: Cigarettes    Quit date: 03/11/1983  . Smokeless tobacco: Never Used  . Alcohol Use: No  . Drug Use: No  . Sexual Activity: Not on file   Other Topics Concern  . Not on file   Social History Narrative  . No narrative on file     No Known Allergies   Outpatient Prescriptions Prior to Visit  Medication Sig Dispense Refill  . amoxicillin-clavulanate (AUGMENTIN) 500-125 MG per tablet Take 1 tablet by mouth 3 (three) times daily.      Marland Kitchen aspirin EC 81 MG tablet Take 81 mg by mouth daily.      Marland Kitchen HYDROcodone-acetaminophen (NORCO/VICODIN) 5-325 MG per tablet 1 to 2 tabs every 4 to 6 hours as needed for pain.  20 tablet  0  . lanthanum (FOSRENOL) 500 MG chewable tablet Chew 1,000 mg by mouth 3 (three) times daily with meals.      Marland Kitchen oxyCODONE-acetaminophen (PERCOCET/ROXICET) 5-325 MG per tablet Take 1-2 tablets by mouth every 6 (six) hours as needed for pain.  10 tablet  0   No facility-administered medications prior to visit.        Objective:   Physical Exam Filed Vitals:   10/12/13 1533  Pulse: 58  Height: 5\' 9"  (1.753 m)  Weight: 254 lb (115.214 kg)  SpO2: 92%   Gen: Pleasant, elderly man, in no distress,  normal affect  ENT: No lesions,  mouth clear,  oropharynx clear, no postnasal drip  Neck: No JVD, no TMG, no carotid bruits  Lungs: No use of accessory muscles, B basilar insp crackles  Cardiovascular: RRR, heart sounds normal, no murmur or gallops, no peripheral edema  Musculoskeletal: No deformities, no cyanosis or clubbing  Neuro: alert, non focal  Skin: Warm, no lesions or rashes    CXR 10/11/13 --  COMPARISON: 03/11/2011  FINDINGS:  The cardiac silhouette is mildly enlarged. There is new pulmonary  vascular congestion with mild  indistinctness of the pulmonary  vasculature and mild bilateral interstitial opacities, greatest in  the lung bases. There are new, small bilateral pleural effusions.  Small amount of fluid is noted along the seizures. No pneumothorax  is seen. No acute osseous abnormality is identified. Vascular stent  is partially visualized in the region of the right axilla.  IMPRESSION:  Mild pulmonary edema and small bilateral pleural effusions     Assessment & Plan:  Chronic cough His cough has been nonproductive and he has not had other symptoms associated with acute bronchitis. He tells me that he feels better since starting Augmentin however. He has some chronic low-level rhinitis. He denies GERD.  - Will complete his Augmentin to see if his cough resolves - If the cough continues in followup and we will plan to treat his rhinitis and probably empirically treat GERD

## 2013-10-19 ENCOUNTER — Ambulatory Visit: Payer: Self-pay | Admitting: Vascular Surgery

## 2013-10-19 LAB — POTASSIUM: POTASSIUM: 3.9 mmol/L (ref 3.5–5.1)

## 2013-10-22 ENCOUNTER — Institutional Professional Consult (permissible substitution): Payer: Medicare Other | Admitting: Internal Medicine

## 2013-11-01 ENCOUNTER — Ambulatory Visit: Payer: Self-pay | Admitting: Vascular Surgery

## 2013-11-01 ENCOUNTER — Encounter: Payer: Self-pay | Admitting: Podiatry

## 2013-11-01 ENCOUNTER — Ambulatory Visit (INDEPENDENT_AMBULATORY_CARE_PROVIDER_SITE_OTHER): Payer: Medicare Other | Admitting: Podiatry

## 2013-11-01 DIAGNOSIS — B351 Tinea unguium: Secondary | ICD-10-CM

## 2013-11-01 DIAGNOSIS — M79673 Pain in unspecified foot: Secondary | ICD-10-CM

## 2013-11-01 NOTE — Progress Notes (Signed)
Subjective:     Patient ID: [name], male   DOB: 03/24/1950, 63 y.o.   MRN: [id number]  HPI patient presents with thick nailbeds 1-5 both feet that are painful and are impossible for him to cut   Review of Systems     Objective:   Physical Exam Neurovascular status intact with thick yellow brittle nailbeds 1-5 both feet that are painful    Assessment:     Mycotic nail infection with pain 1-5 both feet    Plan:     Debris painful nailbeds 1-5 both feet with no iatrogenic bleeding noted

## 2013-11-20 ENCOUNTER — Ambulatory Visit: Payer: Medicare Other | Admitting: Emergency Medicine

## 2013-12-04 ENCOUNTER — Encounter: Payer: Self-pay | Admitting: Physician Assistant

## 2013-12-04 ENCOUNTER — Other Ambulatory Visit (INDEPENDENT_AMBULATORY_CARE_PROVIDER_SITE_OTHER): Payer: Medicare Other

## 2013-12-04 ENCOUNTER — Ambulatory Visit (INDEPENDENT_AMBULATORY_CARE_PROVIDER_SITE_OTHER): Payer: Medicare Other | Admitting: Physician Assistant

## 2013-12-04 VITALS — HR 68 | Ht 69.0 in | Wt 243.8 lb

## 2013-12-04 DIAGNOSIS — Z992 Dependence on renal dialysis: Secondary | ICD-10-CM

## 2013-12-04 DIAGNOSIS — Z1211 Encounter for screening for malignant neoplasm of colon: Secondary | ICD-10-CM

## 2013-12-04 DIAGNOSIS — R630 Anorexia: Secondary | ICD-10-CM

## 2013-12-04 DIAGNOSIS — R634 Abnormal weight loss: Secondary | ICD-10-CM

## 2013-12-04 DIAGNOSIS — N186 End stage renal disease: Secondary | ICD-10-CM | POA: Insufficient documentation

## 2013-12-04 LAB — TSH: TSH: 2.41 u[IU]/mL (ref 0.35–4.50)

## 2013-12-04 MED ORDER — PANTOPRAZOLE SODIUM 40 MG PO TBEC: DELAYED_RELEASE_TABLET | ORAL | Status: DC

## 2013-12-04 MED ORDER — MOVIPREP 100 G PO SOLR: 1.0000 | ORAL | Status: DC

## 2013-12-04 NOTE — Patient Instructions (Addendum)
Please go to the basement level to have your labs drawn.  We sent a prescription for the colonoscopy prep and the protonix 40 mg tablets to [place]/[address]. You have been scheduled for an endoscopy and colonoscopy. Please follow the written instructions given to you at your visit today. Please pick up your prep at the pharmacy within the next 1-3 days. If you use inhalers (even only as needed), please bring them with you on the day of your procedure.   You have been scheduled for a CT scan of the abdomen and pelvis at [facility] ([address]---this is in the same building as [facility]).   You are scheduled on Thursday 12-06-2013 at 10:00 am . You should arrive 15 minutes prior to your appointment time for registration. Please follow the written instructions below on the day of your exam:  WARNING: IF YOU ARE ALLERGIC TO IODINE/X-RAY DYE, PLEASE NOTIFY RADIOLOGY IMMEDIATELY AT [id number]! YOU WILL BE GIVEN A 13 HOUR PREMEDICATION PREP.  1) Do not eat or drink anything after 6:00 am  (4 hours prior to your test) 2) You have been given 2 bottles of oral contrast to drink. The solution may taste  better if refrigerated, but do NOT add ice or any other liquid to this solution. Shake well before drinking.    Drink 1 bottle of contrast @ 11:00 am  (2 hours prior to your exam)  Drink 1 bottle of contrast @ 1:00 PM  (1 hour prior to your exam)  You may take any medications as prescribed with a small amount of water except for the following: Metformin, Glucophage, Glucovance, Avandamet, Riomet, Fortamet, Actoplus Met, Janumet, Glumetza or Metaglip. The above medications must be held the day of the exam AND 48 hours after the exam.  The purpose of you drinking the oral contrast is to aid in the visualization of your intestinal tract. The contrast solution may cause some diarrhea. Before your exam is started, you will be given a small amount of fluid to drink.  Depending on your individual set of symptoms, you may also receive an intravenous injection of x-ray contrast/dye. Plan on being at [facility] for 30 minutes or long, depending on the type of exam you are having performed.  If you have any questions regarding your exam or if you need to reschedule, you may call the CT department at [phone] between the hours of 8:00 am and 5:00 pm, Monday-Friday.  ________________________________________________________________________

## 2013-12-04 NOTE — Progress Notes (Signed)
Subjective:    Patient ID: [name], male    DOB: 27-Nov-1950, 63 y.o.   MRN: [id number]  HPI  [name] is a 63 year old African-American male new to GI today referred by Dr. Erling Cruz. Patient has history of end-stage renal disease has been on dialysis for almost 20 years, also with history of nonischemic cardiomyopathy, and probable COPD. He has had problems with perirectal abscess 2. Patient has not had any prior colonoscopy. He is referred today for weight loss of approximately 30 pounds over the past year or so. Patient is not a great historian but says the weight has gradually, off more so over the past 6 months. His appetite has been decreased over the past month per his wife no nausea or vomiting. No complaints of abdominal pain or change in bowel habits no melena or hematochezia. On further questioning he desire denies heartburn or indigestion has had some problems with hoarseness and intermittent discomfort in his esophagus no real dysphagia. No prior abdominal surgeries Patient is not on any regular aspirin or NSAIDs and no PPI therapy. He has not had any recent imaging. Most recent labs from 11/21/2013 hemoglobin 10.2 hematocrit 32 serum iron 37 units 137 hepatitis serologies negative albumin 3.5B UN 37 creatinine 5.4    Review of Systems  Constitutional: Positive for appetite change, fatigue and unexpected weight change.  HENT: Positive for trouble swallowing and voice change.   Eyes: Negative.   Respiratory: Positive for cough.   Cardiovascular: Negative.   Gastrointestinal: Negative.   Endocrine: Negative.   Genitourinary: Negative.   Musculoskeletal: Positive for gait problem.  Allergic/Immunologic: Negative.   Neurological: Negative.   Hematological: Negative.   Psychiatric/Behavioral: Negative.    Outpatient Prescriptions Prior to Visit  Medication Sig Dispense Refill  . amoxicillin-clavulanate (AUGMENTIN) 500-125 MG per tablet Take 1 tablet by mouth 3 (three)  times daily.    Marland Kitchen aspirin EC 81 MG tablet Take 81 mg by mouth daily.    Marland Kitchen HYDROcodone-acetaminophen (NORCO/VICODIN) 5-325 MG per tablet 1 to 2 tabs every 4 to 6 hours as needed for pain. 20 tablet 0  . lanthanum (FOSRENOL) 500 MG chewable tablet Chew 1,000 mg by mouth 3 (three) times daily with meals.    . midodrine (PROAMATINE) 10 MG tablet Take 1 tablet by mouth. On dialysis days- Mon, Wed, Fri    . oxyCODONE-acetaminophen (PERCOCET/ROXICET) 5-325 MG per tablet Take 1-2 tablets by mouth every 6 (six) hours as needed for pain. 10 tablet 0   No facility-administered medications prior to visit.   No Known Allergies Patient Active Problem List   Diagnosis Date Noted  . ESRD on dialysis 12/04/2013  . Chronic cough 10/12/2013  . Perirectal abscess 02/18/2011   History  Substance Use Topics  . Smoking status: Former Smoker -- 2.00 packs/day for 15 years    Types: Cigarettes    Quit date: 03/11/1983  . Smokeless tobacco: Never Used  . Alcohol Use: No   family history is not on file.     Objective:   Physical Exam  Well-developed chronically ill her African-American male in no acute distress he ambulates with walker accompanied by his wife blood pressure not able to be obtained due to large grafts pulse 68 height 5 foot 9 weight 243. HEENT; nontraumatic normocephalic EOMI PERRLA sclera anicteric, Supple; no JVD, Cardiovascular; regular rate and rhythm with T6-A2 soft systolic murmur, Pulmonary ;clear bilaterally, Abdomen; obese soft nontender nondistended bowel sounds are active she'll palpable mass or hepatosplenomegaly rectal  exam not done, Extremities; edema in his hands and 2+ edema in the ankles and feet, Psych ;mood and affect appropriate mentation a bit slow        Assessment & Plan:  #63  33 -year-old African-American male with end-stage renal disease on dialysis 20 years with complaints of weight loss and anorexia over the past several months. Etiology of symptoms is not clear,  rule out occult malignancy,peptic ulcer disease, gastroparesis #2 cough and hoarseness-Question GERD #3nonischemic cardiomyopathy #4 history of perirectal abscess  Plan;start Protonix 40 mg by mouth every morning Check TSH Schedule for CT scan of the abdomen and pelvis no IV contrast Schedule for EGD and colonoscopy with Dr. Ardis Hughs, on a nondialysis day. Procedures discussed in detail with the patient and his wife and they are agreeable to proceed

## 2013-12-04 NOTE — Progress Notes (Signed)
D/w [name], I agree with the above note, plan

## 2013-12-06 ENCOUNTER — Ambulatory Visit (INDEPENDENT_AMBULATORY_CARE_PROVIDER_SITE_OTHER)
Admission: RE | Admit: 2013-12-06 | Discharge: 2013-12-06 | Disposition: A | Discharge status: Home / Self Care | Payer: Medicare Other | Source: Ambulatory Visit | Attending: Physician Assistant | Admitting: Physician Assistant

## 2013-12-06 DIAGNOSIS — R630 Anorexia: Secondary | ICD-10-CM

## 2013-12-06 DIAGNOSIS — R634 Abnormal weight loss: Secondary | ICD-10-CM

## 2013-12-07 ENCOUNTER — Encounter (HOSPITAL_COMMUNITY): Payer: Self-pay | Admitting: *Deleted

## 2013-12-11 ENCOUNTER — Telehealth: Payer: Self-pay | Admitting: Physician Assistant

## 2013-12-11 NOTE — Telephone Encounter (Signed)
Confirmed the hospital endoscopies have been cancelled

## 2013-12-17 ENCOUNTER — Other Ambulatory Visit (INDEPENDENT_AMBULATORY_CARE_PROVIDER_SITE_OTHER): Payer: Self-pay | Admitting: General Surgery

## 2013-12-17 DIAGNOSIS — C480 Malignant neoplasm of retroperitoneum: Secondary | ICD-10-CM | POA: Insufficient documentation

## 2013-12-21 ENCOUNTER — Other Ambulatory Visit: Payer: Self-pay | Admitting: Radiology

## 2013-12-24 ENCOUNTER — Other Ambulatory Visit: Payer: Self-pay | Admitting: Radiology

## 2013-12-25 ENCOUNTER — Ambulatory Visit (HOSPITAL_COMMUNITY)
Admission: RE | Admit: 2013-12-25 | Discharge: 2013-12-25 | Disposition: A | Discharge status: Home / Self Care | Payer: Medicare Other | Source: Ambulatory Visit | Attending: General Surgery | Admitting: General Surgery

## 2013-12-25 ENCOUNTER — Encounter (HOSPITAL_COMMUNITY): Payer: Self-pay

## 2013-12-25 DIAGNOSIS — Z79899 Other long term (current) drug therapy: Secondary | ICD-10-CM | POA: Insufficient documentation

## 2013-12-25 DIAGNOSIS — Z87891 Personal history of nicotine dependence: Secondary | ICD-10-CM | POA: Insufficient documentation

## 2013-12-25 DIAGNOSIS — D1779 Benign lipomatous neoplasm of other sites: Secondary | ICD-10-CM | POA: Insufficient documentation

## 2013-12-25 DIAGNOSIS — Z992 Dependence on renal dialysis: Secondary | ICD-10-CM | POA: Diagnosis not present

## 2013-12-25 DIAGNOSIS — N186 End stage renal disease: Secondary | ICD-10-CM | POA: Diagnosis not present

## 2013-12-25 DIAGNOSIS — R634 Abnormal weight loss: Secondary | ICD-10-CM | POA: Diagnosis not present

## 2013-12-25 DIAGNOSIS — R1900 Intra-abdominal and pelvic swelling, mass and lump, unspecified site: Secondary | ICD-10-CM | POA: Diagnosis not present

## 2013-12-25 DIAGNOSIS — C480 Malignant neoplasm of retroperitoneum: Secondary | ICD-10-CM

## 2013-12-25 LAB — BASIC METABOLIC PANEL
Anion gap: 16 — ABNORMAL HIGH (ref 5–15)
BUN: 29 mg/dL — ABNORMAL HIGH (ref 6–23)
CALCIUM: 9.1 mg/dL (ref 8.4–10.5)
CO2: 32 meq/L (ref 19–32)
CREATININE: 5.82 mg/dL — AB (ref 0.50–1.35)
Chloride: 93 mEq/L — ABNORMAL LOW (ref 96–112)
GFR calc non Af Amer: 9 mL/min — ABNORMAL LOW (ref >90)
GFR, EST AFRICAN AMERICAN: 11 mL/min — AB (ref >90)
Glucose, Bld: 80 mg/dL (ref 70–99)
Potassium: 5.7 mEq/L — ABNORMAL HIGH (ref 3.7–5.3)
Sodium: 141 mEq/L (ref 137–147)

## 2013-12-25 LAB — CBC WITH DIFFERENTIAL/PLATELET
BASOS PCT: 1 % (ref 0–1)
Basophils Absolute: 0.1 10*3/uL (ref 0.0–0.1)
EOS ABS: 0.1 10*3/uL (ref 0.0–0.7)
Eosinophils Relative: 2 % (ref 0–5)
HEMATOCRIT: 39 % (ref 39.0–52.0)
Hemoglobin: 12.3 g/dL — ABNORMAL LOW (ref 13.0–17.0)
Lymphocytes Relative: 15 % (ref 12–46)
Lymphs Abs: 0.7 10*3/uL (ref 0.7–4.0)
MCH: 28.5 pg (ref 26.0–34.0)
MCHC: 31.5 g/dL (ref 30.0–36.0)
MCV: 90.5 fL (ref 78.0–100.0)
MONO ABS: 0.5 10*3/uL (ref 0.1–1.0)
Monocytes Relative: 12 % (ref 3–12)
Neutro Abs: 3.1 10*3/uL (ref 1.7–7.7)
Neutrophils Relative %: 70 % (ref 43–77)
Platelets: 152 10*3/uL (ref 150–400)
RBC: 4.31 MIL/uL (ref 4.22–5.81)
RDW: 16.6 % — ABNORMAL HIGH (ref 11.5–15.5)
WBC: 4.5 10*3/uL (ref 4.0–10.5)

## 2013-12-25 LAB — APTT: aPTT: 33 seconds (ref 24–37)

## 2013-12-25 LAB — PROTIME-INR
INR: 1.11 (ref 0.00–1.49)
Prothrombin Time: 14.5 seconds (ref 11.6–15.2)

## 2013-12-25 MED ORDER — HYDROCODONE-ACETAMINOPHEN 5-325 MG PO TABS: 1.0000 | ORAL_TABLET | ORAL | Status: DC | PRN

## 2013-12-25 MED ORDER — FENTANYL CITRATE 0.05 MG/ML IJ SOLN
INTRAMUSCULAR | Status: AC
  Filled 2013-12-25: qty 2

## 2013-12-25 MED ORDER — SODIUM CHLORIDE 0.9 % IV SOLN: INTRAVENOUS | Status: DC

## 2013-12-25 MED ORDER — LIDOCAINE HCL 1 % IJ SOLN
INTRAMUSCULAR | Status: AC
  Filled 2013-12-25: qty 20

## 2013-12-25 MED ORDER — MIDAZOLAM HCL 2 MG/2ML IJ SOLN
INTRAMUSCULAR | Status: AC
  Filled 2013-12-25: qty 2

## 2013-12-25 MED ORDER — FENTANYL CITRATE 0.05 MG/ML IJ SOLN
INTRAMUSCULAR | Status: AC | PRN
  Administered 2013-12-25 (×2): 12.5 ug via INTRAVENOUS

## 2013-12-25 MED ORDER — LIDOCAINE HCL (PF) 1 % IJ SOLN
INTRAMUSCULAR | Status: AC
  Filled 2013-12-25: qty 30

## 2013-12-25 MED ORDER — MIDAZOLAM HCL 2 MG/2ML IJ SOLN
INTRAMUSCULAR | Status: AC | PRN
  Administered 2013-12-25 (×2): 0.5 mg via INTRAVENOUS

## 2013-12-25 MED ORDER — SODIUM CHLORIDE 0.9 % IV SOLN
INTRAVENOUS | Status: AC | PRN
  Administered 2013-12-25: 10 mL/h via INTRAVENOUS

## 2013-12-25 NOTE — H&P (Signed)
Chief Complaint: "I am here for biopsy."  Referring Physician(s): Byerly,Faera  History of Present Illness: [name] is a 63 y.o. male with ESRD on HD M/W/F, last HD yesterday. The patient was referred to GI after c/o weight loss. GI ordered CT which revealed right retroperitoneal mass. The patient has been scheduled today for biopsy of the mass. He denies any chest pain, shortness of breath or palpitations. He denies any active signs of bleeding or excessive bruising. He denies any recent fever or chills. The patient denies any history of sleep apnea or chronic oxygen use. He has previously tolerated anesthesia without complications.   Past Medical History  Diagnosis Date  . Chronic kidney disease     renal failure  . Hemodialysis patient [place], M-W-F-  . Anemia   . GERD (gastroesophageal reflux disease)   . Nonischemic cardiomyopathy     dx 2005, now EF  >55% (echo 04/2009)  . Perirectal abscess   . Thyroid disease     hyperparathyroidism  . Chronic obstructive pulmonary disease     Past Surgical History  Procedure Laterality Date  . Insertion of dialysis catheter    . Av fistula placement      Numerous fistulas placed in both arms and left leg; using rt arm fistula presently  . Examination under anesthesia  03/18/2011    Procedure: EXAM UNDER ANESTHESIA;  Surgeon: [name], MD;  Location: [facility];  Service: General;  Laterality: N/A;  exam under anesthesia, incision and drainage of perirectal abscess  . Parathyroidectomy      Allergies: Review of patient's allergies indicates no known allergies.  Medications: Prior to Admission medications   Medication Sig Start Date End Date Taking? Authorizing Provider  lanthanum (FOSRENOL) 500 MG chewable tablet Chew 1,000 mg by mouth 3 (three) times daily with meals.   Yes Historical Provider, MD  midodrine (PROAMATINE) 10 MG tablet Take 10 mg by mouth 3 (three) times daily.    Yes Historical  Provider, MD  oxyCODONE-acetaminophen (PERCOCET/ROXICET) 5-325 MG per tablet Take 1-2 tablets by mouth every 6 (six) hours as needed for pain. Patient not taking: Reported on 12/25/2013 12/20/11   Jasper Riling. Pickering, MD  pantoprazole (PROTONIX) 40 MG tablet Take 1 tab before breakfast daily. Patient not taking: Reported on 12/25/2013 12/04/13   Amy S Esterwood, PA-C    History reviewed. No pertinent family history.  History   Social History  . Marital Status: Married    Spouse Name: N/A    Number of Children: N/A  . Years of Education: N/A   Occupational History  . retired    Social History Main Topics  . Smoking status: Former Smoker -- 2.00 packs/day for 15 years    Types: Cigarettes    Quit date: 03/11/1983  . Smokeless tobacco: Never Used  . Alcohol Use: No  . Drug Use: No  . Sexual Activity: None   Other Topics Concern  . None   Social History Narrative   Review of Systems: A 12 point ROS discussed and pertinent positives are indicated in the HPI above.  All other systems are negative.  Review of Systems  Vital Signs: BP 91/60 mmHg  Pulse 90  Temp(Src) 97.7 F (36.5 C) (Oral)  Resp 16  Ht 5\' 9"  (1.753 m)  Wt 243 lb (110.224 kg)  BMI 35.87 kg/m2  SpO2 100%  Physical Exam  Constitutional: He is oriented to person, place, and time.  No distress.  HENT:  Head: Normocephalic and atraumatic.  Neck: No tracheal deviation present.  Cardiovascular: Normal rate and regular rhythm.  Exam reveals no friction rub.   Murmur heard. Pulmonary/Chest: Effort normal and breath sounds normal. No respiratory distress. He has no wheezes. He has no rales.  Abdominal: Soft. Bowel sounds are normal. He exhibits no distension. There is no tenderness.  Neurological: He is alert and oriented to person, place, and time.  Skin: Skin is warm and dry. He is not diaphoretic.  Psychiatric: He has a normal mood and affect. His behavior is normal. Thought content normal.    Imaging: Ct  Abdomen Pelvis Wo Contrast  12/06/2013   CLINICAL DATA:  Weight loss.  Lack of appetite.  Leg swelling.  EXAM: CT ABDOMEN AND PELVIS WITHOUT CONTRAST  TECHNIQUE: Multidetector CT imaging of the abdomen and pelvis was performed following the standard protocol without IV contrast.  COMPARISON:  None.  FINDINGS: Lower chest: Bilateral pleural effusions are identified right greater than left. There is mild interstitial edema noted  Hepatobiliary: No suspicious liver abnormalities identified. There are multiple stones noted within the gallbladder. These measure up to 1.5 cm, image 32/series 2. No gallbladder wall thickening. No biliary dilatation.  Pancreas: The pancreas is on unremarkable.  Spleen: Normal appearance of the spleen.  Adrenals/Urinary Tract: The adrenal glands are both normal. Bilateral end-stage cystic kidney is identified on the left. There is at least 1, indeterminate hyperdense lesion within the inferior pole of the left kidney measuring 1.7 cm.  The right kidney is markedly abnormal. There is a large retroperitoneal mass which measures 14.7 x 13.6 x 19.8 cm. This is predominantly fat attenuation with internal areas of soft tissue stranding. The mass appears to involve the right renal collecting system resulting and marked enlargement of the right kidney. The mass extends deep into the right lower quadrant retroperitoneum as well as along the undersurface of the right hepatic lobe and has mass effect upon the gallbladder. Similar to the left kidney there are multiple right renal cysts compatible with end-stage kidney disease on dialysis. The urinary bladder is unremarkable for degree of distention  Stomach/Bowel: The stomach appears normal. The small bowel loops have a normal course and caliber. No obstruction. Normal appearance of the colon.  Vascular/Lymphatic: There is calcified atherosclerotic disease involving the abdominal aorta. No aneurysm. No retroperitoneal adenopathy.  Reproductive:  Unremarkable appearance of the prostate gland and seminal vesicles.  Other: There is no significant free fluid or fluid collections identified.  Musculoskeletal: Review of the visualized osseous structures is significant for degenerative disc disease within the lumbar spine.  IMPRESSION: 1. No acute findings within the abdomen or pelvis. 2. Large predominantly fat attenuation mass is identified arising within the right retroperitoneum. 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 The mass appears to involve and markedly expand the right renal collecting system. Alternative diagnosis would be a massive angiomyolipoma (AML). However, this is considered less favored. 3. Hyperdense lesion within the left kidney may represent a complex cyst or solid renal neoplasm. This could be better assessed with pre and post-contrast CT of the kidneys. 4. Gallstones. 5. Atherosclerotic disease.   Electronically Signed   By: Kerby Moors M.D.   On: 12/06/2013 12:07    Labs:  CBC:  Recent Labs  12/25/13 1002  WBC 4.5  HGB 12.3*  HCT 39.0  PLT 152    COAGS:  Recent Labs  12/25/13 1002  INR 1.11  APTT 33  BMP:  Recent Labs  12/25/13 1002  NA 141  K 5.7*  CL 93*  CO2 32  GLUCOSE 80  BUN 29*  CALCIUM 9.1  CREATININE 5.82*  GFRNONAA 9*  GFRAA 11*   Assessment and Plan: Weight loss 40 lbs last 2 months CT 11/12 revealed right retroperitoneal mass Scheduled today for image guided biopsy with moderate sedation Patient has been NPO, no blood thinners, labs reviewed Risks and Benefits discussed with the patient. All of the patient's questions were answered, patient is agreeable to proceed. Consent signed and in chart. ESRD on HD M/W/F, RUE AVG    Signed: Hedy Jacob 12/25/2013, 11:51 AM

## 2013-12-25 NOTE — Sedation Documentation (Signed)
Patient denies pain and is resting comfortably.  

## 2013-12-25 NOTE — Discharge Instructions (Signed)
Needle Biopsy °Care After °These instructions give you information on caring for yourself after your procedure. Your doctor may also give you more specific instructions. Call your doctor if you have any problems or questions after your procedure. °HOME CARE °· Rest for 4 hours after your biopsy, except for getting up to go to the bathroom or as told. °· Keep the places where the needles were put in clean and dry. °¨ Do not put powder or lotion on the sites. °¨ Do not shower until 24 hours after the test. Remove all bandages (dressings) before showering. °¨ Remove all bandages at least once every day. Gently clean the sites with soap and water. Keep putting a new bandage on until the skin is closed. °Finding out the results of your test °Ask your doctor when your test results will be ready. Make sure you follow up and get the test results. °GET HELP RIGHT AWAY IF:  °· You have shortness of breath or trouble breathing. °· You have pain or cramping in your belly (abdomen). °· You feel sick to your stomach (nauseous) or throw up (vomit). °· Any of the places where the needles were put in: °¨ Are puffy (swollen) or red. °¨ Are sore or hot to the touch. °¨ Are draining yellowish-white fluid (pus). °¨ Are bleeding after 10 minutes of pressing down on the site. Have someone keep pressing on any place that is bleeding until you see a doctor. °· You have any unusual pain that will not stop. °· You have a fever. °If you go to the emergency room, tell the nurse that you had a biopsy. Take this paper with you to show the nurse. °MAKE SURE YOU:  °· Understand these instructions. °· Will watch your condition. °· Will get help right away if you are not doing well or get worse. °Document Released: 12/25/2007 Document Revised: 04/05/2011 Document Reviewed: 12/25/2007 °ExitCare® Patient Information ©2015 [employer]. This information is not intended to replace advice given to you by your health care provider. Make sure you discuss  any questions you have with your health care provider. ° °

## 2013-12-25 NOTE — Procedures (Signed)
CT guided biopsy of right retroperitoneal mass/renal mass.  3 cores obtained.  No immediate complication.

## 2013-12-27 ENCOUNTER — Encounter (HOSPITAL_COMMUNITY): Admission: RE | Payer: Self-pay | Source: Ambulatory Visit

## 2013-12-27 ENCOUNTER — Ambulatory Visit (HOSPITAL_COMMUNITY): Admission: RE | Admit: 2013-12-27 | Payer: Medicare Other | Source: Ambulatory Visit | Admitting: Gastroenterology

## 2013-12-27 SURGERY — COLONOSCOPY WITH PROPOFOL
Anesthesia: Monitor Anesthesia Care

## 2014-01-08 ENCOUNTER — Encounter: Payer: Self-pay | Admitting: *Deleted

## 2014-01-08 NOTE — Progress Notes (Signed)
Patient ID: [name], male   DOB: 1950/04/24, 63 y.o.   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  Chronically ill  On dialysis for over 29 years Some hypotension and takes midodrine for support   Perirectal Abscess earlier in year Rx by Dr Marlou Starks.  Has lost over 30 lbs over the past year.  PMH describes nonischemic DCM 2005 but no details  F/u echo 2011 EF >55%    Review of CT shows 14 x 20 cm right retroperitoneal mass deflecting the right renal collecting system   No CHF symptoms No chest pain or history of CAD.    Dr Barry Dienes plannidng to resect sarcoma and possibly right kidney     ROS: Denies fever, malais, weight loss, blurry vision, decreased visual acuity, cough, sputum, SOB, hemoptysis, pleuritic pain, palpitaitons, heartburn, abdominal pain, melena, lower extremity edema, claudication, or rash.  All other systems reviewed and negative   General: Affect appropriate Chronically ill black male  HEENT: normal Neck supple with no adenopathy JVP normal no bruits no thyromegaly Lungs clear with no wheezing and good diaphragmatic motion Heart:  S1/S2 no murmur,rub, gallop or click PMI normal Abdomen: benighn, BS positve, no tenderness, no AAA no bruit.  No HSM or HJR Distal pulses intact with no bruits Plus one bilateral edema Neuro non-focal  Slowness to speech and right sided tremor that is controllable  Skin warm and dry No muscular weakness Fistula RUE   Medications Current Outpatient Prescriptions  Medication Sig Dispense Refill  . lanthanum (FOSRENOL) 500 MG chewable tablet Chew 1,000 mg by mouth 3 (three) times daily with meals.    . midodrine (PROAMATINE) 10 MG tablet Take 10 mg by mouth 3 (three) times daily.     Marland Kitchen oxyCODONE-acetaminophen (PERCOCET/ROXICET) 5-325 MG per tablet Take 1-2 tablets by mouth every 6 (six) hours as needed for pain. (Patient not taking: Reported on  12/25/2013) 10 tablet 0  . pantoprazole (PROTONIX) 40 MG tablet Take 1 tab before breakfast daily. (Patient not taking: Reported on 12/25/2013) 30 tablet 11   No current facility-administered medications for this visit.    Allergies Review of patient's allergies indicates no known allergies.  Family History: No family history on file.  Social History: History   Social History  . Marital Status: Married    Spouse Name: N/A    Number of Children: N/A  . Years of Education: N/A   Occupational History  . retired    Social History Main Topics  . Smoking status: Former Smoker -- 2.00 packs/day for 15 years    Types: Cigarettes    Quit date: 03/11/1983  . Smokeless tobacco: Never Used  . Alcohol Use: No  . Drug Use: No  . Sexual Activity: Not on file   Other Topics Concern  . Not on file   Social History Narrative    Past Surgical History  Procedure Laterality Date  . Insertion of dialysis catheter    . Av fistula placement      Numerous fistulas placed in both arms and left leg; using rt arm fistula presently  . Examination under anesthesia  03/18/2011    Procedure: EXAM UNDER ANESTHESIA;  Surgeon: [name], MD;  Location: [facility];  Service: General;  Laterality: N/A;  exam under anesthesia, incision and drainage of perirectal abscess  . Parathyroidectomy      Past  Medical History  Diagnosis Date  . Chronic kidney disease     renal failure  . Hemodialysis patient [place], M-W-F-  . Anemia   . GERD (gastroesophageal reflux disease)   . Nonischemic cardiomyopathy     dx 2005, now EF  >55% (echo 04/2009)  . Perirectal abscess   . Thyroid disease     hyperparathyroidism  . Chronic obstructive pulmonary disease     Electrocardiogram: 03/23/11  SR rate 86 poor R wave progression LAFB  Today Sinus rate 92 LAD ICLBBB no significant change   Assessment and Plan

## 2014-01-09 ENCOUNTER — Encounter: Payer: Self-pay | Admitting: Cardiovascular Disease

## 2014-01-09 ENCOUNTER — Ambulatory Visit (INDEPENDENT_AMBULATORY_CARE_PROVIDER_SITE_OTHER): Payer: Medicare Other | Admitting: Cardiovascular Disease

## 2014-01-09 VITALS — BP 120/60 | HR 92 | Resp 12 | Ht 69.0 in | Wt 231.8 lb

## 2014-01-09 DIAGNOSIS — N186 End stage renal disease: Secondary | ICD-10-CM

## 2014-01-09 DIAGNOSIS — Z01818 Encounter for other preprocedural examination: Secondary | ICD-10-CM

## 2014-01-09 DIAGNOSIS — K611 Rectal abscess: Secondary | ICD-10-CM

## 2014-01-09 DIAGNOSIS — Z01811 Encounter for preprocedural respiratory examination: Secondary | ICD-10-CM

## 2014-01-09 DIAGNOSIS — C480 Malignant neoplasm of retroperitoneum: Secondary | ICD-10-CM

## 2014-01-09 DIAGNOSIS — Z992 Dependence on renal dialysis: Secondary | ICD-10-CM

## 2014-01-09 NOTE — Patient Instructions (Signed)

## 2014-01-09 NOTE — Assessment & Plan Note (Signed)
Good functioning fistula  Continue midodrine for BP support  F/u [place] [facility]

## 2014-01-09 NOTE — Assessment & Plan Note (Signed)
Vague history of DCM but last echo 2011 EF 55%  Will order repeat echo and if EF ok he is clear for surgery. He still is moderate risk based On comorbidities with age, poor functional status, weight loss ? From early satiety and autonomic dysfunction with over 20 years of dialysis

## 2014-01-09 NOTE — Assessment & Plan Note (Signed)
Off antibiotics resolved avoid prolonged sitting

## 2014-01-10 ENCOUNTER — Ambulatory Visit (HOSPITAL_COMMUNITY): Payer: Medicare Other | Attending: Cardiology | Admitting: Radiology

## 2014-01-10 DIAGNOSIS — Z992 Dependence on renal dialysis: Secondary | ICD-10-CM | POA: Insufficient documentation

## 2014-01-10 DIAGNOSIS — Z87891 Personal history of nicotine dependence: Secondary | ICD-10-CM | POA: Diagnosis not present

## 2014-01-10 DIAGNOSIS — Z79899 Other long term (current) drug therapy: Secondary | ICD-10-CM

## 2014-01-10 DIAGNOSIS — N186 End stage renal disease: Secondary | ICD-10-CM | POA: Insufficient documentation

## 2014-01-10 DIAGNOSIS — I340 Nonrheumatic mitral (valve) insufficiency: Secondary | ICD-10-CM | POA: Diagnosis not present

## 2014-01-10 DIAGNOSIS — I350 Nonrheumatic aortic (valve) stenosis: Secondary | ICD-10-CM | POA: Insufficient documentation

## 2014-01-10 DIAGNOSIS — I272 Other secondary pulmonary hypertension: Secondary | ICD-10-CM | POA: Insufficient documentation

## 2014-01-10 DIAGNOSIS — Z01818 Encounter for other preprocedural examination: Secondary | ICD-10-CM | POA: Insufficient documentation

## 2014-01-10 DIAGNOSIS — I429 Cardiomyopathy, unspecified: Secondary | ICD-10-CM | POA: Insufficient documentation

## 2014-01-10 NOTE — Progress Notes (Signed)
Echocardiogram performed.  

## 2014-01-11 ENCOUNTER — Telehealth: Payer: Self-pay | Admitting: *Deleted

## 2014-01-11 NOTE — Telephone Encounter (Signed)
[name] >','<< Less Detail',event)" href="javascript:;">More Detail >>   Josue Hector, MD   Sent: Thu January 10, 2014 8:37 PM    To: Richmond Campbell, LPN; Stark Klein, MD        Message     His echo is really bad not sure he would tolerate major abdominal surgery F/U with me to discuss I have asked for CHF clinic opinion        Diagnoses     Pre-op testing  ICD-9-CM: V72.84 ICD-10-CM: Z01.818      Reason for Visit     Reason for Visit History         Progress Notes      Laveda Norman at 01/10/2014 4:37 PM     Status: Signed       Expand All Collapse All   Echocardiogram performed.                Not recorded            Referring Provider     Josue Hector, MD            All Flowsheet Templates (all recorded)     Data       Referring Provider     Josue Hector, MD       All Charges for This Encounter     Code Description Service Date Service Provider Modifiers Otho Darner    [id number] [facility] TTE 2D ECHO 01/10/2014 Laveda Norman  1        Routing History     There are no sent or routed communications associated with this encounter.       AVS Reports     No AVS Snapshots are available for this encounter.       Smoking Cessation Audit Trail         Diabetic Foot Exam    No data filed       Diabetic Foot Form - Detailed    No data filed       Diabetic Foot Exam - Simple    No data filed       Guarantor Account: Anthonyjames, Bargar ([id number])     Relation to Patient: Account Type Service Area    Self Personal/Family [place] for This Account     Coverage ID Payor Plan Insurance ID    [phone] MEDICARE MEDICARE PART A AND B [id number]        Guarantor Account: Sage, Kopera ([id number])     Relation to Patient: Account Type Service Area    Self  Personal/Family [place] for This Account     Coverage ID Payor Plan Insurance ID    [id number] MEDICARE MEDICARE PART A AND B [id number]        Guarantor Account: Elsie, Sakuma ([id number])     Relation to Patient: Account Type Service Area    Self Personal/Family [place] for This Account     Coverage ID Payor Plan Insurance ID    [id number] MEDICARE MEDICARE PART A AND B [id number]        Guarantor Account: Claris, [place] ([id number])     Relation to Patient: Account Type Service Area    Self Personal/Family [facility]   SPOKE WITH  PT  AND PT'S  WIFE  RE  ECHO RESULTS   WILL  FORWARD  MESSAGE  TO  DR Johnsie Cancel  FIRST  AVAILABLE APPT  IS NOT  UNTIL  01-31-14 IS  THIS OKAY

## 2014-01-11 NOTE — Telephone Encounter (Signed)
Not sure what I was supposed to do with this??

## 2014-01-14 NOTE — Telephone Encounter (Signed)
FIRST  AVAILABLE  APPT WITH YOU IS NOT UNTIL  01-31-14 IS THIS  OKAY ?

## 2014-01-14 NOTE — Telephone Encounter (Signed)
See if flex PA can see him and set up for right and left heart cath  Cath can be with Burt Knack or Bens[facility]ohn as well

## 2014-01-15 ENCOUNTER — Ambulatory Visit (INDEPENDENT_AMBULATORY_CARE_PROVIDER_SITE_OTHER): Payer: Medicare Other | Admitting: Nurse Practitioner

## 2014-01-15 ENCOUNTER — Other Ambulatory Visit: Payer: Self-pay | Admitting: Nurse Practitioner

## 2014-01-15 ENCOUNTER — Encounter: Payer: Self-pay | Admitting: Nurse Practitioner

## 2014-01-15 VITALS — BP 85/49 | HR 98 | Ht 69.0 in | Wt 238.2 lb

## 2014-01-15 DIAGNOSIS — I429 Cardiomyopathy, unspecified: Secondary | ICD-10-CM

## 2014-01-15 DIAGNOSIS — I428 Other cardiomyopathies: Secondary | ICD-10-CM | POA: Insufficient documentation

## 2014-01-15 NOTE — Telephone Encounter (Signed)
PT AND PT'S WIFE  AWARE  WILL SEE CHRIS BERGE NP TODAY  AT    2:30 PM .Adonis Housekeeper

## 2014-01-15 NOTE — Patient Instructions (Addendum)
Your physician recommends that you continue on your current medications as directed. Please refer to the Current Medication list given to you today.   Your physician recommends that you schedule a follow-up appointment in:  WITH DR [place] OF 02/11/13 FOR POST CATH FOLLOW UP    Your physician recommends that you return for lab work in:  ON 1 /6/15 FOR LAB HERE AT [place]  CBC BMET AND INR    Your physician has requested that you have a cardiac catheterization. Cardiac catheterization is used to diagnose and/or treat various heart conditions. Doctors may recommend this procedure for a number of different reasons. The most common reason is to evaluate chest pain. Chest pain can be a symptom of coronary artery disease (CAD), and cardiac catheterization can show whether plaque is narrowing or blocking your heart's arteries. This procedure is also used to evaluate the valves, as well as measure the blood flow and oxygen levels in different parts of your heart. For further information please visit [email or web address]. Please follow instruction sheet, as given.  YOU ARE SCHEDULED TO HAVE A LEFT AND RIGHT HEART CATH ON 01/31/13 WITH DR COOPER  @ 1030 AM  PLEASE ARRIVE AT [place] @ 830 AM FOR PROCEDURE WITH NOTHING TO DRINK BUT WATER   WITH MORNING MEDS MORNING OF PROCEDURE THE NIGHT BEFORE PROCEDURE MAKE SURE YOU HAVE NOTHING TO DRINK OR EAT AFTER MIDNIGHT. PLEASE MAKE SURE YOU HAVE A CHANGE OF CLOTHING   AND SOMEONE TO [place] AFTERWARDS

## 2014-01-15 NOTE — Progress Notes (Signed)
Patient Name: [name] Date of Encounter: 01/15/2014  Primary Care Provider:  Estanislado Emms, MD Primary Cardiologist:  P. Johnsie Cancel, MD   Patient Profile  63 y/o male with a h/o NICM and subsequent recovery of LV fxn, who was recently seen by Dr. Johnsie Cancel for preop clearance and was found to have sev LV dysfxn by echo.  Problem List   Past Medical History  Diagnosis Date  . ESRD (end stage renal disease) 1996    a. BMS [place], M-W-F  . Anemia   . GERD (gastroesophageal reflux disease)   . Nonischemic cardiomyopathy     a. dx 2005;  b. 04/2009 Echo: EF  >55%;  c. 12/2013 Echo: EF 20-25%, diff HK, inflat/infsept/inf AK. Mild AS, mod MR, sev dil LA, PASP 47 mmHg.  Marland Kitchen Perirectal abscess     a. s/p I & D 2013.  Marland Kitchen Secondary hyperparathyroidism   . Chronic obstructive pulmonary disease   . Retroperitoneal mass     a. 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   Past Surgical History  Procedure Laterality Date  . Insertion of dialysis catheter    . Av fistula placement      Numerous fistulas placed in both arms and left leg; using rt arm fistula presently  . Examination under anesthesia  03/18/2011    Procedure: EXAM UNDER ANESTHESIA;  Surgeon: [name], MD;  Location: [facility];  Service: General;  Laterality: N/A;  exam under anesthesia, incision and drainage of perirectal abscess  . Parathyroidectomy      Allergies  No Known Allergies  HPI  62 y/o male with the above complex problem list.  He has a h/o NICM dating back to 2005 - full details are not available.  Echo in 2011 showed nl LV fxn and overall he had done well.  Over the past year, he has unintentionally lost ~ 30 lbs.  He was referred to GI and a CT was performed and revealed a large right retroperitoneal mass.  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  Pathology has shown the latter.  He  saw Dr. Johnsie Cancel last week for preop eval and an echo was ordered to f/u LV fxn.  This has shown severe LV dysfxn with an EF of 20-25% with diff HK and inferior AK.  Based on this finding, pt was contacted and advised to present to clinic today to discuss results and schedule R and L heart cath in the near future.  From a symptom standpoing, Mr. Broad has no h/o chest pain.  He does experience DOE and per his wife, this has progressed over many years.  He now must take frequent breaks when ambulating.  He has chronic lower ext edema.  He denies palpitations, pnd, orthopnea, n, v, dizziness, weight gain, or early satiety.  He does have a h/o frequent falls, at least twice/month, and also has a h/o syncope - most often after standing up in the setting of known orthostasis and baseline relative hypotension for which he takes midodrine.  Home Medications  Prior to Admission medications   Medication Sig Start Date End Date Taking? Authorizing Provider  lanthanum (FOSRENOL) 500 MG chewable tablet Chew 1,000 mg by mouth 3 (three) times daily with meals.   Yes Historical Provider, MD  midodrine (PROAMATINE) 10 MG tablet Take 10 mg by mouth 3 (three) times daily.  Yes Historical Provider, MD    Family History  Family History  Problem Relation Age of Onset  . Heart failure Mother   . Heart failure Father   . Heart attack Brother     Social History  History   Social History  . Marital Status: Married    Spouse Name: N/A    Number of Children: N/A  . Years of Education: N/A   Occupational History  . retired    Social History Main Topics  . Smoking status: Former Smoker -- 2.00 packs/day for 15 years    Types: Cigarettes    Quit date: 03/11/1983  . Smokeless tobacco: Never Used  . Alcohol Use: No  . Drug Use: No  . Sexual Activity: Not on file   Other Topics Concern  . Not on file   Social History Narrative   Lives in [place] with his wife.  Does not routinely exercise.      Review of Systems General:  No chills, fever, night sweats.  +++ wt loss. Cardiovascular:  No chest pain, +++ dyspnea on exertion, +++ edema, no orthopnea, palpitations, paroxysmal nocturnal dyspnea. +++ h/o falls and orthostatic syncope. Dermatological: No rash, lesions/masses Respiratory: No cough, dyspnea Urologic: No hematuria, dysuria Abdominal:   No nausea, vomiting, diarrhea, bright red blood per rectum, melena, or hematemesis Neurologic:  No visual changes, wkns, changes in mental status. All other systems reviewed and are otherwise negative except as noted above.  Physical Exam  Blood pressure 85/49, pulse 98, height 5\' 9"  (1.753 m), weight 238 lb 3.2 oz (108.047 kg).  General: Pleasant, NAD Psych: Normal affect. Neuro: Alert and oriented X 3. Moves all extremities spontaneously. HEENT: Normal  Neck: Supple without bruits or JVD. Lungs:  Resp regular and unlabored, bibasilar crackles. Heart: RRR no s3, s4, 2/6 syst murmur throughout chest- ? Radiated AVF bruit. Abdomen: Soft, non-tender, non-distended, BS + x 4.  Extremities: No clubbing, cyanosis, 1+ bilat LE to edema to knees. DP/PT/Radials 1+ and equal bilaterally.  Accessory Clinical Findings  ECG - RSR, 98, left axis, ivcd, nonspecific st/t changes - no acute changes.  Assessment & Plan  1.  Cardiomyopathy:  Presumed to be NICM given prior h/o NICM, though he prev had recovery of LV fxn by echo in 2011.  Most recent echo again shows LV dysfxn with an EF of 20-25% with diff HK and inf AK.  Echo was done for preop clearance and as this finding significantly impacts his risk for surgery, we will need to pursue diagnostic R and L heart cath to better understand the etiology of his CM and r/o obstructive coronary dzs.  From a volume standpoint, he has chronic lower ext edema and volume is managed in dialysis.  Volume mgmt is complicated by baseline hypotn and orthostasis req midodrine.  On that setting, he is not a candidate  for bb/acei/arb/arni/spiro.  He would like to defer cath until after the holidays and thus we have arranged for labs on 1/6 with cath with Dr. Burt Knack on 1/7.  2.  ESRD:  MWF dialysis.  3.  Orthostatic hypotn w/ h/o syncope:  On midodrine.  4.  Dispo:  Cath 1/7.  F/U with Dr. Johnsie Cancel following cath.   Murray Hodgkins, NP 01/15/2014, 3:37 PM

## 2014-01-30 ENCOUNTER — Other Ambulatory Visit (INDEPENDENT_AMBULATORY_CARE_PROVIDER_SITE_OTHER): Payer: Medicare Other | Admitting: *Deleted

## 2014-01-30 DIAGNOSIS — I429 Cardiomyopathy, unspecified: Secondary | ICD-10-CM | POA: Diagnosis not present

## 2014-01-30 LAB — BASIC METABOLIC PANEL
BUN: 17 mg/dL (ref 6–23)
CO2: 33 mEq/L — ABNORMAL HIGH (ref 19–32)
Calcium: 9.1 mg/dL (ref 8.4–10.5)
Chloride: 97 mEq/L (ref 96–112)
Creatinine, Ser: 3.9 mg/dL — ABNORMAL HIGH (ref 0.4–1.5)
GFR: 20.57 mL/min — AB (ref >60.00)
Glucose, Bld: 87 mg/dL (ref 70–99)
Potassium: 3.5 mEq/L (ref 3.5–5.1)
SODIUM: 138 meq/L (ref 135–145)

## 2014-01-30 LAB — CBC
HCT: 33.8 % — ABNORMAL LOW (ref 39.0–52.0)
Hemoglobin: 11 g/dL — ABNORMAL LOW (ref 13.0–17.0)
MCHC: 32.5 g/dL (ref 30.0–36.0)
MCV: 91.4 fl (ref 78.0–100.0)
PLATELETS: 126 10*3/uL — AB (ref 150.0–400.0)
RBC: 3.7 Mil/uL — AB (ref 4.22–5.81)
RDW: 16.6 % — AB (ref 11.5–15.5)
WBC: 4.8 10*3/uL (ref 4.0–10.5)

## 2014-01-30 LAB — PROTIME-INR
INR: 1.1 ratio — ABNORMAL HIGH (ref 0.8–1.0)
PROTHROMBIN TIME: 12.2 s (ref 9.6–13.1)

## 2014-01-31 ENCOUNTER — Encounter (HOSPITAL_COMMUNITY)
Admission: RE | Disposition: A | Discharge status: Home / Self Care | Payer: Self-pay | Source: Ambulatory Visit | Attending: Cardiovascular Disease

## 2014-01-31 ENCOUNTER — Ambulatory Visit (HOSPITAL_COMMUNITY)
Admission: RE | Admit: 2014-01-31 | Discharge: 2014-01-31 | Disposition: A | Discharge status: Home / Self Care | Payer: Medicare Other | Source: Ambulatory Visit | Attending: Cardiovascular Disease | Admitting: Cardiovascular Disease

## 2014-01-31 ENCOUNTER — Encounter (HOSPITAL_COMMUNITY): Payer: Self-pay | Admitting: Cardiovascular Disease

## 2014-01-31 DIAGNOSIS — N186 End stage renal disease: Secondary | ICD-10-CM | POA: Diagnosis not present

## 2014-01-31 DIAGNOSIS — Z87891 Personal history of nicotine dependence: Secondary | ICD-10-CM | POA: Diagnosis not present

## 2014-01-31 DIAGNOSIS — Z8249 Family history of ischemic heart disease and other diseases of the circulatory system: Secondary | ICD-10-CM | POA: Insufficient documentation

## 2014-01-31 DIAGNOSIS — I509 Heart failure, unspecified: Secondary | ICD-10-CM | POA: Diagnosis present

## 2014-01-31 DIAGNOSIS — K219 Gastro-esophageal reflux disease without esophagitis: Secondary | ICD-10-CM | POA: Diagnosis not present

## 2014-01-31 DIAGNOSIS — I358 Other nonrheumatic aortic valve disorders: Secondary | ICD-10-CM | POA: Insufficient documentation

## 2014-01-31 DIAGNOSIS — I429 Cardiomyopathy, unspecified: Secondary | ICD-10-CM | POA: Insufficient documentation

## 2014-01-31 DIAGNOSIS — N2581 Secondary hyperparathyroidism of renal origin: Secondary | ICD-10-CM | POA: Diagnosis not present

## 2014-01-31 DIAGNOSIS — I350 Nonrheumatic aortic (valve) stenosis: Secondary | ICD-10-CM

## 2014-01-31 DIAGNOSIS — I2510 Atherosclerotic heart disease of native coronary artery without angina pectoris: Secondary | ICD-10-CM | POA: Diagnosis not present

## 2014-01-31 DIAGNOSIS — J449 Chronic obstructive pulmonary disease, unspecified: Secondary | ICD-10-CM | POA: Insufficient documentation

## 2014-01-31 HISTORY — PX: LEFT AND RIGHT HEART CATHETERIZATION WITH CORONARY ANGIOGRAM: SHX5449

## 2014-01-31 LAB — POCT I-STAT 3, VENOUS BLOOD GAS (G3P V)
ACID-BASE EXCESS: 4 mmol/L — AB (ref 0.0–2.0)
Bicarbonate: 29.2 mEq/L — ABNORMAL HIGH (ref 20.0–24.0)
O2 SAT: 66 %
PCO2 VEN: 43.5 mmHg — AB (ref 45.0–50.0)
TCO2: 31 mmol/L (ref 0–100)
pH, Ven: 7.436 — ABNORMAL HIGH (ref 7.250–7.300)
pO2, Ven: 33 mmHg (ref 30.0–45.0)

## 2014-01-31 LAB — POCT I-STAT 3, ART BLOOD GAS (G3+)
ACID-BASE EXCESS: 4 mmol/L — AB (ref 0.0–2.0)
Bicarbonate: 28.1 mEq/L — ABNORMAL HIGH (ref 20.0–24.0)
O2 Saturation: 97 %
PH ART: 7.445 (ref 7.350–7.450)
PO2 ART: 84 mmHg (ref 80.0–100.0)
TCO2: 29 mmol/L (ref 0–100)
pCO2 arterial: 40.8 mmHg (ref 35.0–45.0)

## 2014-01-31 SURGERY — LEFT AND RIGHT HEART CATHETERIZATION WITH CORONARY ANGIOGRAM
Anesthesia: LOCAL

## 2014-01-31 MED ORDER — SODIUM CHLORIDE 0.9 % IJ SOLN: 3.0000 mL | Freq: Two times a day (BID) | INTRAMUSCULAR | Status: DC

## 2014-01-31 MED ORDER — NITROGLYCERIN 1 MG/10 ML FOR IR/CATH LAB
INTRA_ARTERIAL | Status: AC
  Filled 2014-01-31: qty 10

## 2014-01-31 MED ORDER — HEPARIN (PORCINE) IN NACL 2-0.9 UNIT/ML-% IJ SOLN
INTRAMUSCULAR | Status: AC
  Filled 2014-01-31: qty 1500

## 2014-01-31 MED ORDER — LIDOCAINE HCL (PF) 1 % IJ SOLN
INTRAMUSCULAR | Status: AC
  Filled 2014-01-31: qty 30

## 2014-01-31 MED ORDER — SODIUM CHLORIDE 0.9 % IV SOLN: 250.0000 mL | INTRAVENOUS | Status: DC | PRN

## 2014-01-31 MED ORDER — ASPIRIN 81 MG PO CHEW
CHEWABLE_TABLET | ORAL | Status: AC
  Filled 2014-01-31: qty 1

## 2014-01-31 MED ORDER — SODIUM CHLORIDE 0.9 % IJ SOLN: 3.0000 mL | INTRAMUSCULAR | Status: DC | PRN

## 2014-01-31 MED ORDER — SODIUM CHLORIDE 0.9 % IJ SOLN
3.0000 mL | INTRAMUSCULAR | Status: DC | PRN
  Administered 2014-01-31: 3 mL via INTRAVENOUS
  Filled 2014-01-31: qty 3

## 2014-01-31 MED ORDER — ACETAMINOPHEN 325 MG PO TABS: 650.0000 mg | ORAL_TABLET | ORAL | Status: DC | PRN

## 2014-01-31 MED ORDER — ASPIRIN 81 MG PO CHEW
81.0000 mg | CHEWABLE_TABLET | ORAL | Status: AC
  Administered 2014-01-31: 81 mg via ORAL

## 2014-01-31 MED ORDER — MIDAZOLAM HCL 2 MG/2ML IJ SOLN
INTRAMUSCULAR | Status: AC
  Filled 2014-01-31: qty 2

## 2014-01-31 MED ORDER — ONDANSETRON HCL 4 MG/2ML IJ SOLN: 4.0000 mg | Freq: Four times a day (QID) | INTRAMUSCULAR | Status: DC | PRN

## 2014-01-31 NOTE — Progress Notes (Signed)
Order for sheath removal verified per post procedural orders. Procedure explained to patient and Rt femoral artery access site assessed: level 0, palpable dorsalis pedis and posterior tibial pulses. 5FR arterial and 7 French venous Sheath removed and manual pressure applied for 25 minutes. Pre, peri, & post procedural vitals, see flow sheet, Pain 0. Distal pulses remained intact after sheath removal. Access site level 0 and dressed with 4X4 gauze and tegaderm.  Adline Mango, RN confirmed condition of site. Post procedural instructions discussed with return demonstration from patient. Bed rest starts at 1235. Patient transported to short stay

## 2014-01-31 NOTE — CV Procedure (Signed)
    Cardiac Catheterization Procedure Note  Name: [name] MRN: [id number] DOB: [birth year]  Procedure: Right Heart Cath, Left Heart Cath, Selective Coronary Angiography  Indication: Heart failure, severe cardiomyopathy, aortic stenosis   Procedural Details: The right groin was prepped, draped, and anesthetized with 1% lidocaine. Using the modified Seldinger technique a 5 French sheath was placed in the right femoral artery and a 7 French sheath was placed in the right femoral vein. A Swan-Ganz catheter was used for the right heart catheterization. Standard protocol was followed for recording of right heart pressures and sampling of oxygen saturations. Fick cardiac output was calculated. Standard Judkins catheters were used for selective coronary angiography. The aortic valve was moderately difficult to cross but I was able to cross with a J-wire and pigtail catheter. There were no immediate procedural complications. The patient was transferred to the post catheterization recovery area for further monitoring.  Procedural Findings: Hemodynamics RA 11 RV 49/19 PA 45/14 mean 32 PCWP 27 LV 89/28 AO 78/50 mean 61  Oxygen saturations: PA 66 AO 97  Cardiac Output (Fick) 6.3  Cardiac Index (Fick) 2.9   Aortic valve hemodynamics: Mean gradient 10.4 mmHg, peak-peak gradient 11 mmHg, AVA 2.07 square cm  Coronary angiography: Coronary dominance: right  Left mainstem: The left main is patent with mild calcification and mild irregularity without significant stenosis.  Left anterior descending (LAD): The LAD is a large vessel that wraps around the left ventricular apex. There are mild irregularities in the proximal and mid vessel without significant stenoses. The diagonal branches are patent without significant stenoses.  Left circumflex (LCx): The left circumflex is patent. It gives off a large first obtuse marginal branch and the left posterolateral branch.  Right coronary artery  (RCA): The RCA is medium in caliber. The vessel has mild irregularities without significant stenosis. The PDA branch is patent. There is mild diffuse calcification noted.  Left ventriculography: Deferred. On plain fluoroscopy, the aortic valve is moderately calcified.  Estimated Blood Loss: Minimal  Final Conclusions:   1. Widely patent coronary arteries with minimal nonobstructive CAD 2. Severe nonischemic cardiomyopathy by echo 3. Elevated right and left heart filling pressures with systemic hypotension 4. Calcified aortic valve with probable moderate low gradient aortic stenosis  Recommendations: The patient will follow-up with Dr. Johnsie Cancel. He has been evaluated for preoperative cardiac clearance. The findings of severe LV dysfunction and markedly elevated intracardiac filling pressures are poor prognostic signs in this patient on hemodialysis with systemic hypotension which does not allow for aggressive medical management.  Sherren Mocha 01/31/2014, 11:57 AM

## 2014-01-31 NOTE — Interval H&P Note (Signed)
History and Physical Interval Note:  01/31/2014 11:23 AM  [name]  has presented today for surgery, with the diagnosis of cardiomyopathy  The various methods of treatment have been discussed with the patient and family. After consideration of risks, benefits and other options for treatment, the patient has consented to  Procedure(s): LEFT AND RIGHT HEART CATHETERIZATION WITH CORONARY ANGIOGRAM (N/A) as a surgical intervention .  The patient's history has been reviewed, patient examined, no change in status, stable for surgery.  I have reviewed the patient's chart and labs.  Questions were answered to the patient's satisfaction.     Sherren Mocha

## 2014-01-31 NOTE — Discharge Instructions (Signed)
Angiogram, Care After °Refer to this sheet in the next few weeks. These instructions provide you with information on caring for yourself after your procedure. Your health care provider may also give you more specific instructions. Your treatment has been planned according to current medical practices, but problems sometimes occur. Call your health care provider if you have any problems or questions after your procedure.  °WHAT TO EXPECT AFTER THE PROCEDURE °After your procedure, it is typical to have the following sensations: °· Minor discomfort or tenderness and a small bump at the catheter insertion site. The bump should usually decrease in size and tenderness within 1 to 2 weeks. °· Any bruising will usually fade within 2 to 4 weeks. °HOME CARE INSTRUCTIONS  °· You may need to keep taking blood thinners if they were prescribed for you. Take medicines only as directed by your health care provider. °· Do not apply powder or lotion to the site. °· Do not take baths, swim, or use a hot tub until your health care provider approves. °· You may shower 24 hours after the procedure. Remove the bandage (dressing) and gently wash the site with plain soap and water. Gently pat the site dry. °· Inspect the site at least twice daily. °· Limit your activity for the first 48 hours. Do not bend, squat, or lift anything over 20 lb (9 kg) or as directed by your health care provider. °· Plan to have someone take you home after the procedure. Follow instructions about when you can drive or return to work. °SEEK MEDICAL CARE IF: °· You get light-headed when standing up. °· You have drainage (other than a small amount of blood on the dressing). °· You have chills. °· You have a fever. °· You have redness, warmth, swelling, or pain at the insertion site. °SEEK IMMEDIATE MEDICAL CARE IF:  °· You develop chest pain or shortness of breath, feel faint, or pass out. °· You have bleeding, swelling larger than a walnut, or drainage from the  catheter insertion site. °· You develop pain, discoloration, coldness, or severe bruising in the leg or arm that held the catheter. °· You have heavy bleeding from the site. If this happens, hold pressure on the site and call 911. °MAKE SURE YOU: °· Understand these instructions. °· Will watch your condition. °· Will get help right away if you are not doing well or get worse. °Document Released: 07/30/2004 Document Revised: 05/28/2013 Document Reviewed: 06/05/2012 °ExitCare® Patient Information ©2015 [employer]. This information is not intended to replace advice given to you by your health care provider. Make sure you discuss any questions you have with your health care provider. ° °

## 2014-01-31 NOTE — H&P (View-Only) (Signed)
Patient Name: [name] Date of Encounter: 01/15/2014  Primary Care Provider:  Estanislado Emms, MD Primary Cardiologist:  P. Johnsie Cancel, MD   Patient Profile  64 y/o male with a h/o NICM and subsequent recovery of LV fxn, who was recently seen by Dr. Johnsie Cancel for preop clearance and was found to have sev LV dysfxn by echo.  Problem List   Past Medical History  Diagnosis Date  . ESRD (end stage renal disease) 1996    a. BMS [place], M-W-F  . Anemia   . GERD (gastroesophageal reflux disease)   . Nonischemic cardiomyopathy     a. dx 2005;  b. 04/2009 Echo: EF  >55%;  c. 12/2013 Echo: EF 20-25%, diff HK, inflat/infsept/inf AK. Mild AS, mod MR, sev dil LA, PASP 47 mmHg.  Marland Kitchen Perirectal abscess     a. s/p I & D 2013.  Marland Kitchen Secondary hyperparathyroidism   . Chronic obstructive pulmonary disease   . Retroperitoneal mass     a. 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   Past Surgical History  Procedure Laterality Date  . Insertion of dialysis catheter    . Av fistula placement      Numerous fistulas placed in both arms and left leg; using rt arm fistula presently  . Examination under anesthesia  03/18/2011    Procedure: EXAM UNDER ANESTHESIA;  Surgeon: [name], MD;  Location: [facility];  Service: General;  Laterality: N/A;  exam under anesthesia, incision and drainage of perirectal abscess  . Parathyroidectomy      Allergies  No Known Allergies  HPI  64 y/o male with the above complex problem list.  He has a h/o NICM dating back to 2005 - full details are not available.  Echo in 2011 showed nl LV fxn and overall he had done well.  Over the past year, he has unintentionally lost ~ 30 lbs.  He was referred to GI and a CT was performed and revealed a large right retroperitoneal mass.  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  Pathology has shown the latter.  He  saw Dr. Johnsie Cancel last week for preop eval and an echo was ordered to f/u LV fxn.  This has shown severe LV dysfxn with an EF of 20-25% with diff HK and inferior AK.  Based on this finding, pt was contacted and advised to present to clinic today to discuss results and schedule R and L heart cath in the near future.  From a symptom standpoing, [name] has no h/o chest pain.  He does experience DOE and per his wife, this has progressed over many years.  He now must take frequent breaks when ambulating.  He has chronic lower ext edema.  He denies palpitations, pnd, orthopnea, n, v, dizziness, weight gain, or early satiety.  He does have a h/o frequent falls, at least twice/month, and also has a h/o syncope - most often after standing up in the setting of known orthostasis and baseline relative hypotension for which he takes midodrine.  Home Medications  Prior to Admission medications   Medication Sig Start Date End Date Taking? Authorizing Provider  lanthanum (FOSRENOL) 500 MG chewable tablet Chew 1,000 mg by mouth 3 (three) times daily with meals.   Yes Historical Provider, MD  midodrine (PROAMATINE) 10 MG tablet Take 10 mg by mouth 3 (three) times daily.  Yes Historical Provider, MD    Family History  Family History  Problem Relation Age of Onset  . Heart failure Mother   . Heart failure Father   . Heart attack Brother     Social History  History   Social History  . Marital Status: Married    Spouse Name: N/A    Number of Children: N/A  . Years of Education: N/A   Occupational History  . retired    Social History Main Topics  . Smoking status: Former Smoker -- 2.00 packs/day for 15 years    Types: Cigarettes    Quit date: 03/11/1983  . Smokeless tobacco: Never Used  . Alcohol Use: No  . Drug Use: No  . Sexual Activity: Not on file   Other Topics Concern  . Not on file   Social History Narrative   Lives in [place] with his wife.  Does not routinely exercise.      Review of Systems General:  No chills, fever, night sweats.  +++ wt loss. Cardiovascular:  No chest pain, +++ dyspnea on exertion, +++ edema, no orthopnea, palpitations, paroxysmal nocturnal dyspnea. +++ h/o falls and orthostatic syncope. Dermatological: No rash, lesions/masses Respiratory: No cough, dyspnea Urologic: No hematuria, dysuria Abdominal:   No nausea, vomiting, diarrhea, bright red blood per rectum, melena, or hematemesis Neurologic:  No visual changes, wkns, changes in mental status. All other systems reviewed and are otherwise negative except as noted above.  Physical Exam  Blood pressure 85/49, pulse 98, height 5\' 9"  (1.753 m), weight 238 lb 3.2 oz (108.047 kg).  General: Pleasant, NAD Psych: Normal affect. Neuro: Alert and oriented X 3. Moves all extremities spontaneously. HEENT: Normal  Neck: Supple without bruits or JVD. Lungs:  Resp regular and unlabored, bibasilar crackles. Heart: RRR no s3, s4, 2/6 syst murmur throughout chest- ? Radiated AVF bruit. Abdomen: Soft, non-tender, non-distended, BS + x 4.  Extremities: No clubbing, cyanosis, 1+ bilat LE to edema to knees. DP/PT/Radials 1+ and equal bilaterally.  Accessory Clinical Findings  ECG - RSR, 98, left axis, ivcd, nonspecific st/t changes - no acute changes.  Assessment & Plan  1.  Cardiomyopathy:  Presumed to be NICM given prior h/o NICM, though he prev had recovery of LV fxn by echo in 2011.  Most recent echo again shows LV dysfxn with an EF of 20-25% with diff HK and inf AK.  Echo was done for preop clearance and as this finding significantly impacts his risk for surgery, we will need to pursue diagnostic R and L heart cath to better understand the etiology of his CM and r/o obstructive coronary dzs.  From a volume standpoint, he has chronic lower ext edema and volume is managed in dialysis.  Volume mgmt is complicated by baseline hypotn and orthostasis req midodrine.  On that setting, he is not a candidate  for bb/acei/arb/arni/spiro.  He would like to defer cath until after the holidays and thus we have arranged for labs on 1/6 with cath with Dr. Burt Knack on 1/7.  2.  ESRD:  MWF dialysis.  3.  Orthostatic hypotn w/ h/o syncope:  On midodrine.  4.  Dispo:  Cath 1/7.  F/U with Dr. Johnsie Cancel following cath.   Murray Hodgkins, NP 01/15/2014, 3:37 PM

## 2014-02-01 DIAGNOSIS — I429 Cardiomyopathy, unspecified: Secondary | ICD-10-CM | POA: Insufficient documentation

## 2014-02-05 ENCOUNTER — Telehealth: Payer: Self-pay | Admitting: *Deleted

## 2014-02-05 NOTE — Telephone Encounter (Signed)
[name] - 01/15/14 >','<< Less Detail',event)" href="javascript:;">More Detail >>   Austin Hector, MD   Sent: Fri February 01, 2014 10:52 AM    To: Austin Artist, MD; Austin Dresser, MD; Austin Campbell, LPN        Message     EF bad and hemodynamics poor should not have major abdominal surgery. Refer to advanced heart failure clinic as dialysis complicates care            ----- Message -----     From: Austin Ohara, MD     Sent: 01/31/2014 12:06 PM      To: Austin Hector, MD, Austin Mire, NP        [name] and [name] - things aren't looking too favorable for [name]. SBP is about 80 on midodrine and cardiac pressures are high. Not sure they'll be able to be much more aggressive with dialysis or med Rx because of hypotension. He does have aortic stenosis, valve looks bad on echo but gradients are low - I don't think it's severe.        [name]            Patient Information     Patient Name Sex DOB SSN    [name], [name] Male 20-Jul-1950 [id number]        CV Procedure by Austin Ohara, MD at 01/31/2014 11:57 AM     Author: Blane Ohara, MD Service: Cardiology Author Type: Physician    Filed: 01/31/2014 12:04 PM Note Time: 01/31/2014 11:57 AM Status: Signed    Editor: Austin Ohara, MD (Physician)      Expand All Collapse All      Cardiac Catheterization Procedure Note  Name: [name] MRN: [id number] DOB: [birth year]  Procedure: Right Heart Cath, Left Heart Cath, Selective Coronary Angiography  Indication: Heart failure, severe cardiomyopathy, aortic stenosis  Procedural Details: The right groin was prepped, draped, and anesthetized with 1% lidocaine. Using the modified Seldinger technique a 5 French sheath was placed in the right femoral artery and a 7 French sheath was placed in the right femoral vein. A Swan-Ganz catheter was used for the right heart catheterization.  Standard protocol was followed for recording of right heart pressures and sampling of oxygen saturations. Fick cardiac output was calculated. Standard Judkins catheters were used for selective coronary angiography. The aortic valve was moderately difficult to cross but I was able to cross with a J-wire and pigtail catheter. There were no immediate procedural complications. The patient was transferred to the post catheterization recovery area for further monitoring.  Procedural Findings: Hemodynamics RA 11 RV 49/19 PA 45/14 mean 32 PCWP 27 LV 89/28 AO 78/50 mean 61  Oxygen saturations: PA 66 AO 97  Cardiac Output (Fick) 6.3  Cardiac Index (Fick) 2.9  Aortic valve hemodynamics: Mean gradient 10.4 mmHg, peak-peak gradient 11 mmHg, AVA 2.07 square cm  Coronary angiography: Coronary dominance: right  Left mainstem: The left main is patent with mild calcification and mild irregularity without significant stenosis.  Left anterior descending (LAD): The LAD is a large vessel that wraps around the left ventricular apex. There are mild irregularities in the proximal and mid vessel without significant stenoses. The diagonal branches are patent without significant stenoses.  Left circumflex (LCx): The left circumflex is patent. It gives off a large first obtuse marginal branch and the left posterolateral branch.  Right coronary artery (RCA): The RCA is medium in caliber. The vessel  has mild irregularities without significant stenosis. The PDA branch is patent. There is mild diffuse calcification noted.  Left ventriculography: Deferred. On plain fluoroscopy, the aortic valve is moderately calcified.  Estimated Blood Loss: Minimal  Final Conclusions:  1. Widely patent coronary arteries with minimal nonobstructive CAD 2. Severe nonischemic cardiomyopathy by echo 3. Elevated right and left heart filling pressures with systemic hypotension 4. Calcified aortic valve with probable  moderate low gradient aortic stenosis  Recommendations: The patient will follow-up with [name]. He has been evaluated for preoperative cardiac clearance. The findings of severe LV dysfunction and markedly elevated intracardiac filling pressures are poor prognostic signs in this patient on hemodialysis with systemic hypotension which does not allow for aggressive medical management.  [name] 01/31/2014, 11:57 AM          LMTCB .[name]

## 2014-02-07 ENCOUNTER — Ambulatory Visit: Payer: Medicare Other

## 2014-02-08 NOTE — Telephone Encounter (Signed)
LMTCB ./CY 

## 2014-02-14 ENCOUNTER — Encounter: Payer: Self-pay | Admitting: Cardiovascular Disease

## 2014-02-14 ENCOUNTER — Ambulatory Visit (INDEPENDENT_AMBULATORY_CARE_PROVIDER_SITE_OTHER): Payer: Medicare Other | Admitting: Cardiovascular Disease

## 2014-02-14 VITALS — HR 93 | Ht 69.0 in | Wt 238.0 lb

## 2014-02-14 DIAGNOSIS — I420 Dilated cardiomyopathy: Secondary | ICD-10-CM

## 2014-02-14 NOTE — Telephone Encounter (Signed)
PT  WAS SEEN  TODAY  ./CY

## 2014-02-14 NOTE — Patient Instructions (Signed)
Your physician wants you to follow-up in:   [name]  ECHO [place] will receive a reminder letter in the mail two months in advance. If you don't receive a letter, please call our office to schedule the follow-up appointment. Your physician recommends that you continue on your current medications as directed. Please refer to the Current Medication list given to you today.  Your physician has requested that you have an echocardiogram. Echocardiography is a painless test that uses sound waves to create images of your heart. It provides your doctor with information about the size and shape of your heart and how well your heart's chambers and valves are working. This procedure takes approximately one hour. There are no restrictions for this procedure.  6 MONTHS

## 2014-02-14 NOTE — Progress Notes (Signed)
Patient ID: [name], male   DOB: [birth year], 64 y.o.   MRN: [id number]   Patient Name: [name] Date of Encounter: 02/14/2014  Primary Care Provider:  Estanislado Emms, MD Primary Cardiologist:  P. Johnsie Cancel, MD   Patient Profile  64 y/o male with a h/o NICM and subsequent recovery of LV fxn, who was recently seen by Dr. Johnsie Cancel for preop clearance and was found to have sev LV dysfxn by echo.  Problem List   Past Medical History  Diagnosis Date  . ESRD (end stage renal disease) 1996    a. BMS [place], M-W-F  . Anemia   . GERD (gastroesophageal reflux disease)   . Nonischemic cardiomyopathy     a. dx 2005;  b. 04/2009 Echo: EF  >55%;  c. 12/2013 Echo: EF 20-25%, diff HK, inflat/infsept/inf AK. Mild AS, mod MR, sev dil LA, PASP 47 mmHg.  Marland Kitchen Perirectal abscess     a. s/p I & D 2013.  Marland Kitchen Secondary hyperparathyroidism   . Chronic obstructive pulmonary disease   . Retroperitoneal mass     a. 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   Past Surgical History  Procedure Laterality Date  . Insertion of dialysis catheter    . Av fistula placement      Numerous fistulas placed in both arms and left leg; using rt arm fistula presently  . Examination under anesthesia  03/18/2011    Procedure: EXAM UNDER ANESTHESIA;  Surgeon: [name], MD;  Location: [facility];  Service: General;  Laterality: N/A;  exam under anesthesia, incision and drainage of perirectal abscess  . Parathyroidectomy    . Left and right heart catheterization with coronary angiogram N/A 01/31/2014    Procedure: LEFT AND RIGHT HEART CATHETERIZATION WITH CORONARY ANGIOGRAM;  Surgeon: Blane Ohara, MD;  Location: [facility] C[facility];  Service: Cardiovascular;  Laterality: N/A;    Allergies  No Known Allergies  HPI  64 y/o male with the above complex problem list.  He has a h/o NICM dating back to 2005 - full details  are not available.  Echo in 2011 showed nl LV fxn and overall he had done well.  Over the past year, he has unintentionally lost ~ 30 lbs.  He was referred to GI and a CT was performed and revealed a large right retroperitoneal mass.  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  Pathology has shown the latter.  He saw me in December  for preop eval and an echo was ordered to f/u LV fxn.  This has shown severe LV dysfxn with an EF of 20-25% with diff HK and inferior AK.    From a symptom standpoing, [name] has no h/o chest pain.  He does experience DOE and per his wife, this has progressed over many years.  He now must take frequent breaks when ambulating.  He has chronic lower ext edema.  He denies palpitations, pnd, orthopnea, n, v, dizziness, weight gain, or early satiety.  He does have a h/o frequent falls, at least twice/month, and also has a h/o syncope - most often after standing up in the setting of known orthostasis and baseline relative hypotension for which he takes midodrine.  Right and Left cath done by Dr Burt Knack  01/31/14 No CAD but high filling pressures and severe LV dysfunction with moderate functional AS  Final Conclusions:  1. Widely patent coronary arteries with minimal nonobstructive CAD 2. Severe nonischemic cardiomyopathy by echo 3. Elevated right and left heart filling pressures with systemic hypotension 4. Calcified aortic valve with probable moderate low gradient aortic stenosis  Recommendations: The patient will follow-up with Dr. Johnsie Cancel. He has been evaluated for preoperative cardiac clearance. The findings of severe LV dysfunction and markedly elevated intracardiac filling pressures are poor prognostic signs in this patient on hemodialysis with systemic hypotension which does not allow for aggressive medical management.    Home Medications  Prior to Admission medications   Medication Sig Start Date End Date Taking? Authorizing Provider  lanthanum  (FOSRENOL) 500 MG chewable tablet Chew 1,000 mg by mouth 3 (three) times daily with meals.   Yes Historical Provider, MD  midodrine (PROAMATINE) 10 MG tablet Take 10 mg by mouth 3 (three) times daily.    Yes Historical Provider, MD    Family History  Family History  Problem Relation Age of Onset  . Heart failure Mother   . Heart failure Father   . Heart attack Brother     Social History  History   Social History  . Marital Status: Married    Spouse Name: N/A    Number of Children: N/A  . Years of Education: N/A   Occupational History  . retired    Social History Main Topics  . Smoking status: Former Smoker -- 2.00 packs/day for 15 years    Types: Cigarettes    Quit date: 03/11/1983  . Smokeless tobacco: Never Used  . Alcohol Use: No  . Drug Use: No  . Sexual Activity: Not on file   Other Topics Concern  . Not on file   Social History Narrative   Lives in [place] with his wife.  Does not routinely exercise.     Review of Systems General:  No chills, fever, night sweats.  +++ wt loss. Cardiovascular:  No chest pain, +++ dyspnea on exertion, +++ edema, no orthopnea, palpitations, paroxysmal nocturnal dyspnea. +++ h/o falls and orthostatic syncope. Dermatological: No rash, lesions/masses Respiratory: No cough, dyspnea Urologic: No hematuria, dysuria Abdominal:   No nausea, vomiting, diarrhea, bright red blood per rectum, melena, or hematemesis Neurologic:  No visual changes, wkns, changes in mental status. All other systems reviewed and are otherwise negative except as noted above.  Physical Exam  Pulse 93, height 5\' 9"  (1.753 m), weight 107.956 kg (238 lb).  General: Ple[place], NAD Psych: Normal affect. Neuro: Alert and oriented X 3. Moves all extremities spontaneously. HEENT: Normal  Neck: Supple without bruits or JVD. Lungs:  Resp regular and unlabored, bibasilar crackles. Heart: RRR no s3, s4, 2/6 syst murmur throughout chest- ? Radiated AVF bruit. Abdomen:  Soft, non-tender, non-distended, BS + x 4.  Extremities: No clubbing, cyanosis, 1+ bilat LE to edema to knees. DP/PT/Radials 1+ and equal bilaterally.  Accessory Clinical Findings  ECG - RSR, 98, left axis, ivcd, nonspecific st/t changes - no acute changes. 01/31/14   Assessment & Plan  1.  Cardiomyopathy:   Cath non ischemic  Needs to run as dry as possible on dialysis given LE edema and high filling pressurs.  Not a candidate for abdominal surgery No CAD at cath F/U  Echo in 6 months   2.  ESRD:  MWF dialysis. Continue midodrine for BP support Makes Rx of low EF inpossible   3.  Orthostatic hypotn w/ h/o syncope:  On midodrine.  4.  Dispo:  See me 6 months with echo  Jenkins Rouge, NP 02/14/2014, 2:43 PM

## 2014-04-10 ENCOUNTER — Telehealth (HOSPITAL_COMMUNITY): Payer: Self-pay | Admitting: Vascular Surgery

## 2014-04-10 NOTE — Telephone Encounter (Signed)
Left message

## 2014-04-12 ENCOUNTER — Other Ambulatory Visit: Payer: Self-pay | Admitting: General Surgery

## 2014-04-12 ENCOUNTER — Other Ambulatory Visit: Payer: Self-pay

## 2014-04-12 DIAGNOSIS — N186 End stage renal disease: Secondary | ICD-10-CM

## 2014-04-12 NOTE — Addendum Note (Signed)
Addended by: Stark Klein on: 04/12/2014 01:35 PM   Modules accepted: Orders

## 2014-04-12 NOTE — Addendum Note (Signed)
Addended by: Stark Klein on: 04/12/2014 12:42 PM   Modules accepted: Orders

## 2014-05-01 ENCOUNTER — Telehealth (HOSPITAL_COMMUNITY): Payer: Self-pay | Admitting: Vascular Surgery

## 2014-05-02 ENCOUNTER — Telehealth (HOSPITAL_COMMUNITY): Payer: Self-pay | Admitting: Vascular Surgery

## 2014-05-06 ENCOUNTER — Telehealth (HOSPITAL_COMMUNITY): Payer: Self-pay | Admitting: Vascular Surgery

## 2014-05-06 NOTE — Telephone Encounter (Signed)
Called pt several times to make new pt appointment, left messages no return call.Marland Kitchen kas

## 2014-05-14 NOTE — Op Note (Signed)
PATIENT NAME:  [name], GIPE MR#:  [id number] DATE OF BIRTH:  May 11, 1950  DATE OF PROCEDURE:  11/11/2011  PREOPERATIVE DIAGNOSES:  1. End-stage renal disease.  2. Poorly functioning right arm AV fistula.   POSTOPERATIVE DIAGNOSES:  1. End-stage renal disease.  2. Poorly functioning right arm AV fistula.   PROCEDURES PERFORMED: 1. Ultrasound guidance for vascular access to right radiocephalic AV fistula.  2. Right upper extremity fistulogram and central venogram.  3. Percutaneous transluminal angioplasty of perianastomotic stenosis with 6 mm diameter conventional and high-pressure angioplasty balloon.   SURGEON: Algernon Huxley, M.D.   ANESTHESIA: Local with moderate conscious sedation.   ESTIMATED BLOOD LOSS: 20 to 25 mL.   INDICATION FOR PROCEDURE: This is a 64 year old African American male with end-stage renal disease. His flow is decreased on his right arm AV fistula and we are asked to evaluate this.   DESCRIPTION OF PROCEDURE: The patient was brought to the vascular interventional radiology suite. The right upper extremity was sterilely prepped and draped and a sterile surgical field was created. The fistula was initially accessed in an antegrade fashion approximately 5 to 6 cm beyond the anastomosis. Imaging performed which showed the cephalic vein in the forearm to be patent. The drainage was through the deep venous system in the upper arm and the central veins were patent. With compression of the fistula, arterial evaluation was performed and this revealed an approximately 85% stenosis in the vein just beyond the anastomosis. I then removed the micropuncture sheath that I had placed initially, accessed in a retrograde fashion in the mid forearm with a micropuncture needle and under direct ultrasound guidance a permanent image was recorded. A 6 French sheath was placed across the lesion without difficulty with a Magic torque wire and a Kumpe catheter and treated the lesion initially  with a 6 mm diameter conventional angioplasty balloon but this would not alleviate the stenosis. A high-pressure angioplasty balloon was inflated to 28 atmospheres and after this there was significantly improved flow with minimal residual stenosis after angioplasty. At this point, I elected to terminate the procedure. The sheath was removed around a 4-0 Monocryl pursestring suture, pressure was held, and a sterile dressing was placed. The patient tolerated the procedure well and was taken to the recovery room in stable condition. ____________________________ Algernon Huxley, MD jsd:slb D: 11/11/2011 16:36:01 ET T: 11/11/2011 17:03:28 ET JOB#: [id number]  cc: Algernon Huxley, MD, <Dictator> Donato Heinz, MD Algernon Huxley MD ELECTRONICALLY SIGNED 11/15/2011 13:10

## 2014-05-17 NOTE — Op Note (Signed)
PATIENT NAME:  [name], [name] MR#:  [id number] DATE OF BIRTH:  1950/08/29  DATE OF PROCEDURE:  05/11/2012  PREOPERATIVE DIAGNOSES:  1. End-stage renal disease.  2. Decreased flow, right arm arteriovenous fistula.   POSTOPERATIVE DIAGNOSES:  1. End-stage renal disease.  2. Decreased flow, right arm arteriovenous fistula.   PROCEDURE: 1. Ultrasound guidance for vascular access, right radiocephalic arteriovenous fistula.  2. Right upper extremity fistulogram and central venogram.  3. Percutaneous transluminal angioplasty of peri-anastomotic stenosis with 6 and 7 mm diameter angioplasty balloon.   SURGEON: Algernon Huxley, MD   ANESTHESIA: Local with monitored conscious sedation.   BLOOD LOSS: Minimal.   INDICATION FOR PROCEDURE: This is a 64 year old African-American male well known to me for his dialysis access needs. He has had diminishing flow of his right arm AV fistula recently. We are asked to evaluate this. Risks and benefits were discussed. Informed consent was obtained.   DESCRIPTION OF PROCEDURE: The patient was brought to the vascular interventional radiology suite. The right upper extremity was sterilely prepped and draped, and a sterile surgical field was created. The fistula was accessed in a retrograde fashion just below the antecubital fossa in the cephalic vein. This was done under ultrasound guidance without difficulty, and a permanent image was recorded. A 6 French sheath was placed, and the patient was given 3000 units of intravenous heparin. A Kumpe catheter was placed at the anastomosis, and a 70% to 80% stenosis in the cephalic vein just beyond the anastomosis in the peri-anastomotic region was seen. The remainder of the fistula was patent. The majority of the outflow in the upper arm was in the basilic vein, and the central veins were patent. I crossed the lesion without difficulty with a Magic Torque wire. I treated it initially with a 6 mm diameter angioplasty balloon  with suboptimal result, and then a 7 mm diameter angioplasty balloon was inflated. Following this, there was significantly improved flow with minimal residual stenosis in the 10% to 15% range, and I elected to terminate the procedure. The sheath was removed around a 4-0 Monocryl pursestring suture. Pressure was held. Sterile dressing was placed. The patient tolerated the procedure well and was taken to the recovery room in stable condition.   ____________________________ Algernon Huxley, MD jsd:OSi D: 05/11/2012 11:23:28 ET T: 05/11/2012 11:37:18 ET JOB#: [id number]  cc: Algernon Huxley, MD, <Dictator> Algernon Huxley MD ELECTRONICALLY SIGNED 05/11/2012 15:31

## 2014-05-18 NOTE — Op Note (Signed)
PATIENT NAME:  [name], [name] MR#:  [id number] DATE OF BIRTH:  1950/06/21  DATE OF PROCEDURE:  05/17/2013  PREOPERATIVE DIAGNOSES: 1.  End-stage renal disease.  2.  Poorly functioning right arm arteriovenous fistula.   POSTOPERATIVE DIAGNOSES: 1.  End-stage renal disease.  2.  Poorly functioning right arm arteriovenous fistula.   PROCEDURES PERFORMED: 1.  Ultrasound guidance for vascular access to right radiocephalic AV fistula.  2.  Right upper extremity fistulogram and central venogram.  3. Percutaneous transluminal angioplasty of perianastomotic stenosis with 5 mm and 6 mm diameter angioplasty balloon.   SURGEON: Algernon Huxley, M.D.   ANESTHESIA: Local with moderate conscious sedation.   ESTIMATED BLOOD LOSS: Minimal.   INDICATION FOR PROCEDURE: A 64 year old [place] American male with end-stage renal disease, well known to our service. He is brought in today for diminished flows in his right arm AV fistula and fistulogram is being performed. Risks and benefits were discussed. Informed consent was obtained.   DESCRIPTION OF PROCEDURE: The patient is brought to the vascular suite. Right upper extremity was sterilely prepped and draped and a sterile surgical field was created. The fistula was accessed in a retrograde fashion. Under direct ultrasound guidance in the proximal forearm, a micropuncture wire and sheath were then placed and we upsized to a 6-French sheath. Imaging was then performed with a Kumpe catheter parked at the arterial anastomosis. There appeared to be a moderate narrowing from intimal hyperplasia at the perianastomotic region in the 50% to 60% range. The fistula in the remainder of the forearm was patent. The upper arm drainage was through the deep venous system and collaterals. He has had multiple previous grafts and fistulas upstream. The central venous circulation appeared to be patent. A Magic Torque wire was then placed. I treated the lesion initially with a 5 and  then with a 6 mm diameter angioplasty balloon. Following this, there was improvement in flow, and the residual  stenosis appeared to be in the 20% to 30% range. At this point, I elected to terminate the procedure. The sheath was removed, 4-0 Monocryl pursestring suture was placed. Pressure was held. Sterile dressing was placed. The patient tolerated the procedure well and was taken to the recovery room in stable condition.      ____________________________ Algernon Huxley, MD jsd:dmm D: 05/17/2013 11:14:24 ET T: 05/17/2013 11:27:34 ET JOB#: [id number]  cc: Algernon Huxley, MD, <Dictator> Algernon Huxley MD ELECTRONICALLY SIGNED 05/21/2013 9:36

## 2014-05-18 NOTE — Op Note (Signed)
PATIENT NAME:  [name], [name] MR#:  [id number] DATE OF BIRTH:  10-Feb-1950  DATE OF PROCEDURE:  11/01/2013  PREOPERATIVE DIAGNOSES: 1.  End-stage renal disease.  2.  Thrombosis in right arm arteriovenous fistula.   POSTOPERATIVE DIAGNOSES:  1.  End-stage renal disease.  2.  Thrombosis in right arm arteriovenous fistula.   PROCEDURES: 1.  Orson guidance for vascular access to right radiocephalic arteriovenous fistula.  2.  Right upper extremity fistulogram and central venogram.  3.  Catheter-directed thrombolysis with 4 mg of t-PA delivered to the forearm cephalic vein.  4.  Mechanical rheolytic thrombectomy to the forearm cephalic vein.  5.  Percutaneous transluminal angioplasty with 10 mm diameter angioplasty balloon to the forearm cephalic vein.   SURGEON: Algernon Huxley, MD.   ANESTHESIA: Local with moderate conscious sedation.   ESTIMATED BLOOD LOSS: Minimal.   CONTRAST USED: 35 mL Visipaque.   FLUOROSCOPY TIME: Less than 5 minutes.   INDICATION FOR PROCEDURE: This is a 64 year old gentleman well-known to [place] for his dialysis access needs. He was sent from his dialysis center where yesterday they were unable to run him due to thrombosis of his fistula. We are attempting to salvage this today. Risks and benefits were discussed. Informed consent was obtained.   DESCRIPTION OF PROCEDURE: The patient was brought to the vascular suite. The right upper extremity was sterilely prepped and draped and a sterile surgical field was created. The thrombosed area on ultrasound appeared to be the aneurysmal segment from the arterial access site. I accessed the radiocephalic fistula just a couple centimeters beyond the anastomosis further down in the arm from this access site. This was done under direct ultrasound guidance with a micropuncture needle. A micropuncture wire and sheath were placed and a permanent image was recorded. Upsized to a 6 French sheath and imaging was performed showing the  aneurysmal segment with significant thrombus present. I crossed this area without difficulty with a Magic torque wire. Ran the  AngioJet AVX catheter with 4 mg of tPA instilled in a power pulse spray fashion in the forearm cephalic vein. This was allowed to dwell for about 10 to 15 minutes. Mechanical rheolytic thrombectomy was then performed with the AngioJet AVX catheter with pressure applied to the fistula. Imaging was performed to opacify the anastomosis which appeared patent. The outflow in the upper arm was dual through the cephalic vein and the basilic vein, and the central venous circulation was patent. Following this, I ballooned the area with a 10 mm diameter high pressure angioplasty balloon. Completion imaging showed that there was restoration of flow within the AV fistula. The aneurysmal segment certainly still had a significant amount of mural thrombus, but the lumen was now dilated up to at least 10 mm and had good flow through the fistula on imaging and it was palpable clinically. At this point, I elected to terminate the procedure. The sheath was removed, 4-0 Monocryl pursestring suture was placed. Pressure was held. Sterile dressing was placed. The patient tolerated the procedure well and was taken to the recovery room in stable condition.    ____________________________ Algernon Huxley, MD jsd:at D: 11/01/2013 10:43:04 ET T: 11/01/2013 13:15:56 ET JOB#: [id number]  cc: Algernon Huxley, MD, <Dictator> Algernon Huxley MD ELECTRONICALLY SIGNED 11/10/2013 12:33

## 2014-05-18 NOTE — Op Note (Signed)
PATIENT NAME:  [name], [name] MR#:  [id number] DATE OF BIRTH:  Sep 15, 1950  DATE OF PROCEDURE:  10/19/2013  PREOPERATIVE DIAGNOSES:  1. Thrombosis of right forearm radiocephalic fistula.  2. End-stage renal disease requiring hemodialysis.   POSTOPERATIVE DIAGNOSIS:  1. Stricture of  right radiocephalic fistula at the venous outflow portion with mobile thrombus, but the fistula itself is not thrombosed.  2. End-stage renal disease requiring hemodialysis.   PROCEDURES PERFORMED:  1. Contrast injection, right arm radiocephalic fistula.  2. Percutaneous transluminal angioplasty, right arm radiocephalic fistula at the venous outflow. 3. Placement of a self-expanding stent, LifeStar variety, for treatment of residual stricture and mobile thrombus within the venous outflow of the right radiocephalic fistula.   SURGEON: Katha Cabal, M.D.   SEDATION:  Versed 3 mg plus fentanyl 100 mcg administered IV. Continuous ECG, pulse oximetry and cardiopulmonary monitoring was performed throughout the entire procedure by the interventional radiology nurse. Total sedation time is 1 hour.   ACCESS: A 6 French sheath, antegrade direction, right radiocephalic fistula.   FLUOROSCOPY TIME: 4.1 minutes.   CONTRAST USED: Isovue 70 mL.   INDICATIONS: [name] is a 64 year old gentleman sent in from dialysis because they were unable to cannulate his fistula. Risks and benefits for possible thrombectomy and intervention were reviewed. All questions answered. The patient agrees to proceed.   DESCRIPTION OF PROCEDURE: The patient is taken to special procedures and placed in the supine position. After adequate sedation is achieved, he is positioned supine and his right arm is extended palm upward. The right arm is prepped and draped in a sterile fashion. Appropriate timeout is called.   Lidocaine 1%  is infiltrated in the soft tissue. Ultrasound is placed in a sterile sleeve. The radiocephalic fistula is  noted and followed down to the anastomosis which appears patent. The fistula is echolucent and compressible indicating patency. Under ultrasound guidance after images recorded, microneedle is inserted into the fistula. Microwire followed by microsheath. J-wire followed by a 6 French sheath. Hand injection of contrast is then used to demonstrate the fistula as well as the outflow continuing through the central veins. On identifying review of these images, there is an area of stenosis just proximal to the second aneurysmal area and this is associated with a mobile thrombus that is flapping or moving with the cardiac cycle. Heparin 3000 units  is given and Magic torque wire is advanced through the area. A 10 x 4 balloon is inflated across this lesion. After 2 full minutes at 10 atmospheres there is significant improvement in the narrowing, but the mobile thrombus remains easily visualized. I therefore elected to stent this area as a piece of thrombotic material or an embolism from this location would likely impact at the bifurcation of the veins noted in the antecubital fossa and given the size of the mobile thrombus compared to the size of the vein at this level, it would provide an occlusive lesion destroying the fistula. Therefore, a 14 x 40 LifeStar stent was advanced across this deployed. Follow-up imaging demonstrates that it has expanded quite nicely and that the thrombotic material is now very difficult to observe and is certainly no longer mobile. Given the large expansion I did not feel that balloon dilation post stenting is warranted as I believe this would just cause the material to be shredded by the stent and then embolized distally.   Pursestring suture of 4-0 Monocryl was placed around the sheath after the wire was removed and the sheath was  removed and there were no immediate complications. It should be noted, prior to the intervention using manual compression I refluxed contrast and demonstrates the  arterial anastomosis and visualized portions of the radial artery were widely patent.   The patient tolerated the procedure well and there were no immediate complications.   INTERPRETATION: Initial views of the fistula demonstrate that a radiocephalic fistula is indeed patent and there are 2 large aneurysmal areas that do not appear to have large thrombus burdens; however, at just proximal to the second area there is a stricture that is associated with mobile thrombus. More proximally in the forearm the radial scar the cephalic vein divides in the antecubital fossa. It then continues with the dominant outflow being the crossing vein into the basilic. The basilic, axillary, subclavian, innominate and superior vena cava are all widely patent. The brachial veins are discontinuous but do reform at the level of the axillary.   Following angioplasty, the stricture is improved but the mobile thrombus is still present and therefore a stent is placed and this eliminates the thrombus as a potential embolic source.   SUMMARY: Successful treatment of the right radiocephalic fistula as described.    ____________________________ Katha Cabal, MD ggs:JT D: 10/19/2013 20:14:38 ET T: 10/20/2013 02:16:49 ET JOB#: [id number]  cc: Katha Cabal, MD, <Dictator> Katha Cabal MD ELECTRONICALLY SIGNED 11/12/2013 20:45

## 2014-05-19 NOTE — Op Note (Signed)
PATIENT NAME:  [name], [name] MR#:  [id number] DATE OF BIRTH:  10-13-1950  DATE OF PROCEDURE:  06/22/2011  PREOPERATIVE DIAGNOSIS: Complication AV dialysis device with poor function of right forearm fistula.   POSTOPERATIVE DIAGNOSIS: Complication AV dialysis device with poor function of right forearm fistula.   PROCEDURES PERFORMED:  1. Contrast injection of right forearm fistula.  2. Percutaneous transluminal angioplasty to 6 mm via arterial anastomosis right forearm fistula.   SURGEON: Hortencia Pilar, M.D.   SEDATION: Versed 2 mg plus fentanyl, 100 mcg administered IV. Continuous ECG, pulse oximetry and cardiopulmonary monitoring was performed throughout the entire procedure by the interventional radiology nurse. Total sedation time was 45 minutes.   ACCESS: 6 French sheath, retrograde direction, right forearm fistula.   CONTRAST USED: Isovue 25 mL.   FLUOROSCOPY TIME: 0.7 minutes.   INDICATIONS: Mr. Coiner is a 64 year old gentleman who has recently been having increasing problems with accessing his fistula and decreasing efficiency of his dialysis runs. Physical examination as well as noninvasive studies demonstrated high-grade stricture stenosis at the arterial anastomosis. He is undergoing angiography with the intention for treatment. The risks and benefits were reviewed, all questions answered, and the patient agrees to proceed.   DESCRIPTION OF PROCEDURE: The patient is taken to special procedures and placed in the supine position. After adequate sedation is achieved, the right arm is extended palm upward and prepped and draped in sterile fashion. 1% lidocaine is infiltrated in the soft tissues and access to the fistula is obtained with a micropuncture needle in a retrograde direction, microwire followed by microsheath, J-wire followed by a 6 French sheath. KMP catheter with the J-wire is then advanced through the arterial anastomosis and positioned with its tip in the radial  artery. Hand injection of contrast is then utilized to demonstrate the anatomy of the fistula. High-grade stenosis is noted at the arterial anastomosis and 3000 units of heparin is given.   A Magic torque wire is then advanced through the Kumpe catheter and a 5 x 2 balloon is inflated across the lesion. Follow-up angiography demonstrates improvement, but it still remains undersized and a 6 x 4 balloon is then advanced across this lesion and inflated to 10 atmospheres for one minute. Follow-up angiography demonstrates significant improvement with minimal residual stenosis. The remaining venous outflow of the fistula is then evaluated including the central veins. A pursestring suture of 4-0 Monocryl is placed and there are no immediate complications.   INTERPRETATION: The fistula is imaged and demonstrates a high-grade greater than 80% stenosis at the level of the arterial anastomosis. Following angioplasty to 5 mm, there is improvement but it remains under size and following angioplasty to 6 mm there is significant improvement with minimal residual stenosis. The remaining portions of the fistula are widely patent. In the upper extremity, there are extensive venous outflow patterns. The axillary vein appears to be occluded for a short segment. Previously placed stents from a brachial axillary dialysis graft are noted. However, there is extensive collateral formation around this area and the central veins are otherwise widely patent.   SUMMARY: Successful angioplasty at the arterial anastomosis of the right arm AV fistula.  ____________________________ Katha Cabal, MD ggs:slb D: 06/22/2011 09:47:31 ET T: 06/22/2011 11:16:03 ET JOB#: [id number]  cc: Katha Cabal, MD, <Dictator> Katha Cabal MD ELECTRONICALLY SIGNED 06/25/2011 9:00

## 2014-07-04 NOTE — Telephone Encounter (Signed)
Open in error

## 2014-07-26 ENCOUNTER — Other Ambulatory Visit: Payer: Self-pay

## 2014-08-26 ENCOUNTER — Other Ambulatory Visit: Payer: Self-pay | Admitting: Nephrology

## 2014-08-26 ENCOUNTER — Ambulatory Visit
Admission: RE | Admit: 2014-08-26 | Discharge: 2014-08-26 | Disposition: A | Discharge status: Home / Self Care | Payer: Medicare Other | Source: Ambulatory Visit | Attending: Nephrology | Admitting: Nephrology

## 2014-08-26 DIAGNOSIS — S0990XA Unspecified injury of head, initial encounter: Secondary | ICD-10-CM

## 2014-08-27 ENCOUNTER — Encounter
Admission: RE | Admit: 2014-08-27 | Discharge: 2014-08-27 | Disposition: A | Discharge status: Home / Self Care | Payer: Medicare Other | Source: Ambulatory Visit | Attending: Vascular Surgery | Admitting: Vascular Surgery

## 2014-08-27 DIAGNOSIS — Z01812 Encounter for preprocedural laboratory examination: Secondary | ICD-10-CM | POA: Insufficient documentation

## 2014-08-27 DIAGNOSIS — Z01810 Encounter for preprocedural cardiovascular examination: Secondary | ICD-10-CM | POA: Insufficient documentation

## 2014-08-27 LAB — BASIC METABOLIC PANEL
ANION GAP: 13 (ref 5–15)
BUN: 34 mg/dL — ABNORMAL HIGH (ref 6–20)
CHLORIDE: 97 mmol/L — AB (ref 101–111)
CO2: 32 mmol/L (ref 22–32)
Calcium: 9.4 mg/dL (ref 8.9–10.3)
Creatinine, Ser: 6.19 mg/dL — ABNORMAL HIGH (ref 0.61–1.24)
GFR calc non Af Amer: 9 mL/min — ABNORMAL LOW (ref >60)
GFR, EST AFRICAN AMERICAN: 10 mL/min — AB (ref >60)
Glucose, Bld: 104 mg/dL — ABNORMAL HIGH (ref 65–99)
POTASSIUM: 3.3 mmol/L — AB (ref 3.5–5.1)
Sodium: 142 mmol/L (ref 135–145)

## 2014-08-27 LAB — TYPE AND SCREEN
ABO/RH(D): A POS
Antibody Screen: NEGATIVE

## 2014-08-27 LAB — PROTIME-INR
INR: 1.2
Prothrombin Time: 15.4 seconds — ABNORMAL HIGH (ref 11.4–15.0)

## 2014-08-27 LAB — CBC
HEMATOCRIT: 33.5 % — AB (ref 40.0–52.0)
HEMOGLOBIN: 11.3 g/dL — AB (ref 13.0–18.0)
MCH: 33.4 pg (ref 26.0–34.0)
MCHC: 33.9 g/dL (ref 32.0–36.0)
MCV: 98.7 fL (ref 80.0–100.0)
Platelets: 137 10*3/uL — ABNORMAL LOW (ref 150–440)
RBC: 3.39 MIL/uL — ABNORMAL LOW (ref 4.40–5.90)
RDW: 14.7 % — ABNORMAL HIGH (ref 11.5–14.5)
WBC: 4.4 10*3/uL (ref 3.8–10.6)

## 2014-08-27 LAB — APTT: aPTT: 33 seconds (ref 24–36)

## 2014-08-27 LAB — ABO/RH: ABO/RH(D): A POS

## 2014-08-27 NOTE — Patient Instructions (Signed)
Your procedure is scheduled QA:STMHDQ 11, 2016 (Thursday) Report to Day Surgery. To find out your arrival time please call [phone] between 1PM - 3PM on September 04 2014 (Wednesday)  Remember: Instructions that are not followed completely may result in serious medical risk, up to and including death, or upon the discretion of your surgeon and anesthesiologist your surgery may need to be rescheduled.    _x__ 1. Do not eat food or drink liquids after midnight. No gum chewing or hard candies.     ____ 2. No Alcohol for 24 hours before or after surgery.   ____ 3. Bring all medications with you on the day of surgery if instructed.    __x__ 4. Notify your doctor if there is any change in your medical condition     (cold, fever, infections).     Do not wear jewelry, make-up, hairpins, clips or nail polish.  Do not wear lotions, powders, or perfumes. You may wear deodorant.  Do not shave 48 hours prior to surgery. Men may shave face and neck.  Do not bring valuables to the hospital.    [facility] is not responsible for any belongings or valuables.               Contacts, dentures or bridgework may not be worn into surgery.  Leave your suitcase in the car. After surgery it may be brought to your room.  For patients admitted to the hospital, discharge time is determined by your                treatment team.   Patients discharged the day of surgery will not be allowed to drive home.   Please read over the following fact sheets that you were given:   Surgical Site Infection Prevention   ____ Take these medicines the morning of surgery with A SIP OF WATER:    1. Midodrine  2.   3.   4.  5.  6.  ____ Fleet Enema (as directed)   __x__ Use CHG Soap as directed(SAGE WIPES) ____ Use inhalers on the day of surgery  ____ Stop metformin 2 days prior to surgery    ____ Take 1/2 of usual insulin dose the night before surgery and none on the morning of surgery.   ____ Stop  Coumadin/Plavix/aspirin on (DO NOT TAKE ASPIRIN THE MORNING OF SURGERY)  ____ Stop Anti-inflammatories on    ____ Stop supplements until after surgery.    ____ Bring C-Pap to the hospital.   Your procedure is scheduled on: September 04, 2104 Report to Day Surgery. To find out your arrival time please call [phone] between 1PM - 3PM on September 04 2014  Remember: Instructions that are not followed completely may result in serious medical risk, up to and including death, or upon the discretion of your surgeon and anesthesiologist your surgery may need to be rescheduled.    ____ 1. Do not eat food or drink liquids after midnight. No gum chewing or hard candies.     ____ 2. No Alcohol for 24 hours before or after surgery.   ____ 3. Bring all medications with you on the day of surgery if instructed.    ____ 4. Notify your doctor if there is any change in your medical condition     (cold, fever, infections).     Do not wear jewelry, make-up, hairpins, clips or nail polish.  Do not wear lotions, powders, or perfumes. You may wear deodorant.  Do  not shave 48 hours prior to surgery. Men may shave face and neck.  Do not bring valuables to the hospital.    [facility] is not responsible for any belongings or valuables.               Contacts, dentures or bridgework may not be worn into surgery.  Leave your suitcase in the car. After surgery it may be brought to your room.  For patients admitted to the hospital, discharge time is determined by your                treatment team.   Patients discharged the day of surgery will not be allowed to drive home.   Please read over the following fact sheets that you were given:   Surgical Site Infection Prevention   _X__ Take these medicines the morning of surgery with A SIP OF WATER:    1Midodrine  2.   3.   4.  5.  6.  ____ Fleet Enema (as directed)   _X__ Use CHG Soap as directed (SAGE WIPES)  ____ Use inhalers on the day of  surgery  ____ Stop metformin 2 days prior to surgery    ____ Take 1/2 of usual insulin dose the night before surgery and none on the morning of surgery.   _X___ Stop Coumadin/Plavix/aspirin on (DO NOT TAKE ASPIRIN MORNING OF SURGERY)  ____ Stop Anti-inflammatories on    ____ Stop supplements until after surgery.    ____ Bring C-Pap to the hospital.

## 2014-08-28 NOTE — OR Nursing (Signed)
Request from Dr Ronelle Nigh for clearance faxed to pcp and called to Dr Lucky Cowboy

## 2014-08-29 ENCOUNTER — Ambulatory Visit: Admission: RE | Admit: 2014-08-29 | Payer: Medicare Other | Source: Ambulatory Visit | Admitting: Vascular Surgery

## 2014-08-29 ENCOUNTER — Encounter: Admission: RE | Payer: Self-pay | Source: Ambulatory Visit

## 2014-08-29 SURGERY — DIALYSIS/PERMA CATHETER INSERTION
Anesthesia: Moderate Sedation

## 2014-09-03 NOTE — OR Nursing (Signed)
DR Erling Cruz WOULD NOT CLEAR. FAXED AND CALLED TO DR Lucky Cowboy OFFICE NEEDS CARDIAC CLEARANCE

## 2014-09-05 ENCOUNTER — Inpatient Hospital Stay
Admission: RE | Admit: 2014-09-05 | Discharge: 2014-09-12 | DRG: 189 | Disposition: A | Discharge status: Skilled Nursing Facility | Payer: Medicare Other | Source: Ambulatory Visit | Attending: Internal Medicine | Admitting: Internal Medicine

## 2014-09-05 ENCOUNTER — Inpatient Hospital Stay: Payer: Medicare Other

## 2014-09-05 ENCOUNTER — Encounter
Admission: RE | Disposition: A | Discharge status: Skilled Nursing Facility | Payer: Self-pay | Source: Ambulatory Visit | Attending: Internal Medicine

## 2014-09-05 ENCOUNTER — Encounter: Payer: Self-pay | Admitting: *Deleted

## 2014-09-05 ENCOUNTER — Other Ambulatory Visit: Payer: Medicare Other

## 2014-09-05 DIAGNOSIS — I429 Cardiomyopathy, unspecified: Secondary | ICD-10-CM | POA: Diagnosis present

## 2014-09-05 DIAGNOSIS — E8770 Fluid overload, unspecified: Secondary | ICD-10-CM | POA: Diagnosis not present

## 2014-09-05 DIAGNOSIS — T82898A Other specified complication of vascular prosthetic devices, implants and grafts, initial encounter: Secondary | ICD-10-CM | POA: Diagnosis present

## 2014-09-05 DIAGNOSIS — I9589 Other hypotension: Secondary | ICD-10-CM | POA: Diagnosis present

## 2014-09-05 DIAGNOSIS — J9601 Acute respiratory failure with hypoxia: Principal | ICD-10-CM

## 2014-09-05 DIAGNOSIS — N2581 Secondary hyperparathyroidism of renal origin: Secondary | ICD-10-CM | POA: Diagnosis present

## 2014-09-05 DIAGNOSIS — R609 Edema, unspecified: Secondary | ICD-10-CM

## 2014-09-05 DIAGNOSIS — Z87891 Personal history of nicotine dependence: Secondary | ICD-10-CM | POA: Diagnosis not present

## 2014-09-05 DIAGNOSIS — J449 Chronic obstructive pulmonary disease, unspecified: Secondary | ICD-10-CM | POA: Diagnosis present

## 2014-09-05 DIAGNOSIS — Z7982 Long term (current) use of aspirin: Secondary | ICD-10-CM | POA: Diagnosis not present

## 2014-09-05 DIAGNOSIS — D631 Anemia in chronic kidney disease: Secondary | ICD-10-CM | POA: Diagnosis present

## 2014-09-05 DIAGNOSIS — I5023 Acute on chronic systolic (congestive) heart failure: Secondary | ICD-10-CM | POA: Diagnosis present

## 2014-09-05 DIAGNOSIS — I2510 Atherosclerotic heart disease of native coronary artery without angina pectoris: Secondary | ICD-10-CM | POA: Diagnosis present

## 2014-09-05 DIAGNOSIS — I739 Peripheral vascular disease, unspecified: Secondary | ICD-10-CM | POA: Diagnosis present

## 2014-09-05 DIAGNOSIS — Y832 Surgical operation with anastomosis, bypass or graft as the cause of abnormal reaction of the patient, or of later complication, without mention of misadventure at the time of the procedure: Secondary | ICD-10-CM | POA: Diagnosis present

## 2014-09-05 DIAGNOSIS — I120 Hypertensive chronic kidney disease with stage 5 chronic kidney disease or end stage renal disease: Secondary | ICD-10-CM | POA: Diagnosis present

## 2014-09-05 DIAGNOSIS — G4730 Sleep apnea, unspecified: Secondary | ICD-10-CM | POA: Diagnosis present

## 2014-09-05 DIAGNOSIS — I959 Hypotension, unspecified: Secondary | ICD-10-CM | POA: Diagnosis not present

## 2014-09-05 DIAGNOSIS — I340 Nonrheumatic mitral (valve) insufficiency: Secondary | ICD-10-CM | POA: Diagnosis not present

## 2014-09-05 DIAGNOSIS — Z8249 Family history of ischemic heart disease and other diseases of the circulatory system: Secondary | ICD-10-CM

## 2014-09-05 DIAGNOSIS — E440 Moderate protein-calorie malnutrition: Secondary | ICD-10-CM | POA: Diagnosis present

## 2014-09-05 DIAGNOSIS — K219 Gastro-esophageal reflux disease without esophagitis: Secondary | ICD-10-CM | POA: Diagnosis present

## 2014-09-05 DIAGNOSIS — I5022 Chronic systolic (congestive) heart failure: Secondary | ICD-10-CM | POA: Diagnosis not present

## 2014-09-05 DIAGNOSIS — I350 Nonrheumatic aortic (valve) stenosis: Secondary | ICD-10-CM | POA: Diagnosis present

## 2014-09-05 DIAGNOSIS — N186 End stage renal disease: Secondary | ICD-10-CM | POA: Diagnosis present

## 2014-09-05 DIAGNOSIS — I5021 Acute systolic (congestive) heart failure: Secondary | ICD-10-CM | POA: Insufficient documentation

## 2014-09-05 DIAGNOSIS — I272 Other secondary pulmonary hypertension: Secondary | ICD-10-CM | POA: Diagnosis present

## 2014-09-05 DIAGNOSIS — R05 Cough: Secondary | ICD-10-CM

## 2014-09-05 DIAGNOSIS — I252 Old myocardial infarction: Secondary | ICD-10-CM | POA: Diagnosis not present

## 2014-09-05 DIAGNOSIS — Z992 Dependence on renal dialysis: Secondary | ICD-10-CM | POA: Insufficient documentation

## 2014-09-05 DIAGNOSIS — R053 Chronic cough: Secondary | ICD-10-CM

## 2014-09-05 HISTORY — DX: Atherosclerotic heart disease of native coronary artery without angina pectoris: I25.10

## 2014-09-05 HISTORY — PX: PERIPHERAL VASCULAR CATHETERIZATION: SHX172C

## 2014-09-05 HISTORY — DX: Hypotension, unspecified: I95.9

## 2014-09-05 LAB — CBC
HCT: 35.1 % — ABNORMAL LOW (ref 40.0–52.0)
Hemoglobin: 11.3 g/dL — ABNORMAL LOW (ref 13.0–18.0)
MCH: 30.6 pg (ref 26.0–34.0)
MCHC: 32.3 g/dL (ref 32.0–36.0)
MCV: 94.8 fL (ref 80.0–100.0)
Platelets: 150 10*3/uL (ref 150–440)
RBC: 3.7 MIL/uL — ABNORMAL LOW (ref 4.40–5.90)
RDW: 15 % — AB (ref 11.5–14.5)
WBC: 4.8 10*3/uL (ref 3.8–10.6)

## 2014-09-05 LAB — RENAL FUNCTION PANEL
Albumin: 3.4 g/dL — ABNORMAL LOW (ref 3.5–5.0)
Anion gap: 13 (ref 5–15)
BUN: 24 mg/dL — ABNORMAL HIGH (ref 6–20)
CHLORIDE: 99 mmol/L — AB (ref 101–111)
CO2: 31 mmol/L (ref 22–32)
CREATININE: 6.03 mg/dL — AB (ref 0.61–1.24)
Calcium: 9.1 mg/dL (ref 8.9–10.3)
GFR calc Af Amer: 10 mL/min — ABNORMAL LOW (ref >60)
GFR, EST NON AFRICAN AMERICAN: 9 mL/min — AB (ref >60)
Glucose, Bld: 91 mg/dL (ref 65–99)
Phosphorus: 4.8 mg/dL — ABNORMAL HIGH (ref 2.5–4.6)
Potassium: 3.2 mmol/L — ABNORMAL LOW (ref 3.5–5.1)
SODIUM: 143 mmol/L (ref 135–145)

## 2014-09-05 LAB — BASIC METABOLIC PANEL
Anion gap: 12 (ref 5–15)
BUN: 24 mg/dL — AB (ref 6–20)
CALCIUM: 9.1 mg/dL (ref 8.9–10.3)
CO2: 31 mmol/L (ref 22–32)
CREATININE: 6.2 mg/dL — AB (ref 0.61–1.24)
Chloride: 100 mmol/L — ABNORMAL LOW (ref 101–111)
GFR, EST AFRICAN AMERICAN: 10 mL/min — AB (ref >60)
GFR, EST NON AFRICAN AMERICAN: 9 mL/min — AB (ref >60)
GLUCOSE: 91 mg/dL (ref 65–99)
Potassium: 3.2 mmol/L — ABNORMAL LOW (ref 3.5–5.1)
Sodium: 143 mmol/L (ref 135–145)

## 2014-09-05 LAB — MAGNESIUM: Magnesium: 2.1 mg/dL (ref 1.7–2.4)

## 2014-09-05 LAB — MRSA PCR SCREENING: MRSA by PCR: NEGATIVE

## 2014-09-05 LAB — GLUCOSE, CAPILLARY: Glucose-Capillary: 83 mg/dL (ref 65–99)

## 2014-09-05 LAB — POTASSIUM (ARMC VASCULAR LAB ONLY): POTASSIUM (ARMC VASCULAR LAB): 3.5

## 2014-09-05 SURGERY — DIALYSIS/PERMA CATHETER INSERTION
Anesthesia: Moderate Sedation

## 2014-09-05 MED ORDER — IPRATROPIUM-ALBUTEROL 0.5-2.5 (3) MG/3ML IN SOLN
3.0000 mL | Freq: Once | RESPIRATORY_TRACT | Status: AC
  Administered 2014-09-05: 3 mL via RESPIRATORY_TRACT

## 2014-09-05 MED ORDER — HEPARIN (PORCINE) IN NACL 2-0.9 UNIT/ML-% IJ SOLN
INTRAMUSCULAR | Status: AC
  Filled 2014-09-05: qty 500

## 2014-09-05 MED ORDER — HEPARIN SODIUM (PORCINE) 5000 UNIT/ML IJ SOLN
5000.0000 [IU] | Freq: Three times a day (TID) | INTRAMUSCULAR | Status: DC
  Administered 2014-09-05 – 2014-09-12 (×21): 5000 [IU] via SUBCUTANEOUS
  Filled 2014-09-05 (×22): qty 1

## 2014-09-05 MED ORDER — CEFAZOLIN SODIUM 1-5 GM-% IV SOLN
1.0000 g | Freq: Once | INTRAVENOUS | Status: AC
  Administered 2014-09-05: 1 g via INTRAVENOUS

## 2014-09-05 MED ORDER — SODIUM CHLORIDE 0.9 % IV SOLN
INTRAVENOUS | Status: DC
  Administered 2014-09-05: 09:00:00 via INTRAVENOUS

## 2014-09-05 MED ORDER — SODIUM CHLORIDE 0.9 % IV SOLN
INTRAVENOUS | Status: DC
  Administered 2014-09-05: 11:00:00 via INTRAVENOUS

## 2014-09-05 MED ORDER — IOHEXOL 300 MG/ML  SOLN
INTRAMUSCULAR | Status: DC | PRN
  Administered 2014-09-05: 15 mL via INTRAVENOUS

## 2014-09-05 MED ORDER — HEPARIN SODIUM (PORCINE) 10000 UNIT/ML IJ SOLN
INTRAMUSCULAR | Status: AC
  Filled 2014-09-05: qty 1

## 2014-09-05 MED ORDER — HEPARIN SODIUM (PORCINE) 1000 UNIT/ML IJ SOLN
5000.0000 [IU] | Freq: Once | INTRAMUSCULAR | Status: AC
Start: 2014-09-05 — End: 2014-09-05
  Administered 2014-09-05: 5000 [IU]

## 2014-09-05 MED ORDER — ASPIRIN EC 81 MG PO TBEC
81.0000 mg | DELAYED_RELEASE_TABLET | Freq: Every day | ORAL | Status: DC
  Administered 2014-09-05 – 2014-09-12 (×8): 81 mg via ORAL
  Filled 2014-09-05 (×9): qty 1

## 2014-09-05 MED ORDER — IPRATROPIUM-ALBUTEROL 0.5-2.5 (3) MG/3ML IN SOLN
3.0000 mL | RESPIRATORY_TRACT | Status: DC
  Administered 2014-09-05 – 2014-09-07 (×12): 3 mL via RESPIRATORY_TRACT
  Filled 2014-09-05 (×13): qty 3

## 2014-09-05 MED ORDER — IPRATROPIUM-ALBUTEROL 0.5-2.5 (3) MG/3ML IN SOLN
RESPIRATORY_TRACT | Status: AC
  Administered 2014-09-05: 3 mL via RESPIRATORY_TRACT
  Filled 2014-09-05: qty 3

## 2014-09-05 MED ORDER — CINACALCET HCL 30 MG PO TABS
30.0000 mg | ORAL_TABLET | Freq: Every day | ORAL | Status: DC
  Administered 2014-09-06 – 2014-09-10 (×5): 30 mg via ORAL
  Filled 2014-09-05 (×5): qty 1

## 2014-09-05 MED ORDER — LIDOCAINE-EPINEPHRINE (PF) 1 %-1:200000 IJ SOLN
INTRAMUSCULAR | Status: AC
  Filled 2014-09-05: qty 30

## 2014-09-05 SURGICAL SUPPLY — 13 items
CATH KUMPE (CATHETERS) ×2
CATH PALINDROME-P 44CM KIT (CATHETERS) ×2
CATH SLIP 5FR 0.38 X 40 KMP (CATHETERS) ×1 IMPLANT
DERMABOND ADVANCED (GAUZE/BANDAGES/DRESSINGS) ×2
DERMABOND ADVANCED .7 DNX12 (GAUZE/BANDAGES/DRESSINGS) ×1 IMPLANT
GLIDEWIRE STIFF .35X180X3 HYDR (WIRE) ×3 IMPLANT
KIT 5FR STIFF NT/TG (MISCELLANEOUS) ×3 IMPLANT
KIT CATH 64X44X15FR RVRS (CATHETERS) ×1 IMPLANT
PACK ANGIOGRAPHY (CUSTOM PROCEDURE TRAY) ×3 IMPLANT
SHEATH BRITE TIP 5FRX11 (SHEATH) ×3 IMPLANT
SUT MNCRL AB 4-0 PS2 18 (SUTURE) ×3 IMPLANT
SUT PROLENE 0 CT 1 30 (SUTURE) ×3 IMPLANT
TOWEL OR 17X26 4PK STRL BLUE (TOWEL DISPOSABLE) ×3 IMPLANT

## 2014-09-05 NOTE — Progress Notes (Signed)
   09/05/14 1400  Clinical Encounter Type  Visited With Family  Visit Type Initial  Spiritual Encounters  Spiritual Needs Emotional  Stress Factors  Family Stress Factors Health changes;Lack of knowledge;Major life changes  Provided pastoral support, presence, compassion and active listening to patient's daughter in the ICU waiting room.  [place] [phone]

## 2014-09-05 NOTE — Consult Note (Signed)
Date: 09/05/2014                  Patient Name:  [name]  MRN: [id number]  DOB: April 08, 1950  Age / Sex: 64 y.o., male         PCP: Estanislado Emms, MD                 Service Requesting Consult: Internal medicine                 Reason for Consult: ESRD, pulmonary edema            History of Present Illness: Patient is a 64 y.o. male with medical problems of end-stage renal disease, congestive heart failure, on dialysis for approximately 20 years, who was admitted to [facility] on 09/05/2014 for PermCath placement.  Patient dialyzes in [place].  He does not know the name of his usual unit.  His last dialysis treatment was yesterday.  He is somewhat short of breath and his speech is difficult to understand.  Therefore, only limited medical history is available.  No family is present in the room at this time. He had a right femoral PermCath placed today.  After the procedure he is significantly short of breath and requiring supplemental oxygen with nonrebreather mask.  Nephrology consult has been requested for emergent dialysis treatment.   Medications: Outpatient medications: Prescriptions prior to admission  Medication Sig Dispense Refill Last Dose  . aspirin EC 81 MG tablet Take 81 mg by mouth daily.   09/04/2014 at Unknown time  . cinacalcet (SENSIPAR) 30 MG tablet Take 30 mg by mouth daily.     Marland Kitchen lanthanum (FOSRENOL) 500 MG chewable tablet Chew 1,000 mg by mouth 3 (three) times daily after meals.    09/04/2014 at Unknown time  . midodrine (PROAMATINE) 10 MG tablet Take 10 mg by mouth 3 (three) times daily.    09/04/2014 at Unknown time    Current medications: No current facility-administered medications for this encounter.      Allergies: No Known Allergies    Past Medical History: Past Medical History  Diagnosis Date  . ESRD (end stage renal disease) 1996    a. [facility] [place], M-W-F  . Anemia   . GERD (gastroesophageal reflux disease)   . Nonischemic  cardiomyopathy     a. dx 2005;  b. 04/2009 Echo: EF  >55%;  c. 12/2013 Echo: EF 20-25%, diff HK, inflat/infsept/inf AK. Mild AS, mod MR, sev dil LA, PASP 47 mmHg.  Marland Kitchen Perirectal abscess     a. s/p I & D 2013.  Marland Kitchen Secondary hyperparathyroidism   . Chronic obstructive pulmonary disease   . Retroperitoneal mass     a. 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  . Edema extremities     left arm  . Myocardial infarction   . Coronary artery disease      Past Surgical History: Past Surgical History  Procedure Laterality Date  . Insertion of dialysis catheter    . Av fistula placement      Numerous fistulas placed in both arms and left leg; using rt arm fistula presently  . Examination under anesthesia  03/18/2011    Procedure: EXAM UNDER ANESTHESIA;  Surgeon: [name], MD;  Location: [place];  Service: General;  Laterality: N/A;  exam under anesthesia, incision and drainage of perirectal abscess  . Parathyroidectomy    .  Left and right heart catheterization with coronary angiogram N/A 01/31/2014    Procedure: LEFT AND RIGHT HEART CATHETERIZATION WITH CORONARY ANGIOGRAM;  Surgeon: Blane Ohara, MD;  Location: [facility] CATH LAB;  Service: Cardiovascular;  Laterality: N/A;  . Cardiac catheterization       Family History: Family History  Problem Relation Age of Onset  . Heart failure Mother   . Heart failure Father   . Heart attack Brother      Social History: Social History   Social History  . Marital Status: Married    Spouse Name: N/A  . Number of Children: N/A  . Years of Education: N/A   Occupational History  . retired    Social History Main Topics  . Smoking status: Former Smoker -- 2.00 packs/day for 15 years    Types: Cigarettes    Quit date: 03/11/1983  . Smokeless tobacco: Never Used  . Alcohol Use: No  . Drug Use: No  . Sexual Activity: Not on file   Other Topics  Concern  . Not on file   Social History Narrative   Lives in [place] with his wife.  Does not routinely exercise.     Review of Systems: Limited as patient is short of breath and his speech is difficto understand.  Vital Signs: Blood pressure 101/60, pulse 90, temperature 96.8 F (36 C), temperature source Axillary, resp. rate 32, height 5\' 9"  (1.753 m), weight 101.152 kg (223 lb), SpO2 99 %.  No intake or output data in the 24 hours ending 09/05/14 1249  Weight trends: Filed Weights   09/05/14 0815  Weight: 101.152 kg (223 lb)    Physical Exam: General:  mild to moderate respiratory distress,  HEENT Muddy sclera, moist because membranes  Neck:  supple  Lungs: Diffuse bilateral crackles, nonrebreather mask oxygen  Heart:: irregular,  Abdomen: Soft, nondistended, nontender  Extremities:  trace to 1+ peripheral edema  Neurologic: Alert, able to follow simple commands  Skin: No acute rashes  Access: Right femoral PermCath  Foley: no       Lab results: Basic Metabolic Panel: No results for input(s): NA, K, CL, CO2, GLUCOSE, BUN, CREATININE, CALCIUM, MG, PHOS in the last 168 hours.  Liver Function Tests: No results for input(s): AST, ALT, ALKPHOS, BILITOT, PROT, ALBUMIN in the last 168 hours. No results for input(s): LIPASE, AMYLASE in the last 168 hours. No results for input(s): AMMONIA in the last 168 hours.  CBC: No results for input(s): WBC, NEUTROABS, HGB, HCT, MCV, PLT in the last 168 hours.  Cardiac Enzymes: No results for input(s): CKTOTAL, TROPONINI in the last 168 hours.  BNP: Invalid input(s): POCBNP  CBG:  Recent Labs Lab 09/05/14 5784  ONGEXB 28    Microbiology: No results found for this or any previous visit (from the past 720 hour(s)).   Coagulation Studies: No results for input(s): LABPROT, INR in the last 72 hours.  Urinalysis: No results for input(s): COLORURINE, LABSPEC, PHURINE, GLUCOSEU, HGBUR, BILIRUBINUR, KETONESUR, PROTEINUR,  UROBILINOGEN, NITRITE, LEUKOCYTESUR in the last 72 hours.  Invalid input(s): APPERANCEUR    Imaging:  No results found.   Assessment & Plan: Pt is a 64 y.o. yo male with a PMHX of end-stage renal disease, chronic systolic congestive heart failurith EF 20-25% by 2-D echo in December 2015, was admitted on 09/05/2014 for PermCath placement and shortness of breath post procedure  1.  End-stage renal disease.  Patient is unsure of his usual dialysis unit.  It is somewhere in [place].  His last dose treatment was yesterday. 2.  Acute pulmonary edema. Chest x-ray is pending We will plan for emergent dialysis. Patient is noted to be admitted and suggesting that his usual blood pressure remains low Fluid will be removed as tolerated Patient will be dialyzed in the ICU 3. Anemia of chronic kidney disease. - We will monitor his hemoglobin closely 4.Secondary hyperparathyroidism - Monitor phosphorus closely

## 2014-09-05 NOTE — Care Management Note (Signed)
Patient is active at [facility], M-W-F.  Patient is currently in ICU.  I will send hospital records to the clinic at discharge. Iran Sizer [phone]

## 2014-09-05 NOTE — Progress Notes (Signed)
Contacted by Ardelle Lesches, RN for patient in respiratory distress. On arrival, patient in high fowlers position.Nonrebreather with high flow oxygen.Requested rapid response. Increased work of breathing, increased resp rate. Appears to be in respiratory distress. Audible rhonchi when standing next to patient.Hospitalist paged, shortly after return page Dr. Lucky Cowboy had returned to room. Ordered transfer to ICU.Plan discussed with patient and he agreed. Patient transported to ICU, high flow O2 for transfer. RN in room on arrival to ICU

## 2014-09-05 NOTE — OR Nursing (Signed)
Received to vascular lab SOB, placed on 100% NRB patient is A&O reports feeling SOB, unable to get a VS on left ankle or left wrist, MD aware, placing a temp. Dialysis cath to left chest wall

## 2014-09-05 NOTE — Op Note (Signed)
OPERATIVE NOTE    PRE-OPERATIVE DIAGNOSIS: 1. ESRD with failing AV fistula  POST-OPERATIVE DIAGNOSIS: same as above with jugular venous occlusion  PROCEDURE: 1. Ultrasound guidance for vascular access to the right femoral  vein 2. Fluoroscopic guidance for placement of catheter 3. Placement of a 44 cm tip to cuff tunneled hemodialysis catheter via the right femoral vein 4. Ultrasound guidance for vascular access left jugular vein. 5. Left jugular venogram  SURGEON: Moyses Pavey, MD  ANESTHESIA:  Local/MCS  ESTIMATED BLOOD LOSS: 25 cc  FINDING(S): 1.  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   The patient needs long term dialysis access for their ESRD, and a Permcath is necessary.  Risks and benefits are discussed and informed consent is obtained.    DESCRIPTION: After obtaining full informed written consent, the patient was brought back to the vascular suited. I initially started by prepping the left neck where the jugular vein appeared to be patent. The jugular vein was accessed under direct ultrasound guidance without difficulty with a micropuncture needle and a permanent image was recorded. The wire would only pass a few centimeters to just below the level of the clavicle however. I then placed a micropuncture sheath over the wire and performed a jugular venogram. This demonstrated occlusion of the jugular vein at the level of the clavicle. Attempts to cross this occlusion with a Glidewire and a Kumpe catheter were unsuccessful and this was a chronic occlusion with multiple collaterals. I then elected to place a femoral PermCath, as his right jugular vein appeared occluded on ultrasound and I did not expect this to be usable.  The patient's right groin was sterilely prepped and draped and a sterile surgical field was  created.  The right femoral vein was visualized with ultrasound and found to be patent. It was then accessed under direct ultrasound guidance and a permanent image was recorded. A wire was placed. After skin nick and dilatation, the peel-away sheath was placed over the wire. I then turned my attention to an area about 4 cm inferior and lateral to the access incision and a small counterincision was created.  I tunneled from the counter  incision to the access site. Using fluoroscopic guidance, a 10 centimeterer tip to cuff tunneled hemodialysis catheter was selected, and tunneled from the counter  incision to the access site. It was then placed through the peel-away sheath and the peel-away sheath was removed. Using fluoroscopic guidance the catheter tips were parked in the retrohepatic vena cava just below the atrium. The appropriate distal connectors were placed. It withdrew blood well and flushed easily with heparinized saline and a concentrated heparin solution was then placed. It was secured to the leg  with 2 Prolene sutures. The access incision was closed single 4-0 Monocryl. A 4-0 Monocryl pursestring suture was placed around the exit site. Sterile dressings were placed. The patient tolerated the procedure well and was taken to the recovery room in stable condition.  COMPLICATIONS: None  CONDITION: Stable    [name] 09/05/2014 1:12 PM

## 2014-09-05 NOTE — H&P (Signed)
[facility] History & Physical Update  The patient was interviewed and re-examined.  The patient's previous History and Physical has been reviewed and is unchanged.  There is no change in the plan of care. We plan to proceed with the scheduled procedure.  Labresha Mellor, MD  09/05/2014, 9:04 AM

## 2014-09-05 NOTE — H&P (Addendum)
[facility] at [place] NAME: [name]    MR#:  [id number]  DATE OF BIRTH:  [birth year]  DATE OF ADMISSION:  09/05/2014  PRIMARY CARE PHYSICIAN: Estanislado Emms, MD   REQUESTING/REFERRING PHYSICIAN: Leotis Pain  CHIEF COMPLAINT:  No chief complaint on file.   HISTORY OF PRESENT ILLNESS: [name]  is a 64 y.o. male with a known history of end-stage renal disease on hemodialysis, gastroesophageal reflux disease, chronic obstructive pulmonary disease, , coronary artery disease- was brought in by Dr. deal for procedure of permacath placement because of poor vascular access. The procedure was done but after that in the recovery room he was noted to have severe respiratory distress, rapid response was called in and he was started on nonrebreather mask. He also had edema on his limbs, his last dialysis was yesterday as he stays. Dr. dew called in for direct admit to stepdown unit and for urgent hemodialysis as he was fluid overloaded. Patient denied any chest pain cough or sputum production.  PAST MEDICAL HISTORY:   Past Medical History  Diagnosis Date  . ESRD (end stage renal disease) 1996    a. BMS [facility] [facility], M-W-F  . Anemia   . GERD (gastroesophageal reflux disease)   . Nonischemic cardiomyopathy     a. dx 2005;  b. 04/2009 Echo: EF  >55%;  c. 12/2013 Echo: EF 20-25%, diff HK, inflat/infsept/inf AK. Mild AS, mod MR, sev dil LA, PASP 47 mmHg.  Marland Kitchen Perirectal abscess     a. s/p I & D 2013.  Marland Kitchen Secondary hyperparathyroidism   . Chronic obstructive pulmonary disease   . Retroperitoneal mass     a. 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  . Edema extremities     left arm  . Myocardial infarction   . Coronary artery disease     PAST SURGICAL HISTORY:  Past Surgical History  Procedure Laterality Date  . Insertion of dialysis catheter     . Av fistula placement      Numerous fistulas placed in both arms and left leg; using rt arm fistula presently  . Examination under anesthesia  03/18/2011    Procedure: EXAM UNDER ANESTHESIA;  Surgeon: [name], MD;  Location: [facility];  Service: General;  Laterality: N/A;  exam under anesthesia, incision and drainage of perirectal abscess  . Parathyroidectomy    . Left and right heart catheterization with coronary angiogram N/A 01/31/2014    Procedure: LEFT AND RIGHT HEART CATHETERIZATION WITH CORONARY ANGIOGRAM;  Surgeon: Blane Ohara, MD;  Location: [facility] CATH LAB;  Service: Cardiovascular;  Laterality: N/A;  . Cardiac catheterization      SOCIAL HISTORY:  Social History  Substance Use Topics  . Smoking status: Former Smoker -- 2.00 packs/day for 15 years    Types: Cigarettes    Quit date: 03/11/1983  . Smokeless tobacco: Never Used  . Alcohol Use: No    FAMILY HISTORY:  Family History  Problem Relation Age of Onset  . Heart failure Mother   . Heart failure Father   . Heart attack Brother     DRUG ALLERGIES: No Known Allergies  REVIEW OF SYSTEMS:   CONSTITUTIONAL: No fever, fatigue or weakness.  EYES: No blurred or double vision.  EARS, NOSE, AND THROAT: No tinnitus or ear pain.  RESPIRATORY: No cough, shortness of breath, wheezing or  hemoptysis.  CARDIOVASCULAR: No chest pain, orthopnea, edema.  GASTROINTESTINAL: No nausea, vomiting, diarrhea or abdominal pain.  GENITOURINARY: No dysuria, hematuria.  ENDOCRINE: No polyuria, nocturia,  HEMATOLOGY: No anemia, easy bruising or bleeding SKIN: No rash or lesion. MUSCULOSKELETAL: No joint pain or arthritis.   NEUROLOGIC: No tingling, numbness, weakness.  PSYCHIATRY: No anxiety or depression.   MEDICATIONS AT HOME:  Prior to Admission medications   Medication Sig Start Date End Date Taking? Authorizing Provider  aspirin EC 81 MG tablet Take 81 mg by mouth daily.   Yes Historical Provider, MD  cinacalcet (SENSIPAR)  30 MG tablet Take 30 mg by mouth daily.   Yes Historical Provider, MD  lanthanum (FOSRENOL) 500 MG chewable tablet Chew 1,000 mg by mouth 3 (three) times daily after meals.    Yes Historical Provider, MD  midodrine (PROAMATINE) 10 MG tablet Take 10 mg by mouth 3 (three) times daily.    Yes Historical Provider, MD     PHYSICAL EXAMINATION:   VITAL SIGNS: Blood pressure 97/71, pulse 90, temperature 96.8 F (36 C), temperature source Axillary, resp. rate 21, height 5\' 9"  (1.753 m), weight 101.152 kg (223 lb), SpO2 100 %.  GENERAL:  64 y.o.-year-old patient lying in the bed with acute respi distress.  EYES: Pupils equal, round, reactive to light and accommodation. No scleral icterus. Extraocular muscles intact.  HEENT: Head atraumatic, normocephalic. Oropharynx and nasopharynx clear.  NECK:  Supple, no jugular venous distention. No thyroid enlargement, no tenderness.  LUNGS: equal breath sounds bilaterally,mild wheezing, and crepitation. positive use of accessory muscles of respiration. An fast rate of breathing. CARDIOVASCULAR: S1, S2 normal. No murmurs, rubs, or gallops.  ABDOMEN: Soft, nontender, nondistended. Bowel sounds present. No organomegaly or mass.  EXTREMITIES: severe pedal edema,  No cyanosis, or clubbing. edema on hands also.   Right forearm AV fistula present. NEUROLOGIC: Cranial nerves II through XII are intact. Muscle strength 5/5 in all extremities. Sensation intact. Gait not checked.  PSYCHIATRIC: The patient is alert and oriented x 3.  SKIN: No obvious rash, lesion, or ulcer.   LABORATORY PANEL:   CBC No results for input(s): WBC, HGB, HCT, PLT, MCV, MCH, MCHC, RDW, LYMPHSABS, MONOABS, EOSABS, BASOSABS, BANDABS in the last 168 hours.  Invalid input(s): NEUTRABS, BANDSABD ------------------------------------------------------------------------------------------------------------------  Chemistries  No results for input(s): NA, K, CL, CO2, GLUCOSE, BUN, CREATININE,  CALCIUM, MG, AST, ALT, ALKPHOS, BILITOT in the last 168 hours.  Invalid input(s): GFRCGP ------------------------------------------------------------------------------------------------------------------ estimated creatinine clearance is 14.5 mL/min (by C-G formula based on Cr of 6.19). ------------------------------------------------------------------------------------------------------------------ No results for input(s): TSH, T4TOTAL, T3FREE, THYROIDAB in the last 72 hours.  Invalid input(s): FREET3   Coagulation profile No results for input(s): INR, PROTIME in the last 168 hours. ------------------------------------------------------------------------------------------------------------------- No results for input(s): DDIMER in the last 72 hours. -------------------------------------------------------------------------------------------------------------------  Cardiac Enzymes No results for input(s): CKMB, TROPONINI, MYOGLOBIN in the last 168 hours.  Invalid input(s): CK ------------------------------------------------------------------------------------------------------------------ Invalid input(s): POCBNP  ---------------------------------------------------------------------------------------------------------------  Urinalysis No results found for: COLORURINE, APPEARANCEUR, LABSPEC, PHURINE, GLUCOSEU, HGBUR, BILIRUBINUR, KETONESUR, PROTEINUR, UROBILINOGEN, NITRITE, LEUKOCYTESUR   RADIOLOGY: No results found.  IMPRESSION AND PLAN:  * Acute respiratory failure  Due to fluid overload Admitted to stepdown with nonrebreather mask, call nephrology consult for urgent hemodialysis. Patient also very low ejection fraction, I will call cardiology   * History of renal disease on hemodialysis  Manage per nephrology. he is being taken for urgent dialysis today.  * Chronic systolic CHF, with acute exacerbation  Ejection fraction 20% as per  previous records  Husband  hemodialysis and cardiology consult.  * CAD   Cont ASA, Need to confirm his home meds- so we need to resume later.   All the records are reviewed and case discussed with ED provider. Management plans discussed with the patient, family and they are in agreement.  CODE STATUS: Full   TOTAL TIME TAKING CARE OF THIS PATIENT: 50 minutes- critical care.    Vaughan Basta M.D on 09/05/2014   Between 7am to 6pm - Pager - [phone]  After 6pm go to [email or web address] - password [email or web address] [facility]  Office  [phone]  CC: Primary care physician; Estanislado Emms, MD

## 2014-09-05 NOTE — Progress Notes (Signed)
Called to inform dr dew pt with labored rr 28/min, tolerates supine position with 100% sat on room air, frequent np cough noted, daughter explains pt awaiting outpatient xray to r/o pneumonia. Order for neb received from md

## 2014-09-06 ENCOUNTER — Encounter: Payer: Self-pay | Admitting: Student

## 2014-09-06 DIAGNOSIS — J9601 Acute respiratory failure with hypoxia: Principal | ICD-10-CM

## 2014-09-06 DIAGNOSIS — Z992 Dependence on renal dialysis: Secondary | ICD-10-CM | POA: Insufficient documentation

## 2014-09-06 DIAGNOSIS — N186 End stage renal disease: Secondary | ICD-10-CM

## 2014-09-06 DIAGNOSIS — E8770 Fluid overload, unspecified: Secondary | ICD-10-CM

## 2014-09-06 DIAGNOSIS — E440 Moderate protein-calorie malnutrition: Secondary | ICD-10-CM | POA: Insufficient documentation

## 2014-09-06 DIAGNOSIS — I5021 Acute systolic (congestive) heart failure: Secondary | ICD-10-CM | POA: Insufficient documentation

## 2014-09-06 LAB — BASIC METABOLIC PANEL
Anion gap: 10 (ref 5–15)
BUN: 19 mg/dL (ref 6–20)
CALCIUM: 8.8 mg/dL — AB (ref 8.9–10.3)
CO2: 32 mmol/L (ref 22–32)
Chloride: 99 mmol/L — ABNORMAL LOW (ref 101–111)
Creatinine, Ser: 5.35 mg/dL — ABNORMAL HIGH (ref 0.61–1.24)
GFR calc non Af Amer: 10 mL/min — ABNORMAL LOW (ref >60)
GFR, EST AFRICAN AMERICAN: 12 mL/min — AB (ref >60)
GLUCOSE: 140 mg/dL — AB (ref 65–99)
POTASSIUM: 3.2 mmol/L — AB (ref 3.5–5.1)
SODIUM: 141 mmol/L (ref 135–145)

## 2014-09-06 LAB — CBC
HCT: 32.9 % — ABNORMAL LOW (ref 40.0–52.0)
Hemoglobin: 10.8 g/dL — ABNORMAL LOW (ref 13.0–18.0)
MCH: 30.9 pg (ref 26.0–34.0)
MCHC: 32.7 g/dL (ref 32.0–36.0)
MCV: 94.4 fL (ref 80.0–100.0)
PLATELETS: 147 10*3/uL — AB (ref 150–440)
RBC: 3.48 MIL/uL — AB (ref 4.40–5.90)
RDW: 15.4 % — ABNORMAL HIGH (ref 11.5–14.5)
WBC: 8.3 10*3/uL (ref 3.8–10.6)

## 2014-09-06 LAB — HEPATITIS B SURFACE ANTIGEN: HEP B S AG: NEGATIVE

## 2014-09-06 MED ORDER — MIDODRINE HCL 5 MG PO TABS
10.0000 mg | ORAL_TABLET | Freq: Every day | ORAL | Status: DC | PRN
  Administered 2014-09-06 (×2): 10 mg via ORAL
  Filled 2014-09-06 (×2): qty 2

## 2014-09-06 MED ORDER — NEPRO/CARBSTEADY PO LIQD
237.0000 mL | Freq: Every day | ORAL | Status: DC
  Administered 2014-09-07 – 2014-09-12 (×4): 237 mL via ORAL

## 2014-09-06 MED ORDER — EPOETIN ALFA 4000 UNIT/ML IJ SOLN
4000.0000 [IU] | INTRAMUSCULAR | Status: DC
  Administered 2014-09-06 – 2014-09-11 (×4): 4000 [IU] via INTRAVENOUS
  Filled 2014-09-06 (×4): qty 1

## 2014-09-06 MED ORDER — MIDODRINE HCL 5 MG PO TABS
10.0000 mg | ORAL_TABLET | Freq: Three times a day (TID) | ORAL | Status: DC
  Administered 2014-09-06 – 2014-09-12 (×17): 10 mg via ORAL
  Filled 2014-09-06 (×17): qty 2

## 2014-09-06 MED ORDER — MIDODRINE HCL 5 MG PO TABS: 10.0000 mg | ORAL_TABLET | Freq: Every day | ORAL | Status: DC | PRN

## 2014-09-06 NOTE — Progress Notes (Signed)
[facility] at [place] NAME: [name]    MR#:  [id number]  DATE OF BIRTH:  1950-10-19  SUBJECTIVE:  CHIEF COMPLAINT:  No chief complaint on file.  Groggy, sitting up eating with the assistance of his daughter. Denies chest pain shortness of breath.   REVIEW OF SYSTEMS:   Review of Systems  Constitutional: Negative for fever.  Respiratory: Negative for shortness of breath.   Cardiovascular: Positive for leg swelling. Negative for chest pain and palpitations.  Gastrointestinal: Negative for nausea, vomiting and abdominal pain.  Genitourinary: Negative for dysuria.   DRUG ALLERGIES:  No Known Allergies  VITALS:  Blood pressure 81/61, pulse 85, temperature 97.6 F (36.4 C), temperature source Oral, resp. rate 31, height 5\' 9"  (1.753 m), weight 98.9 kg (218 lb 0.6 oz), SpO2 100 %.  PHYSICAL EXAMINATION:  GENERAL:  64 y.o.-year-old patient sitting up, eating with assistance EYES: Pupils equal, round, reactive to light and accommodation. No scleral icterus. Extraocular muscles intact.  HEENT: Head atraumatic, normocephalic. Oropharynx and nasopharynx clear.  NECK:  Supple, no jugular venous distention. No thyroid enlargement, no tenderness.  LUNGS: Bibasilar crackles, short shallow respirations, no wheezes or rhonchi CARDIOVASCULAR: S1, S2 normal. No murmurs, rubs, or gallops.  ABDOMEN: Soft, nontender, nondistended. Bowel sounds present. No organomegaly or mass.  EXTREMITIES: +2 pedal edema, cyanosis, or clubbing.  NEUROLOGIC: Cranial nerves II through XII are intact. Muscle strength 5/5 in all extremities. Sensation intact. Gait not checked.  PSYCHIATRIC: The patient is groggy but oriented x 3.  SKIN: No obvious rash, lesion, or ulcer.    LABORATORY PANEL:   CBC  Recent Labs Lab 09/06/14 1436  WBC 8.3  HGB 10.8*  HCT 32.9*  PLT 147*    ------------------------------------------------------------------------------------------------------------------  Chemistries   Recent Labs Lab 09/05/14 1309  NA 143  143  K 3.2*  3.2*  CL 99*  100*  CO2 31  31  GLUCOSE 91  91  BUN 24*  24*  CREATININE 6.03*  6.20*  CALCIUM 9.1  9.1  MG 2.1   ------------------------------------------------------------------------------------------------------------------  Cardiac Enzymes No results for input(s): TROPONINI in the last 168 hours. ------------------------------------------------------------------------------------------------------------------  RADIOLOGY:  Dg Chest 1 View  09/05/2014   CLINICAL DATA:  Cardiomyopathy.  Fluid retention.  EXAM: CHEST  1 VIEW  COMPARISON:  10/11/2013  FINDINGS: Moderate cardiac enlargement noted. There is a small left pleural effusion identified. Pulmonary vascular congestion is identified.  IMPRESSION: 1. Cardiac enlargement. 2. Left pleural effusion and pulmonary vascular congestion.   Electronically Signed   By: Kerby Moors M.D.   On: 09/05/2014 14:40    EKG:   Orders placed or performed during the hospital encounter of 08/27/14  . EKG 12-Lead  . EKG 12-Lead    ASSESSMENT AND PLAN:   Acute respiratory failure with hypoxia: This is due to interstitial volume overload, likely occurred during surgery to place a PermCath. He has been admitted to the stepdown unit, no longer needing a nonrebreather mask is just on nasal cannula. Is receiving dialysis for volume removal. If continues to be hypoxic BiPAP may be helpful  End-stage renal disease on hemodialysis: He has had dialysis yesterday with removal of 1.3 L. Having dialysis again today with more volume to be removed. Nephrology following  Hypotension: Chronic, likely due to severe nonischemic cardiomyopathy with ejection fraction of 20-25%. Agree with increasing Midrin to 10 mg 3 times a day  Nonischemic cardiomyopathy, acute on  chronic systolic congestive heart failure,  nonobstructive coronary artery disease: Appreciate cardiology following. Continue to manage volume with hemodialysis  Anemia of chronic disease: Stable. No signs of bleeding  All the records are reviewed and case discussed with Care Management/Social Workerr. Management plans discussed with the patient, family and they are in agreement.  CODE STATUS: Full  TOTAL TIME TAKING CARE OF THIS PATIENT: 30 minutes.  Greater than 50% of time spent in care coordination and counseling. POSSIBLE D/C IN 1-2 DAYS, DEPENDING ON CLINICAL CONDITION.   Myrtis Ser M.D on 09/06/2014 at 3:23 PM  Between 7am to 6pm - Pager - [phone]  After 6pm go to [email or web address] - password [email or web address] [facility]  Office  [phone]  CC: Primary care physician; Estanislado Emms, MD

## 2014-09-06 NOTE — Progress Notes (Signed)
Initial Nutrition Assessment  DOCUMENTATION CODES:      INTERVENTION:   1) Meals and Snacks: Cater to patient preferences 2) Medical Food Supplement Therapy: discussed options for nutritional supplementation; per daughter, pt's potassium not well managed as outpatient. Pt has never tried Nepro; recommend trying daily for added kcals/protein. Continue to assess 3) Nutrition related medication management: phosphorus currently 4.8, on low phosphorus diet, eating at meal times. Pt takes fosrenol as outpatient with meals, may benefit from restarting, especially if phosphorus >5.5   NUTRITION DIAGNOSIS:   Inadequate oral intake related to chronic illness as evidenced by percent weight loss, mild depletion of body fat, mild depletion of muscle mass.   GOAL:   Patient will meet greater than or equal to 90% of their needs   MONITOR:    (Energy Intake, Anthropometrics, Digestive System, Electrolyte/Renal Profile, Glucose Profile)  REASON FOR ASSESSMENT:    (Renal Diet, Dialysis Patient)    ASSESSMENT:    Pt admitted with acute respiratory failure due to fluid overload, acute CHF exacerbation with EF 20%; pt received HD yesterday with 1.3 Liters removed, per report plan for HD again today  Past Medical History  Diagnosis Date  . ESRD (end stage renal disease) 1996    a. BMS [place], M-W-F  . Anemia   . GERD (gastroesophageal reflux disease)   . Nonischemic cardiomyopathy     a. dx 2005;  b. 04/2009 Echo: EF  >55%;  c. 12/2013 Echo: EF 20-25%, diff HK, inflat/infsept/inf AK. Mild AS, mod MR, sev dil LA, PASP 47 mmHg.  Marland Kitchen Perirectal abscess     a. s/p I & D 2013.  Marland Kitchen Secondary hyperparathyroidism   . Chronic obstructive pulmonary disease   . Retroperitoneal mass     a. 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  . Hypotension     On midodrine for support   . CAD (coronary artery  disease)     a. L and R cath 01/2014: widely patent coronary arteries with minimal non-obstructive CAD    Past Surgical History  Procedure Laterality Date  . Insertion of dialysis catheter    . Av fistula placement      Numerous fistulas placed in both arms and left leg; using rt arm fistula presently  . Examination under anesthesia  03/18/2011    Procedure: EXAM UNDER ANESTHESIA;  Surgeon: [name], MD;  Location: [facility];  Service: General;  Laterality: N/A;  exam under anesthesia, incision and drainage of perirectal abscess  . Parathyroidectomy    . Left and right heart catheterization with coronary angiogram N/A 01/31/2014    Procedure: LEFT AND RIGHT HEART CATHETERIZATION WITH CORONARY ANGIOGRAM;  Surgeon: Blane Ohara, MD;  Location: [facility] C[facility];  Service: Cardiovascular;  Laterality: N/A;  . Cardiac catheterization       Diet Order:  Diet renal with fluid restriction Fluid restriction:: 1200 mL Fluid; Room service appropriate?: Yes; Fluid consistency:: Thin   Energy Intake: appetite good, ate most of breakfast tray this AM. No recorded po intake  Food and nutrition related history: reports good appetite at home, wife prepares at least 2, if not 3 meals, per day for pt. Occasionally eat out for dinner. Pt typically eats well at all meals. Per daughter, pt does not usually eat breakfast meal on dialysis days (only eats 2 meals per day on dialysis days). Pt does not really  snack in between meals. Does not use supplements  Electrolyte and Renal Profile:  Recent Labs Lab 09/05/14 1309  BUN 24*  24*  CREATININE 6.03*  6.20*  NA 143  143  K 3.2*  3.2*  MG 2.1  PHOS 4.8*   Glucose Profile:  Recent Labs  09/05/14 1223  GLUCAP 83   Protein Profile:  Recent Labs Lab 09/05/14 1309  ALBUMIN 3.4*   Nutritional Anemia Profile:  CBC Latest Ref Rng 09/05/2014 08/27/2014 01/30/2014  WBC 3.8 - 10.6 K/uL 4.8 4.4 4.8  Hemoglobin 13.0 - 18.0 g/dL 11.3(L) 11.3(L) 11.0(L)   Hematocrit 40.0 - 52.0 % 35.1(L) 33.5(L) 33.8(L)  Platelets 150 - 440 K/uL 150 137(L) 126.0(L)    Skin:  Reviewed, no issues  Meds: sensipar, midodrine prn; noted pt takes fosrenol as outpatient  Nutrition Focused Physical Exam: Nutrition-Focused physical exam completed. Findings are mild fat depletion, mild muscle depletion, and moderate edema.   Digestive system: reports no problems chewing or swallowing, no N/V/D or constipation  Height:   Ht Readings from Last 1 Encounters:  09/05/14 5\' 9"  (1.753 m)    Weight: pt and daughter both acknowledge that pt has lost weight; unsure of the actual amount. Pt also verbalizes that his dry weight at dialysis has gone down as well. Based on weight encounters, 14% wt loss in 1 year; 18.7% wt loss since April 2013 based on weight encounters  Wt Readings from Last 1 Encounters:  09/05/14 218 lb 4.1 oz (99 kg)   Filed Weights   09/05/14 0815 09/05/14 1445  Weight: 223 lb (101.152 kg) 218 lb 4.1 oz (99 kg)  *1.3 L of fluid removed with HD, equivalent to 1.3 kg (2.86 pounds)  Wt Readings from Last 10 Encounters:  09/05/14 218 lb 4.1 oz (99 kg)  08/27/14 223 lb (101.152 kg)  02/14/14 238 lb (107.956 kg)  01/31/14 231 lb (104.781 kg)  01/15/14 238 lb 3.2 oz (108.047 kg)  01/09/14 231 lb 12.8 oz (105.144 kg)  12/25/13 243 lb (110.224 kg)  12/04/13 243 lb 12.8 oz (110.587 kg)  10/12/13 254 lb (115.214 kg)  05/25/11 268 lb (121.564 kg)    Ideal Body Weight:   72.7 kg  BMI:  Body mass index is 32.22 kg/(m^2).  Estimated Nutritional Needs:   Kcal:  2165-2559 kcals (BEE 1514, 1.3 AF, 1.1-1.3 IF)   Protein:  88-110 g (1.2-1.5 g/kg) using IBW 72.7 kg  Fluid:  1000 mL plus [place] MS, RD, LDN [phone] Pager

## 2014-09-06 NOTE — Progress Notes (Signed)
PRE HD   09/06/14 1300  Report  Report Received From [facility], RN  Vital Signs  Temp 97.6 F (36.4 C)  Temp Source Oral  Pulse Rate 91  Pulse Rate Source Monitor  Resp (!) 33  BP (!) 84/64 mmHg  BP Location Left Arm  BP Method Automatic  Patient Position (if appropriate) Lying  Pre Treatment Patient Checks  Vascular access used during treatment Catheter  Hepatitis B Surface Antigen Results Negative  Date Hepatitis B Surface Antigen Drawn 09/05/14  Date Hepatitis B Surface Antibody Drawn 09/05/14  Hemodialysis Consent Verified Yes  Hemodialysis Standing Orders Initiated Yes  ECG (Telemetry) Monitor On Yes  Prime Ordered Normal Saline  Length of  DialysisTreatment -hour(s) 3 Hour(s)  Dialysis Treatment Comments GOAL TO REMOVE 1L OVER3 HOURS. CVC ACCESED WITHOUT DIFFICULTY.  PT ALERT AND BP LOW.  BP SUPPORT REQUESTED FROM PRIMARY MD.    Dialyzer Optiflux 180 NR  Dialysate Other (Comment) (3K2.5CA)  Dialysis Anticoagulant None  Dialysate Flow Ordered 800  Blood Flow Rate Ordered 400 mL/min  Ultrafiltration Goal 1 Liters  Pre Treatment Labs Renal panel;CBC  Dialysis Blood Pressure Support Ordered Normal Saline

## 2014-09-06 NOTE — Progress Notes (Signed)
Subjective:  Underwent dialysis yesterday Was able to get off NRB mask 1300 cc fluid was removed     Objective:  Vital signs in last 24 hours:  Temp:  [96.2 F (35.7 C)-98.4 F (36.9 C)] 98.4 F (36.9 C) (08/12 0400) Pulse Rate:  [83-95] 94 (08/12 0700) Resp:  [18-43] 38 (08/12 0700) BP: (79-152)/(52-133) 86/62 mmHg (08/12 0700) SpO2:  [94 %-100 %] 96 % (08/12 0700) Weight:  [99 kg (218 lb 4.1 oz)] 99 kg (218 lb 4.1 oz) (08/11 1445)  Weight change:  Filed Weights   09/05/14 0815 09/05/14 1445  Weight: 101.152 kg (223 lb) 99 kg (218 lb 4.1 oz)    Intake/Output:    Intake/Output Summary (Last 24 hours) at 09/06/14 0839 Last data filed at 09/05/14 1800  Gross per 24 hour  Intake      0 ml  Output   1300 ml  Net  -1300 ml     Physical Exam: General: NAD, sitting up in bed eating breakfast  HEENT Moist mucus membraned  Neck supple  Pulm/lungs Decreased Breath sounds on left, scattered rhonchi on right, no crackles  CVS/Heart Irregular, tachycardic  Abdomen:  Soft, non tender  Extremities: Trace peripheral edema  Neurologic: Alert, speech hard to understand, follows simple commands  Skin: No acute lesions  Access: Femoral permcath       Basic Metabolic Panel:   Recent Labs Lab 09/05/14 1309  NA 143  143  K 3.2*  3.2*  CL 99*  100*  CO2 31  31  GLUCOSE 91  91  BUN 24*  24*  CREATININE 6.03*  6.20*  CALCIUM 9.1  9.1  MG 2.1  PHOS 4.8*     CBC:  Recent Labs Lab 09/05/14 1309  WBC 4.8  HGB 11.3*  HCT 35.1*  MCV 94.8  PLT 150      Microbiology:  Recent Results (from the past 720 hour(s))  MRSA PCR Screening     Status: None   Collection Time: 09/05/14  1:08 PM  Result Value Ref Range Status   MRSA by PCR NEGATIVE NEGATIVE Final    Comment:        The GeneXpert MRSA Assay (FDA approved for NASAL specimens only), is one component of a comprehensive MRSA colonization surveillance program. It is not intended to diagnose  MRSA infection nor to guide or monitor treatment for MRSA infections.     Coagulation Studies: No results for input(s): LABPROT, INR in the last 72 hours.  Urinalysis: No results for input(s): COLORURINE, LABSPEC, PHURINE, GLUCOSEU, HGBUR, BILIRUBINUR, KETONESUR, PROTEINUR, UROBILINOGEN, NITRITE, LEUKOCYTESUR in the last 72 hours.  Invalid input(s): APPERANCEUR    Imaging: Dg Chest 1 View  09/05/2014   CLINICAL DATA:  Cardiomyopathy.  Fluid retention.  EXAM: CHEST  1 VIEW  COMPARISON:  10/11/2013  FINDINGS: Moderate cardiac enlargement noted. There is a small left pleural effusion identified. Pulmonary vascular congestion is identified.  IMPRESSION: 1. Cardiac enlargement. 2. Left pleural effusion and pulmonary vascular congestion.   Electronically Signed   By: Kerby Moors M.D.   On: 09/05/2014 14:40     Medications:     . aspirin EC  81 mg Oral Daily  . cinacalcet  30 mg Oral Q breakfast  . heparin subcutaneous  5,000 Units Subcutaneous 3 times per day  . ipratropium-albuterol  3 mL Nebulization Q4H     Assessment/ Plan:  64 y.o. male male with a PMHX of end-stage renal disease, chronic systolic congestive heart failurith  EF 20-25% by 2-D echo in December 2015, was admitted on 09/05/2014 for PermCath placement and shortness of breath post procedure  1. End-stage renal disease. Patient dialyses at [facility], [place].  - Routine HD today 2. Acute pulmonary edema. UF ~ 1000 if tolerated 3. Anemia of chronic kidney disease. - We will monitor his hemoglobin closely 4.Secondary hyperparathyroidism - Monitor phosphorus closely   LOS: 1 Jumar Greenstreet 8/12/20168:39 AM

## 2014-09-06 NOTE — Progress Notes (Signed)
POST HD   09/06/14 1630  Report  Report Received From PAMELA RUMLEY, RN  Vital Signs  Temp 97.8 F (36.6 C)  Temp Source Oral  Pulse Rate 85  Pulse Rate Source Monitor  Resp (!) 29  BP (!) 79/66 mmHg  BP Location Left Arm  BP Method Automatic  Patient Position (if appropriate) Lying  Dialysis Weight  Weight 97.6 kg (215 lb 2.7 oz)  Type of Weight Post-Dialysis  During Hemodialysis Assessment  Intra-Hemodialysis Comments HD TREATMENTG END.  BLOOD RETURNED AND CVC ACCESSED PER POLICY.  PT ALERRT AND VS REMAIN STABLE.    Post-Hemodialysis Assessment  Rinseback Volume (mL) 250 mL  Dialyzer Clearance Lightly streaked  Duration of HD Treatment -hour(s) 3 hour(s)  Hemodialysis Intake (mL) 500 mL  UF Total -Machine (mL) 1500 mL  Net UF (mL) 1000 mL  Tolerated HD Treatment Yes  Post-Hemodialysis Comments HD TREATMENTG END.  BLOOD RETURNED AND CVC ACCESSED PER POLICY.  PT ALERRT AND VS REMAIN STABLE.    Education / Care Plan  Hemodialysis Education Provided Yes  Documented Education in Clinical Pathway Yes  Hemodialysis Catheter Right Femoral vein Double-lumen  Placement Date/Time: 09/05/14 1100   Placed prior to admission: Yes  Person Inserting Catheter: Dr. Lucky Cowboy  Orientation: Right  Access Location: Femoral vein  Hemodialysis Catheter Type: Double-lumen  Site Condition No complications  Blue Lumen Status Heparin locked  Red Lumen Status Heparin locked  Purple Lumen Status N/A  Catheter fill solution Heparin 1000 units/ml  Catheter fill volume (Arterial) 2.5 cc  Catheter fill volume (Venous) 2.5  Dressing Type Gauze/Drain sponge  Dressing Status Clean;Dry;Intact  Drainage Description None  Post treatment catheter status Capped and Clamped

## 2014-09-06 NOTE — Consult Note (Addendum)
Cardiology Consultation Note  Patient ID: [name], MRN: [id number], DOB/AGE: 62-Sep-1952 62 y.o. Admit date: 09/05/2014   Date of Consult: 09/06/2014 Primary Physician: Estanislado Emms, MD Primary Cardiologist: Josue Hector, MD [facility])  Chief Complaint: Shortness of breath after surgery to insert porta-cath Reason for Consult: Fluid overload- EF 20%  HPI: 64 y.o. male with h/o ESRD (MWF dialysis schedule, [place]), non-ischemic cardiomyopathy (Echo 01/10/14: EF 20-25%, diff HK, inflat/infsept/inf AK. 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 Lucky Cowboy d/t poor vascular access.  Post-surgery, the patient became very short of breath, requiring a nonrebreather with high flow oxygen.  [name] follows with [facility] (Dr. Johnsie Cancel).  He had a left and right cardiac cath with coronary angiogram in 01/2014 that showed widely patent coronary arteries with minimal non-obstructive CAD, severe non-ischemic cardiomyopathy, elevated right and left heart filling pressure, systemic hypotension, and calcified aortic valve with moderate AS.  He had an Echo 12/2013 that showed EF 20-25%, diff HK, inflat/infsept/inf AK. Mild AS, mod MR, sev dil LA, PASP 47 mmHg.  Previously, he had an Echo 04/2009 that showed EF  >55%.  On 8/11, the portacath placement procedure was done, but after that [name] was noted to have severe respiratory distressin the recovery room.  Rapid response was called in and he was started on nonrebreather mask. He also had edema in his limbs. His last dialysis was the day prior - Monday (8/10); he is on a MWF schedule at [facility] in [place].  Dr. Lucky Cowboy called in for direct admit  to stepdown unit and for urgent hemodialysis as he was fluid overloaded. Patient denied any chest pain, cough, or sputum production at that time.  Today (8/12), [name] states he is feeling much better.  He is no longer short of breath with O2 via [place].  He is not normally on O2 at home.  He has had no recent CP, SOB, cough, or sputum production prior to his portacath insertion.  He states he felt well prior to yesterday.  Per his daughter, she noticed that he has become more weak lately.  He can walk just short distances and he will become tired and have to stop.  He says he eats out a couple of times a week but watches his salt when he eats at home.  He sleeps on 1 pillow with the bed flat, and that has not changed recently.  Last night in the ICU he slept well on 1 pillow with the bed completely flat.  He is eating breakfast this morning and says his appetite is good.  He says he never misses dialysis.    Past Medical History  Diagnosis Date  . ESRD (end stage renal disease) 1996    a. BMS [place], M-W-F  . Anemia   . GERD (gastroesophageal reflux disease)   . Nonischemic cardiomyopathy     a. dx 2005;  b. 04/2009 Echo: EF  >55%;  c. 12/2013 Echo: EF 20-25%, diff HK, inflat/infsept/inf AK. Mild AS, mod MR, sev dil LA, PASP 47 mmHg.  Marland Kitchen Perirectal abscess     a. s/p I & D 2013.  Marland Kitchen Secondary hyperparathyroidism   . Chronic obstructive pulmonary disease   .  Retroperitoneal mass     a. 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  . Hypotension     On midodrine for support   . CAD (coronary artery disease)     a. L and R cath 01/2014: widely patent coronary arteries with minimal non-obstructive CAD       Most Recent Cardiac Studies: Left and Right Cardiac Cath with Coronary Angiogram (01/31/14): Final Conclusions:  1. Widely patent coronary arteries with minimal nonobstructive CAD 2. Severe  nonischemic cardiomyopathy by echo 3. Elevated right and left heart filling pressures with systemic hypotension 4. Calcified aortic valve with probable moderate low gradient aortic stenosis Recommendations:  The patient will follow-up with Dr. Johnsie Cancel. He has been evaluated for preoperative cardiac clearance. The findings of severe LV dysfunction and markedly elevated intracardiac filling pressures are poor prognostic signs in this patient on hemodialysis with systemic hypotension which does not allow for aggressive medical management.  Echo (01/10/14): Study Conclusions: - Left ventricle: E/e&'>16 consistent with elevated LV filling pressures. The cavity size was severely dilated. Systolic function was severely reduced. The estimated ejection fraction was in the range of 20% to 25%. Diffuse hypokinesis. There is akinesis of the entireinferolateral myocardium. There is akinesis of the entireinferoseptal myocardium. There is akinesis of the entireinferior myocardium. - Aortic valve: There is at least mild aortic stenosis present which appears mild by peak velocity and mean gradient, but the gradient and severity of AS is most likely underestimated due to severity of LV dysfunction. Severe diffuse thickening and calcification. Valve mobility was restricted. - Mitral valve: Calcified annulus. Mild diffuse thickening and calcification of the anterior leaflet and posterior leaflet, with mild involvement of chords. There was moderate regurgitation. - Left atrium: The atrium was severely dilated. - Right ventricle: The cavity size was mildly dilated. Wall thickness was normal. - Atrial septum: There was increased thickness of the septum, consistent with lipomatous hypertrophy. - Pulmonary arteries: PA peak pressure: 47 mm Hg (S). Impressions: - The right ventricular systolic pressure was increased consistent with moderate pulmonary hypertension.   Surgical  History:  Past Surgical History  Procedure Laterality Date  . Insertion of dialysis catheter    . Av fistula placement      Numerous fistulas placed in both arms and left leg; using rt arm fistula presently  . Examination under anesthesia  03/18/2011    Procedure: EXAM UNDER ANESTHESIA;  Surgeon: [name], MD;  [facility]: [facility];  Service: General;  Laterality: N/A;  exam under anesthesia, incision and drainage of perirectal abscess  . Parathyroidectomy    . Left and right heart catheterization with coronary angiogram N/A 01/31/2014    Procedure: LEFT AND RIGHT HEART CATHETERIZATION WITH CORONARY ANGIOGRAM;  Surgeon: Blane Ohara, MD;  Location: [facility] CATH LAB;  Service: Cardiovascular;  Laterality: N/A;  . Cardiac catheterization       Home Meds: Prior to Admission medications   Medication Sig Start Date End Date Taking? Authorizing Provider  aspirin EC 81 MG tablet Take 81 mg by mouth daily.   Yes Historical Provider, MD  cinacalcet (SENSIPAR) 30 MG tablet Take 30 mg by mouth daily.   Yes Historical Provider, MD  lanthanum (FOSRENOL) 1000 MG chewable tablet Chew 1,000-2,500 mg by mouth 5 (five) times daily. Pt takes one tablet with snacks and two and one-half tablets with meals.   Yes Historical Provider, MD  midodrine (PROAMATINE)  10 MG tablet Take 10 mg by mouth 3 (three) times daily.    Yes Historical Provider, MD  pantoprazole (PROTONIX) 40 MG tablet Take 40 mg by mouth daily.   Yes Historical Provider, MD    Inpatient Medications:  . aspirin EC  81 mg Oral Daily  . cinacalcet  30 mg Oral Q breakfast  . epoetin (EPOGEN/PROCRIT) injection  4,000 Units Intravenous Q M,W,F-HD  . heparin subcutaneous  5,000 Units Subcutaneous 3 times per day  . ipratropium-albuterol  3 mL Nebulization Q4H      Allergies: No Known Allergies  Social History   Social History  . Marital Status: Married    Spouse Name: N/A  . Number of Children: N/A  . Years of Education: N/A   Occupational  History  . retired    Social History Main Topics  . Smoking status: Former Smoker -- 2.00 packs/day for 15 years    Types: Cigarettes    Quit date: 03/11/1983  . Smokeless tobacco: Never Used  . Alcohol Use: No  . Drug Use: No  . Sexual Activity: Not on file   Other Topics Concern  . Not on file   Social History Narrative   Lives in [place] with his wife.  Does not routinely exercise.     Family History  Problem Relation Age of Onset  . Heart failure Mother   . Heart failure Father   . Heart attack Brother      Review of Systems: Review of Systems  Constitutional: Positive for malaise/fatigue. Negative for fever, chills, weight loss and diaphoresis.       Gets tired walking short distances, has to stop  HENT: Negative for congestion, ear discharge and ear pain.   Eyes: Negative for pain and discharge.  Respiratory: Positive for shortness of breath and wheezing. Negative for cough and sputum production.        No SOB or wheezing at baseline, but had SOB after surgery yesterday (8/11)  Cardiovascular: Positive for leg swelling. Negative for chest pain, orthopnea and PND.  Gastrointestinal: Negative for nausea, vomiting and abdominal pain.  Musculoskeletal: Negative for falls.  Skin: Negative for rash.  Neurological: Positive for weakness. Negative for dizziness and loss of consciousness.  Endo/Heme/Allergies: Does not bruise/bleed easily.  Psychiatric/Behavioral: The patient is not nervous/anxious.   All other systems reviewed and are negative.   Labs: No results for input(s): CKTOTAL, CKMB, TROPONINI in the last 72 hours. Lab Results  Component Value Date   WBC 4.8 09/05/2014   HGB 11.3* 09/05/2014   HCT 35.1* 09/05/2014   MCV 94.8 09/05/2014   PLT 150 09/05/2014     Recent Labs Lab 09/05/14 1309  NA 143  143  K 3.2*  3.2*  CL 99*  100*  CO2 31  31  BUN 24*  24*  CREATININE 6.03*  6.20*  CALCIUM 9.1  9.1  GLUCOSE 91  91   Radiology/Studies:  Dg  Chest 1 View  09/05/2014   CLINICAL DATA:  Cardiomyopathy.  Fluid retention.  EXAM: CHEST  1 VIEW  COMPARISON:  10/11/2013  FINDINGS: Moderate cardiac enlargement noted. There is a small left pleural effusion identified. Pulmonary vascular congestion is identified.  IMPRESSION: 1. Cardiac enlargement. 2. Left pleural effusion and pulmonary vascular congestion.   Electronically Signed   By: Kerby Moors M.D.   On: 09/05/2014 14:40   Ct Head Wo Contrast  08/26/2014   CLINICAL DATA:  64 year old male fell yesterday. History of dementia. End-stage renal  disease. Initial encounter.  EXAM: CT HEAD WITHOUT CONTRAST  TECHNIQUE: Contiguous axial images were obtained from the base of the skull through the vertex without intravenous contrast.  COMPARISON:  None.  FINDINGS: No skull fracture or intracranial hemorrhage.  No CT evidence of large acute infarct.  No intracranial mass lesion noted on this unenhanced exam.  Prominent vascular calcifications.  No hydrocephalus.  Mucosal thickening/ partial opacification right sphenoid sinus, left maxillary sinus, left frontal sinus and ethmoid sinus air cells.  No orbital abnormality detected.  IMPRESSION: No skull fracture or intracranial hemorrhage.  No CT evidence of large acute infarct.  Mucosal thickening/ partial opacification right sphenoid sinus, left maxillary sinus, left frontal sinus and ethmoid sinus air cells.   Electronically Signed   By: Genia Del M.D.   On: 08/26/2014 16:43    EKG: NSR, 92 BPM, PACs, non-specific lateral ST changes  Weights: Filed Weights   09/05/14 0815 09/05/14 1445  Weight: 223 lb (101.152 kg) 218 lb 4.1 oz (99 kg)     Physical Exam: Blood pressure 91/69, pulse 97, temperature 97.7 F (36.5 C), temperature source Axillary, resp. rate 37, height 5\' 9"  (1.753 m), weight 218 lb 4.1 oz (99 kg), SpO2 100 %. Body mass index is 32.22 kg/(m^2). General: Well developed, well nourished, in no acute distress. [place], sitting up in bed  eating breakfast.  Difficult to understand d/t slurred speech. Wearing O2 [place]. Head: Normocephalic, atraumatic, sclera non-icteric, no xanthomas, nares are without discharge.  Neck: Negative for carotid bruits. JVD not elevated. Lungs: Clear bilaterally to auscultation without wheezes, rales, or rhonchi. Breathing is unlabored. Heart: RRR with S1 S2. No murmurs, rubs, or gallops appreciated. Abdomen: Soft, non-tender, non-distended with normoactive bowel sounds. No hepatomegaly. No rebound/guarding. No obvious abdominal masses. Msk:  Strength and tone appear normal for age. Extremities: No clubbing or cyanosis. 3+ pitting edema up to thighs.  Distal pedal pulses are 2+ and equal bilaterally. Neuro: Alert and oriented X 3. No facial asymmetry. No focal deficit. Moves all extremities spontaneously. Psych:  Responds to questions appropriately with a normal affect.    Assessment and Plan:  65 y.o. male with h/o ESRD (MWF dialysis schedule, [place]), non-ischemic cardiomyopathy (Echo 01/10/14: EF 20-25%, diff HK, inflat/infsept/inf AK. 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 Post-surgery, the patient became very short of breath, requiring a nonrebreather with high flow oxygen.  1.Acute respiratory distress -Likely d/t fluid overload with surgery 8/11 in the setting of ESRD (MWF dialysis) -Had additional dialysis yesterday (Tuesday 8/11). Routine HD today (Wednesday 8/12) -Significant pitting edema and need for O2 [place] today; not normally on O2 at home -May need to remove excess fluid  -Per nephrology  2. End-stage renal disease.  -Patient on dialyses at [facility], [place].  MWF  schedule.  -Per nephrology  3. Hypotension -Has hx of hypotension -BP's this admission running upper 70's to low 94'B systolic -Low BP's may preclude adequate fluid removal via dialysis -Midodrine ordered TID, per Dr. Rockey Situ  4. Non-ischemic cardiomyopathy and non-obstructive CAD -Cath 01/2014 showed widely patent coronary arteries with minimal non-obstructive CAD and severe non-ischemic cardiomyopathy (see above) -Echo 12/2013 as above -Not a good candidate for intervention -No need for further work-up  5. Acute pulmonary edema -Per nephrology -Dialysis scheduled as above  6. Anemia of chronic kidney disease. -Per IM -Monitoring H&H  7.Secondary hyperparathyroidism -Per IM - Monitoring phosphorus   8. GERD -Per IM  SignedChristell Faith, PA-C Pager: [phone] 09/06/2014, 8:57 AM   Attending Note Patient seen and examined, agree with detailed note above,  Patient presentation and plan discussed on rounds.   Long discussion with daughter concerning the patient's presentation He has had several weeks of worsening shortness of breath, leg swelling, weight gain Now only able to walk several steps with his walker  before getting short of breath Seen by nephrology in [place] He is not able to make any urine Reports that he does not drink excessively Blood pressure low, 80 systolic Midodrine ordered by nephrology this morning predialysis  --Would agree with notes above, He needs aggressive HD for acute on chronic systolic CHF, fluid retention. Unable to make urine, will need to do this through dialysis. --Once he has had aggressive fluid removal, will need to record this dry weight for nephrology in [place].  Unable to add any heart failure medications at this time given his hypotension -If blood pressure continues to drop despite midodrine 10 mg, may need to increase midodrine up to 20 mg especially prior to dialysis   Signed: Esmond Plants  M.D.,  Ph.D. [facility]

## 2014-09-06 NOTE — Progress Notes (Signed)
PRE HD   09/06/14 1300  Neurological  Level of Consciousness Alert  Orientation Level Oriented X4  Respiratory  Respiratory Pattern Regular  Chest Assessment Chest expansion symmetrical  Bilateral Breath Sounds Coarse crackles  Cough Non-productive  Cardiac  Pulse Regular  ECG Monitor Yes  Cardiac Rhythm NSR  Vascular  Edema Generalized  Generalized Edema +3  Integumentary  Integumentary (WDL) WDL  Musculoskeletal  Musculoskeletal (WDL) X  Generalized Weakness Yes  Assistive Device Front wheel walker  Gastrointestinal  Bowel Sounds Assessment Active  GU Assessment  Genitourinary (WDL) X  Genitourinary Symptoms Other (Comment) (HD)  Psychosocial  Psychosocial (WDL) WDL

## 2014-09-06 NOTE — Progress Notes (Signed)
   09/06/14 1300  Report  Report Received From Jerrye Beavers, RN  Vital Signs  Temp 97.6 F (36.4 C)  Temp Source Oral  Pulse Rate 91  Pulse Rate Source Monitor  Resp (!) 33  BP (!) 84/64 mmHg  BP Location Left Arm  BP Method Automatic  Patient Position (if appropriate) Lying  Oxygen Therapy  SpO2 100 %  O2 Device Nasal Cannula  Pain Assessment  Pain Assessment No/denies pain  Pain Score 0  Dialysis Weight  Weight 98.9 kg (218 lb 0.6 oz)  Type of Weight Pre-Dialysis  Time-Out for Hemodialysis  What Procedure? HEMODIALYSIS  Pt Identifiers(min of two) First/Last Name;MRN/Account#  Correct Site? Yes  Correct Side? Yes  Correct Procedure? Yes  Consents Verified? Yes  Rad Studies Available? N/A  Safety Precautions Reviewed? Yes  Machine Checks  Machine Number [id number]  UF/Alarm Test Passed  Conductivity: Meter 13.9  Conductivity: Machine  14.1  pH 7.4  Reverse Osmosis 5709  Dialyzer Lot Number [id number]  Disposable Set Lot Number [id number]  Machine Temperature 98.6 F (37 C)  Musician and Audible Yes  Blood Lines Intact and Secured Yes  Pre Treatment Patient Checks  Vascular access used during treatment Catheter  Hepatitis B Surface Antigen Results Negative  Date Hepatitis B Surface Antigen Drawn 09/05/14  Date Hepatitis B Surface Antibody Drawn 09/05/14  Hemodialysis Consent Verified Yes  Hemodialysis Standing Orders Initiated Yes  ECG (Telemetry) Monitor On Yes  Prime Ordered Normal Saline  Length of  DialysisTreatment -hour(s) 3 Hour(s)  Dialysis Treatment Comments GOAL TO REMOVE 1L OVER3 HOURS. CVC ACCESED WITHOUT DIFFICULTY.  PT ALERT AND BP LOW.  BP SUPPORT REQUESTED FROM PRIMARY MD.    Dialyzer Optiflux 180 NR  Dialysate Other (Comment) (3K2.5CA)  Dialysis Anticoagulant None  Dialysate Flow Ordered 800  Blood Flow Rate Ordered 400 mL/min  Ultrafiltration Goal 1 Liters  Pre Treatment Labs Renal panel;CBC  Dialysis Blood Pressure Support Ordered  Normal Saline

## 2014-09-07 ENCOUNTER — Inpatient Hospital Stay: Payer: Medicare Other

## 2014-09-07 ENCOUNTER — Inpatient Hospital Stay (HOSPITAL_COMMUNITY)
Admission: RE | Admit: 2014-09-07 | Discharge: 2014-09-07 | Disposition: A | Discharge status: Home / Self Care | Payer: Medicare Other | Source: Ambulatory Visit | Attending: Nephrology | Admitting: Nephrology

## 2014-09-07 DIAGNOSIS — I5022 Chronic systolic (congestive) heart failure: Secondary | ICD-10-CM

## 2014-09-07 DIAGNOSIS — I340 Nonrheumatic mitral (valve) insufficiency: Secondary | ICD-10-CM

## 2014-09-07 DIAGNOSIS — I959 Hypotension, unspecified: Secondary | ICD-10-CM

## 2014-09-07 MED ORDER — ALBUMIN HUMAN 25 % IV SOLN
25.0000 g | Freq: Once | INTRAVENOUS | Status: AC
  Administered 2014-09-07: 25 g via INTRAVENOUS
  Filled 2014-09-07: qty 100

## 2014-09-07 MED ORDER — NEPRO/CARBSTEADY PO LIQD: 237.0000 mL | ORAL | Status: DC | PRN

## 2014-09-07 MED ORDER — IPRATROPIUM-ALBUTEROL 0.5-2.5 (3) MG/3ML IN SOLN: 3.0000 mL | RESPIRATORY_TRACT | Status: DC | PRN

## 2014-09-07 MED ORDER — SODIUM CHLORIDE 0.9 % IV SOLN: 100.0000 mL | INTRAVENOUS | Status: DC | PRN

## 2014-09-07 MED ORDER — ALTEPLASE 2 MG IJ SOLR
2.0000 mg | Freq: Once | INTRAMUSCULAR | Status: DC | PRN
  Filled 2014-09-07: qty 2

## 2014-09-07 MED ORDER — HEPARIN SODIUM (PORCINE) 1000 UNIT/ML DIALYSIS
1000.0000 [IU] | INTRAMUSCULAR | Status: DC | PRN
  Administered 2014-09-07: 1000 [IU] via INTRAVENOUS_CENTRAL
  Filled 2014-09-07 (×3): qty 1

## 2014-09-07 NOTE — Progress Notes (Signed)
[facility] [place] NAME: [name]    MR#:  [id number]  DATE OF BIRTH:  08/22/1950  SUBJECTIVE: Has some shortness of breath, hypotension, critically ill.   CHIEF COMPLAINT:  No chief complaint on file.    REVIEW OF SYSTEMS:   Review of Systems  Constitutional: Negative for fever and chills.  HENT: Negative for hearing loss.   Eyes: Negative for blurred vision, double vision and photophobia.  Respiratory: Positive for cough. Negative for hemoptysis and shortness of breath.   Cardiovascular: Negative for chest pain, palpitations, orthopnea and leg swelling.  Gastrointestinal: Negative for nausea, vomiting, abdominal pain and diarrhea.  Genitourinary: Negative for dysuria and urgency.  Musculoskeletal: Negative for myalgias and neck pain.  Skin: Negative for rash.  Neurological: Negative for dizziness, focal weakness, seizures, weakness and headaches.  Psychiatric/Behavioral: Negative for memory loss. The patient does not have insomnia.    DRUG ALLERGIES:  No Known Allergies  VITALS:  Blood pressure 80/59, pulse 87, temperature 98.4 F (36.9 C), temperature source Oral, resp. rate 32, height 5\' 9"  (1.753 m), weight 97.6 kg (215 lb 2.7 oz), SpO2 99 %.  PHYSICAL EXAMINATION:  GENERAL:  64 y.o.-year-old patient sitting up, eating with assistance EYES: Pupils equal, round, reactive to light and accommodation. No scleral icterus. Extraocular muscles intact.  HEENT: Head atraumatic, normocephalic. Oropharynx and nasopharynx clear.  NECK:  Supple, no jugular venous distention. No thyroid enlargement, no tenderness.  LUNGS: Bibasilar crackles, short shallow respirations, no wheezes or rhonchi CARDIOVASCULAR: S1, S2 normal. No murmurs, rubs, or gallops.  ABDOMEN: Soft, nontender, nondistended. Bowel sounds present. No organomegaly or mass.  EXTREMITIES: +2 pedal edema, cyanosis, or clubbing.  NEUROLOGIC: Cranial nerves II  through XII are intact. Muscle strength 5/5 in all extremities. Sensation intact. Gait not checked.  PSYCHIATRIC: The patient is groggy but oriented x 3.  SKIN: No obvious rash, lesion, or ulcer.    LABORATORY PANEL:   CBC  Recent Labs Lab 09/06/14 1436  WBC 8.3  HGB 10.8*  HCT 32.9*  PLT 147*   ------------------------------------------------------------------------------------------------------------------  Chemistries   Recent Labs Lab 09/05/14 1309 09/06/14 1436  NA 143  143 141  K 3.2*  3.2* 3.2*  CL 99*  100* 99*  CO2 31  31 32  GLUCOSE 91  91 140*  BUN 24*  24* 19  CREATININE 6.03*  6.20* 5.35*  CALCIUM 9.1  9.1 8.8*  MG 2.1  --    ------------------------------------------------------------------------------------------------------------------  Cardiac Enzymes No results for input(s): TROPONINI in the last 168 hours. ------------------------------------------------------------------------------------------------------------------  RADIOLOGY:  Dg Chest 1 View  09/05/2014   CLINICAL DATA:  Cardiomyopathy.  Fluid retention.  EXAM: CHEST  1 VIEW  COMPARISON:  10/11/2013  FINDINGS: Moderate cardiac enlargement noted. There is a small left pleural effusion identified. Pulmonary vascular congestion is identified.  IMPRESSION: 1. Cardiac enlargement. 2. Left pleural effusion and pulmonary vascular congestion.   Electronically Signed   By: Kerby Moors M.D.   On: 09/05/2014 14:40    EKG:   Orders placed or performed during the hospital encounter of 08/27/14  . EKG 12-Lead  . EKG 12-Lead    ASSESSMENT AND PLAN:   Acute respiratory failure with hypoxia: This is due to interstitial volume overload, likely occurred during surgery to place a PermCath. He has been admitted to the stepdown unit, no longer needing a nonrebreather mask is just on nasal cannula.  Overall respiratory failure is improving.    End-stage  renal disease on hemodialysis:   Hemodialysis last 2 days per pulmonary edema peripheral edema. Unable to tolerate large a ultrafiltration secondary to hypotension. Renal is following.  Hypotension: Chronic, likely due to severe nonischemic cardiomyopathy with ejection fraction of 20-25%. Agree with increasing Midrin to 10 mg 3 times a day  Nonischemic cardiomyopathy, acute on chronic systolic congestive heart failure, nonobstructive coronary artery disease: Appreciate cardiology following. Continue to manage volume with hemodialysis  Anemia of chronic disease: Stable. No signs of bleeding  All the records are reviewed and case discussed with Care Management/Social Workerr. Management plans discussed with the patient, family and they are in agreement.  CODE STATUS: Full  TOTAL TIME TAKING CARE OF THIS PATIENT: 30 minutes.  Greater than 50% of time spent in care coordination and counseling. POSSIBLE D/C IN 1-2 DAYS, DEPENDING ON CLINICAL CONDITION.   Epifanio Lesches M.D on 09/07/2014 at 11:10 AM  Between 7am to 6pm - Pager - [phone]  After 6pm go to [email or web address] - password [email or web address] [facility]  Office  [phone]  CC: Primary care physician; Estanislado Emms, MD

## 2014-09-07 NOTE — Progress Notes (Signed)
POST   09/07/14 1845  Vital Signs  Temp 98.6 F (37 C)  Temp Source Oral  Pulse Rate (!) 108  Pulse Rate Source Monitor  Resp (!) 34  BP (!) 114/47 mmHg  BP Location Left Leg  BP Method Automatic  Patient Position (if appropriate) Lying  Dialysis Weight  Weight 94.3 kg (207 lb 14.3 oz)  Type of Weight Post-Dialysis  During Hemodialysis Assessment  Intra-Hemodialysis Comments HD TREATMENT ENDED GOAL MET.  BLOOD RETURNED AND CVC ACCESSED PER POLICY.  PT ALERT AND VS IMPROLVED.     Post-Hemodialysis Assessment  Rinseback Volume (mL) 250 mL  Dialyzer Clearance Lightly streaked  Duration of HD Treatment -hour(s) 3 hour(s)  Hemodialysis Intake (mL) 500 mL  UF Total -Machine (mL) 2000 mL  Net UF (mL) 1500 mL  Tolerated HD Treatment Yes  Post-Hemodialysis Comments HD TREATMENT ENDED GOAL MET.  BLOOD RETURNED AND CVC ACCESSED PER POLICY.  PT ALERT AND VS IMPROLVED.     Education / Care Plan  Hemodialysis Education Provided Yes  Documented Education in Clinical Pathway Yes  Hemodialysis Catheter Right Femoral vein Double-lumen  Placement Date/Time: 09/05/14 1100   Placed prior to admission: Yes  Person Inserting Catheter: Dr. Lucky Cowboy  Orientation: Right  Access Location: Femoral vein  Hemodialysis Catheter Type: Double-lumen  Site Condition No complications  Blue Lumen Status Heparin locked  Red Lumen Status Heparin locked  Purple Lumen Status N/A  Catheter fill solution Heparin 1000 units/ml  Catheter fill volume (Arterial) 2.5 cc  Catheter fill volume (Venous) 2.5  Dressing Type Gauze/Drain sponge  Dressing Status Clean;Dry;Intact  Drainage Description None  Dressing Change Due 09/09/14  Post treatment catheter status Capped and Clamped   HD

## 2014-09-07 NOTE — Progress Notes (Signed)
PRE HD   09/07/14 1500  Vital Signs  Temp 98.6 F (37 C)  Temp Source Oral  Pulse Rate 91  Pulse Rate Source Monitor  Resp (!) 32  BP (!) 89/60 mmHg  BP Location Left Arm  Oxygen Therapy  SpO2 100 %  O2 Device Nasal Cannula  Pain Assessment  Pain Assessment 0-10  Pain Score Asleep  Dialysis Weight  Weight 98 kg (216 lb 0.8 oz)  Type of Weight Pre-Dialysis  Time-Out for Hemodialysis  What Procedure? HEMODIALYSIS  Pt Identifiers(min of two) First/Last Name;MRN/Account#  Correct Site? Yes  Correct Side? Yes  Correct Procedure? Yes  Consents Verified? Yes  Rad Studies Available? N/A  Safety Precautions Reviewed? Yes  Machine Checks  Machine Number [id number]  UF/Alarm Test Passed  Conductivity: Meter 13.7  Conductivity: Machine  13.6  pH 7.4  Dialyzer Lot Number [id number]  Disposable Set Lot Number [id number]  Machine Temperature 98.6 F (37 C)  Musician and Audible Yes  Blood Lines Intact and Secured Yes  Pre Treatment Patient Checks  Vascular access used during treatment Catheter  Hepatitis B Surface Antigen Results Negative  Date Hepatitis B Surface Antigen Drawn 09/05/14  Hemodialysis Consent Verified Yes  Hemodialysis Standing Orders Initiated Yes  ECG (Telemetry) Monitor On Yes  Prime Ordered Normal Saline  Length of  DialysisTreatment -hour(s) 3 Hour(s)  Dialysis Treatment Comments GOAL TO REMOCE 1.5L  OVER 3 HOURS.  CVC ACCESED WITHOUT DIFFICULTY. PT SLEEPING BUT RESPONDS TO VOICE. BP LOW BUT ALBUMIN ORDERED TO PROVIDE SUPPORT.   Dialyzer Optiflux 180 NR  Dialysate (3K2.5CA)  Dialysis Anticoagulant None  Dialysate Flow Ordered 600  Blood Flow Rate Ordered 400 mL/min  Ultrafiltration Goal 1.5 Liters  Pre Treatment Labs Renal panel;CBC;Phosphorus  Dialysis Blood Pressure Support Ordered Normal Saline

## 2014-09-07 NOTE — Progress Notes (Signed)
PRE HD   09/07/14 1500  Neurological  Level of Consciousness Alert  Orientation Level Disoriented to situation;Disoriented to time  Respiratory  Respiratory Pattern Shallow  Chest Assessment Chest expansion symmetrical  Bilateral Breath Sounds Coarse crackles  Cough Non-productive  Cardiac  Pulse Regular  ECG Monitor Yes  Cardiac Rhythm NSR  Antiarrhythmic device No  Vascular  Edema Generalized;Right upper extremity;Right lower extremity;Left upper extremity  Generalized Edema +1  RUE Edema +4  LUE Edema +2  RLE Edema +4  LLE Edema None  Integumentary  Integumentary (WDL) WDL  Musculoskeletal  Musculoskeletal (WDL) X  Generalized Weakness Yes  Gastrointestinal  Bowel Sounds Assessment Active  GU Assessment  Genitourinary (WDL) X  Genitourinary Symptoms Other (Comment) (HD)  Psychosocial  Psychosocial (WDL) X  Patient Behaviors Cooperative;Flat affect  Needs Expressed Denies  Emotional support given Given to patient

## 2014-09-07 NOTE — Progress Notes (Signed)
PROGRESS NOTE  Subjective:   64 yo with hx of ESRD, chronic systolic CHF with EF 03%, moderate pulmonary HTN, hypotension who was admitted with respiratory failure following porta-a-cath insertion.  Repeat echo has been ordered.   Objective:    Vital Signs:   Temp:  [97.6 F (36.4 C)-98.4 F (36.9 C)] 98.4 F (36.9 C) (08/13 0400) Pulse Rate:  [43-101] 82 (08/13 1100) Resp:  [20-40] 40 (08/13 1100) BP: (67-93)/(33-68) 92/62 mmHg (08/13 1100) SpO2:  [94 %-100 %] 94 % (08/13 1100) Weight:  [97.6 kg (215 lb 2.7 oz)-98.9 kg (218 lb 0.6 oz)] 97.6 kg (215 lb 2.7 oz) (08/12 1630)      24-hour weight change: Weight change: -2.252 kg (-4 lb 15.4 oz)  Weight trends: Filed Weights   09/05/14 1445 09/06/14 1300 09/06/14 1630  Weight: 99 kg (218 lb 4.1 oz) 98.9 kg (218 lb 0.6 oz) 97.6 kg (215 lb 2.7 oz)    Intake/Output:  08/12 0701 - 08/13 0700 In: 360 [P.O.:360] Out: 1000  Total I/O In: 120 [P.O.:120] Out: -    Physical Exam: BP 92/62 mmHg  Pulse 82  Temp(Src) 98.4 F (36.9 C) (Oral)  Resp 40  Ht 5\' 9"  (1.753 m)  Wt 97.6 kg (215 lb 2.7 oz)  BMI 31.76 kg/m2  SpO2 94%  Wt Readings from Last 3 Encounters:  09/06/14 97.6 kg (215 lb 2.7 oz)  08/27/14 101.152 kg (223 lb)  02/14/14 107.956 kg (238 lb)    General: Chronically ill appearing   Head: Normocephalic, atraumatic.  Eyes: conjunctivae/corneas clear.  EOM's intact.   Throat: normal  Neck:  thick , difficult to assess JVD  Lungs:    moaning, difficult to hear breath sounds   Heart:  RR   Abdomen:  Soft, non-tender, non-distended    Extremities: Clotted dialysis fistula in right upper arm .    Neurologic: A&O X3, CN II - XII are grossly intact.   Psych: Normal     Labs: BMET:  Recent Labs  09/05/14 1309 09/06/14 1436  NA 143  143 141  K 3.2*  3.2* 3.2*  CL 99*  100* 99*  CO2 31  31 32  GLUCOSE 91  91 140*  BUN 24*  24* 19  CREATININE 6.03*  6.20* 5.35*  CALCIUM 9.1  9.1 8.8*    MG 2.1  --   PHOS 4.8*  --     Liver function tests:  Recent Labs  09/05/14 1309  ALBUMIN 3.4*   No results for input(s): LIPASE, AMYLASE in the last 72 hours.  CBC:  Recent Labs  09/05/14 1309 09/06/14 1436  WBC 4.8 8.3  HGB 11.3* 10.8*  HCT 35.1* 32.9*  MCV 94.8 94.4  PLT 150 147*    Cardiac Enzymes: No results for input(s): CKTOTAL, CKMB, TROPONINI in the last 72 hours.  Coagulation Studies: No results for input(s): LABPROT, INR in the last 72 hours.  Other: Invalid input(s): POCBNP No results for input(s): DDIMER in the last 72 hours. No results for input(s): HGBA1C in the last 72 hours. No results for input(s): CHOL, HDL, LDLCALC, TRIG, CHOLHDL in the last 72 hours. No results for input(s): TSH, T4TOTAL, T3FREE, THYROIDAB in the last 72 hours.  Invalid input(s): FREET3 No results for input(s): VITAMINB12, FOLATE, FERRITIN, TIBC, IRON, RETICCTPCT in the last 72 hours.   Other results:  EKG  ( personally reviewed )  - tele:  Sinus tach   Medications:    Infusions:  Scheduled Medications: . albumin human  25 g Intravenous Once  . aspirin EC  81 mg Oral Daily  . cinacalcet  30 mg Oral Q breakfast  . epoetin (EPOGEN/PROCRIT) injection  4,000 Units Intravenous Q M,W,F-HD  . feeding supplement (NEPRO CARB STEADY)  237 mL Oral Daily  . heparin subcutaneous  5,000 Units Subcutaneous 3 times per day  . ipratropium-albuterol  3 mL Nebulization Q4H  . midodrine  10 mg Oral TID WC    Assessment/ Plan:   Active Problems:   Fluid overload   Acute respiratory failure with hypoxia   Malnutrition of moderate degree   Acute systolic CHF (congestive heart failure)   End stage renal disease   Hemodialysis patient  1. Respiratory failure:  Multifactorial.  He has chronic systolic CHF and also ESRD. His CHF is complicated by hypotension and he is not abe to start any CHF meds.  Continue dialysis for volume removal Repeat echo has been ordered and will be  done today.  2. Hypotension:  This is a poor prognostic sign in the setting of severe LV dysfunction and ESRD.  He has had a right heart cath and has high filling pressures even in the setting of hypotension. Midodrine is a short term solution but not optimal in the setting of chronic systolic CHF - There are no good long term solutions to this   Following    Disposition:  Length of Stay: 2  Thayer Headings, Brooke Bonito., MD, [facility] 09/07/2014, 11:21 AM Office [phone] Pager [phone]

## 2014-09-07 NOTE — Progress Notes (Signed)
Subjective:   Hemodialysis last two days. UF total of 2 litres. Continues to have shortness of breath. 2 Litres [place] Hypotensive. Critically ill.   Objective:  Vital signs in last 24 hours:  Temp:  [97.6 F (36.4 C)-98.4 F (36.9 C)] 98.4 F (36.9 C) (08/13 0400) Pulse Rate:  [43-101] 80 (08/13 0700) Resp:  [20-43] 31 (08/13 0700) BP: (67-93)/(33-68) 70/53 mmHg (08/13 0700) SpO2:  [99 %-100 %] 100 % (08/13 0700) Weight:  [97.6 kg (215 lb 2.7 oz)-98.9 kg (218 lb 0.6 oz)] 97.6 kg (215 lb 2.7 oz) (08/12 1630)  Weight change: -2.252 kg (-4 lb 15.4 oz) Filed Weights   09/05/14 1445 09/06/14 1300 09/06/14 1630  Weight: 99 kg (218 lb 4.1 oz) 98.9 kg (218 lb 0.6 oz) 97.6 kg (215 lb 2.7 oz)    Intake/Output:    Intake/Output Summary (Last 24 hours) at 09/07/14 0839 Last data filed at 09/06/14 1800  Gross per 24 hour  Intake    240 ml  Output   1000 ml  Net   -760 ml     Physical Exam: General: Critically ill appearing  HEENT Moist mucus membranes  Neck supple  Pulm/lungs Bilateral crackles  CVS/Heart Regular rate, rhythm  Abdomen:  Soft, non tender  Extremities: Right arm 2+ edema, 1+ edema in all other extremities.   Neurologic: Alert, speech hard to understand, follows simple commands  Skin: No acute lesions  Access:  Right Femoral permcath, clotted right arm AVG, right forearm AVF, left arm AVG, left forearm AVG       Basic Metabolic Panel:   Recent Labs Lab 09/05/14 1309 09/06/14 1436  NA 143  143 141  K 3.2*  3.2* 3.2*  CL 99*  100* 99*  CO2 31  31 32  GLUCOSE 91  91 140*  BUN 24*  24* 19  CREATININE 6.03*  6.20* 5.35*  CALCIUM 9.1  9.1 8.8*  MG 2.1  --   PHOS 4.8*  --      CBC:  Recent Labs Lab 09/05/14 1309 09/06/14 1436  WBC 4.8 8.3  HGB 11.3* 10.8*  HCT 35.1* 32.9*  MCV 94.8 94.4  PLT 150 147*      Microbiology:  Recent Results (from the past 720 hour(s))  MRSA PCR Screening     Status: None   Collection Time: 09/05/14   1:08 PM  Result Value Ref Range Status   MRSA by PCR NEGATIVE NEGATIVE Final    Comment:        The GeneXpert MRSA Assay (FDA approved for NASAL specimens only), is one component of a comprehensive MRSA colonization surveillance program. It is not intended to diagnose MRSA infection nor to guide or monitor treatment for MRSA infections.   Culture, blood (routine x 2)     Status: None (Preliminary result)   Collection Time: 09/05/14  1:09 PM  Result Value Ref Range Status   Specimen Description BLOOD UPPER LEFT ARM  Final   Special Requests BOTTLES DRAWN AEROBIC AND ANAEROBIC AER7ML ANA10ML  Final   Culture NO GROWTH < 24 HOURS  Final   Report Status PENDING  Incomplete  Culture, blood (routine x 2)     Status: None (Preliminary result)   Collection Time: 09/05/14  1:23 PM  Result Value Ref Range Status   Specimen Description BLOOD LEFT ANTECUBITAL  Final   Special Requests BOTTLES DRAWN AEROBIC AND ANAEROBIC 10ML  Final   Culture NO GROWTH < 24 HOURS  Final   Report  Status PENDING  Incomplete    Coagulation Studies: No results for input(s): LABPROT, INR in the last 72 hours.  Urinalysis: No results for input(s): COLORURINE, LABSPEC, PHURINE, GLUCOSEU, HGBUR, BILIRUBINUR, KETONESUR, PROTEINUR, UROBILINOGEN, NITRITE, LEUKOCYTESUR in the last 72 hours.  Invalid input(s): APPERANCEUR    Imaging: Dg Chest 1 View  09/05/2014   CLINICAL DATA:  Cardiomyopathy.  Fluid retention.  EXAM: CHEST  1 VIEW  COMPARISON:  10/11/2013  FINDINGS: Moderate cardiac enlargement noted. There is a small left pleural effusion identified. Pulmonary vascular congestion is identified.  IMPRESSION: 1. Cardiac enlargement. 2. Left pleural effusion and pulmonary vascular congestion.   Electronically Signed   By: Kerby Moors M.D.   On: 09/05/2014 14:40     Medications:     . aspirin EC  81 mg Oral Daily  . cinacalcet  30 mg Oral Q breakfast  . epoetin (EPOGEN/PROCRIT) injection  4,000 Units  Intravenous Q M,W,F-HD  . feeding supplement (NEPRO CARB STEADY)  237 mL Oral Daily  . heparin subcutaneous  5,000 Units Subcutaneous 3 times per day  . ipratropium-albuterol  3 mL Nebulization Q4H  . midodrine  10 mg Oral TID WC     Assessment/ Plan:  64 y.o.black  male male with end-stage renal disease on hemodiaysis, chronic systolic congestive heart failurith EF 20-25% by 2-D echo in December 2015, was admitted on 09/05/2014 for PermCath placement and shortness of breath post procedure  MWF [facility] [place] [facility]  1. End-stage renal disease: hemodialysis for last two days for pulmonary edema, peripheral edema. Unable to tolerate large ultrafiltration due to hypotension.  - plan on hemodialysis again today. Will provide IV albumin to assist with ultrafiltration.  - monitor daily for dialysis need.   2. Hypotension: with congestive heart failure, peripheral vascular disease and multiple failed dialysis accesses. Unclear what patient's actual blood pressure is. Currently asymptomatic.  - continue midodine  - will start pseudophedrine today - IV albumin with HD treatment. - There is still the option of using L-carnitine   3. Anemia of chronic kidney disease: hemoglobin 10.8 - Low dose epo ordered MWF on HD treatment.   4.Secondary hyperparathyroidism: phos 4.8  - Continue cinacalcet - Not currently on a phos binder  5. Systolic Congestive Heart Failure: in acute exacerbation with pulmonary edema and peripheral edema. UF with extra HD treatment today.  - check echocardiogram - check dopplers in all four extremities looking for DVT   LOS: [place], [place] 8/13/20168:39 AM

## 2014-09-07 NOTE — Progress Notes (Signed)
*  PRELIMINARY RESULTS* Echocardiogram 2D Echocardiogram has been performed.  [name] 09/07/2014, 1:34 PM

## 2014-09-07 NOTE — Progress Notes (Signed)
HD END 

## 2014-09-07 NOTE — Progress Notes (Signed)
Patient is lethargic at times and disoriented to time and situation. Intermittent garbled speech. Unequal upper and lower extremity edema, MD aware and ultrasounds ordered. Upper extremity ultrasound completed today, LE pending. Dialysis completed today. BP marginal, improved with dialysis. Rested quietly between care.

## 2014-09-07 NOTE — Progress Notes (Signed)
[facility]  Daily Progress Note   Subjective: 2 Days Post-Op:  Placement of a 44cm tip to cuff tunneled hemodialysis catheter via the right femoral vein.  Patient without complaint. No issues with dialysis.  Objective: Filed Vitals:   09/07/14 0837 09/07/14 0900 09/07/14 1000 09/07/14 1100  BP:  80/59 77/58 92/62   Pulse:  87 89 82  Temp:      TempSrc:      Resp:  32 28 40  Height:      Weight:      SpO2: 98% 99% 97% 94%    Intake/Output Summary (Last 24 hours) at 09/07/14 1257 Last data filed at 09/07/14 0900  Gross per 24 hour  Intake    360 ml  Output   1000 ml  Net   -640 ml    Physical Exam: NAD CV: RRR Pulm: decreased Vascular:   Right Groin: catheter intact, no swelling or infection noted.    Laboratory: CBC    Component Value Date/Time   WBC 8.3 09/06/2014 1436   HGB 10.8* 09/06/2014 1436   HCT 32.9* 09/06/2014 1436   PLT 147* 09/06/2014 1436    BMET    Component Value Date/Time   NA 141 09/06/2014 1436   K 3.2* 09/06/2014 1436   K 3.9 10/19/2013 1408   CL 99* 09/06/2014 1436   CO2 32 09/06/2014 1436   GLUCOSE 140* 09/06/2014 1436   BUN 19 09/06/2014 1436   CREATININE 5.35* 09/06/2014 1436   CALCIUM 8.8* 09/06/2014 1436   GFRNONAA 10* 09/06/2014 1436   GFRAA 12* 09/06/2014 1436    Assessment/Planning: 64 year old male 2 Days Post-Op: Placement of a 44cm tip to cuff tunneled hemodialysis catheter via the right femoral vein. Catheter working well. No issues with hemodialysis. Care as per primary team.  Marcelle Overlie PA-C 09/07/2014 12:57 PM

## 2014-09-08 ENCOUNTER — Inpatient Hospital Stay: Payer: Medicare Other

## 2014-09-08 NOTE — Progress Notes (Signed)
PROGRESS NOTE  Subjective:   64 yo with hx of ESRD, chronic systolic CHF with EF 30%, moderate pulmonary HTN, hypotension who was admitted with respiratory failure following porta-a-cath insertion.  Echo :  Severe LV dysfunction with EF of 15-20%  Severe MR  Mild ( perhaps mild - moderate ) Aortic stenosis     Objective:    Vital Signs:   Temp:  [97.6 F (36.4 C)-98.6 F (37 C)] 98.6 F (37 C) (08/14 0759) Pulse Rate:  [44-141] 95 (08/14 0800) Resp:  [22-49] 30 (08/14 0800) BP: (60-144)/(37-124) 123/57 mmHg (08/14 0800) SpO2:  [86 %-100 %] 99 % (08/14 0800) Weight:  [94.3 kg (207 lb 14.3 oz)-98 kg (216 lb 0.8 oz)] 94.3 kg (207 lb 14.3 oz) (08/13 1845)      24-hour weight change: Weight change: -0.9 kg (-1 lb 15.8 oz)  Weight trends: Filed Weights   09/06/14 1630 09/07/14 1500 09/07/14 1845  Weight: 97.6 kg (215 lb 2.7 oz) 98 kg (216 lb 0.8 oz) 94.3 kg (207 lb 14.3 oz)    Intake/Output:  08/13 0701 - 08/14 0700 In: 357 [P.O.:357] Out: 1500  Total I/O In: 60 [P.O.:60] Out: -    Physical Exam: BP 123/57 mmHg  Pulse 95  Temp(Src) 98.6 F (37 C) (Oral)  Resp 30  Ht 5\' 9"  (1.753 m)  Wt 94.3 kg (207 lb 14.3 oz)  BMI 30.69 kg/m2  SpO2 99%  Wt Readings from Last 3 Encounters:  09/07/14 94.3 kg (207 lb 14.3 oz)  08/27/14 101.152 kg (223 lb)  02/14/14 107.956 kg (238 lb)    General: Chronically ill appearing   Head: Normocephalic, atraumatic.  Eyes: conjunctivae/corneas clear.  EOM's intact.   Throat: normal  Neck:  thick , difficult to assess JVD  Lungs:    moaning, difficult to hear breath sounds   Heart:  RR   Abdomen:  Soft, non-tender, non-distended    Extremities: Clotted dialysis fistula in right upper arm .    Neurologic: A&O X3, CN II - XII are grossly intact.   Psych: Normal     Labs: BMET:  Recent Labs  09/05/14 1309 09/06/14 1436  NA 143  143 141  K 3.2*  3.2* 3.2*  CL 99*  100* 99*  CO2 31  31 32  GLUCOSE 91  91  140*  BUN 24*  24* 19  CREATININE 6.03*  6.20* 5.35*  CALCIUM 9.1  9.1 8.8*  MG 2.1  --   PHOS 4.8*  --     Liver function tests:  Recent Labs  09/05/14 1309  ALBUMIN 3.4*   No results for input(s): LIPASE, AMYLASE in the last 72 hours.  CBC:  Recent Labs  09/05/14 1309 09/06/14 1436  WBC 4.8 8.3  HGB 11.3* 10.8*  HCT 35.1* 32.9*  MCV 94.8 94.4  PLT 150 147*    Cardiac Enzymes: No results for input(s): CKTOTAL, CKMB, TROPONINI in the last 72 hours.  Coagulation Studies: No results for input(s): LABPROT, INR in the last 72 hours.  Other: Invalid input(s): POCBNP No results for input(s): DDIMER in the last 72 hours. No results for input(s): HGBA1C in the last 72 hours. No results for input(s): CHOL, HDL, LDLCALC, TRIG, CHOLHDL in the last 72 hours. No results for input(s): TSH, T4TOTAL, T3FREE, THYROIDAB in the last 72 hours.  Invalid input(s): FREET3 No results for input(s): VITAMINB12, FOLATE, FERRITIN, TIBC, IRON, RETICCTPCT in the last 72 hours.   Other results:  EKG  ( personally reviewed )  - tele:  Sinus tach   Medications:    Infusions:    Scheduled Medications: . aspirin EC  81 mg Oral Daily  . cinacalcet  30 mg Oral Q breakfast  . epoetin (EPOGEN/PROCRIT) injection  4,000 Units Intravenous Q M,W,F-HD  . feeding supplement (NEPRO CARB STEADY)  237 mL Oral Daily  . heparin subcutaneous  5,000 Units Subcutaneous 3 times per day  . midodrine  10 mg Oral TID WC    Assessment/ Plan:   Active Problems:   Fluid overload   Acute respiratory failure with hypoxia   Malnutrition of moderate degree   Acute systolic CHF (congestive heart failure)   End stage renal disease   Hemodialysis patient  1. Respiratory failure:  Multifactorial.  He has chronic systolic CHF and also ESRD. He is doing much better after dialyzing several more liters of fluid .    2. Hypotension:  He is doing much better at this point .  Continue midodrine     Following    Disposition:  Length of Stay: 3  Thayer Headings, Brooke Bonito., MD, [facility] 09/08/2014, 12:04 PM Office [phone] Pager [phone]

## 2014-09-08 NOTE — Progress Notes (Signed)
Pt and daughter agree he is doing better today. Alert. Confused to day of week and time. Speech is a little slurred/ thick and difficult to understand. His daughter states this is his normal speech.  Has periods of tachypnea that his daughter and pt say are also his normal. Lungs with few coarse crackles. Sats good on room air.  Productive cough of thin clear /white secretions. Denies pain.Right arm edematous and elevated on 2-3 pillows.  BP improved on midodrine. Up in chair much of day .  Appetite fairly good. Many visitors in to see.  Dialysis orders in for tomorrow.

## 2014-09-08 NOTE — Progress Notes (Signed)
[place] [place] NAME: [name]    MR#:  [id number]  DATE OF BIRTH:  [birth year]  SUBJECTIVE: :over all  improved today. Sitting in the chair. No shortness of breath, no cough. Daughter at the bedside   CHIEF COMPLAINT:  No chief complaint on file.    REVIEW OF SYSTEMS:   Review of Systems  Constitutional: Negative for fever and chills.  HENT: Negative for hearing loss.   Eyes: Negative for blurred vision, double vision and photophobia.  Respiratory: Positive for cough. Negative for hemoptysis and shortness of breath.   Cardiovascular: Negative for chest pain, palpitations, orthopnea and leg swelling.  Gastrointestinal: Negative for nausea, vomiting, abdominal pain and diarrhea.  Genitourinary: Negative for dysuria and urgency.  Musculoskeletal: Negative for myalgias and neck pain.  Skin: Negative for rash.  Neurological: Negative for dizziness, focal weakness, seizures, weakness and headaches.  Psychiatric/Behavioral: Negative for memory loss. The patient does not have insomnia.    DRUG ALLERGIES:  No Known Allergies  VITALS:  Blood pressure 123/57, pulse 95, temperature 98.6 F (37 C), temperature source Oral, resp. rate 30, height 5\' 9"  (1.753 m), weight 94.3 kg (207 lb 14.3 oz), SpO2 99 %.  PHYSICAL EXAMINATION:  GENERAL:  64-year-old patient sitting up, eating with assistance EYES: Pupils equal, round, reactive to light and accommodation. No scleral icterus. Extraocular muscles intact.  HEENT: Head atraumatic, normocephalic. Oropharynx and nasopharynx clear.  NECK:  Supple, no jugular venous distention. No thyroid enlargement, no tenderness.  LUNGS: Clear to auscultation.  CARDIOVASCULAR: S1, S2 normal. No murmurs, rubs, or gallops.  ABDOMEN: Soft, nontender, nondistended. Bowel sounds present. No organomegaly or mass.  EXTREMITIES: +2 pedal edema, cyanosis, or clubbing.  NEUROLOGIC: Cranial nerves II  through XII are intact. Muscle strength 5/5 in all extremities. Sensation intact. Gait not checked.  PSYCHIATRIC: The patient is groggy but oriented x 3.  SKIN: No obvious rash, lesion, or ulcer.    LABORATORY PANEL:   CBC  Recent Labs Lab 09/06/14 1436  WBC 8.3  HGB 10.8*  HCT 32.9*  PLT 147*   ------------------------------------------------------------------------------------------------------------------  Chemistries   Recent Labs Lab 09/05/14 1309 09/06/14 1436  NA 143  143 141  K 3.2*  3.2* 3.2*  CL 99*  100* 99*  CO2 31  31 32  GLUCOSE 91  91 140*  BUN 24*  24* 19  CREATININE 6.03*  6.20* 5.35*  CALCIUM 9.1  9.1 8.8*  MG 2.1  --    ------------------------------------------------------------------------------------------------------------------  Cardiac Enzymes No results for input(s): TROPONINI in the last 168 hours. ------------------------------------------------------------------------------------------------------------------  RADIOLOGY:  US Venous Img Lower Bilateral  09/08/2014   CLINICAL DATA:  Bilateral lower extremity edema  EXAM: BILATERAL LOWER EXTREMITY VENOUS DOPPLER ULTRASOUND  TECHNIQUE: Gray-scale sonography with graded compression, as well as color Doppler and duplex ultrasound were performed to evaluate the lower extremity deep venous systems from the level of the common femoral vein and including the common femoral, femoral, profunda femoral, popliteal and calf veins including the posterior tibial, peroneal and gastrocnemius veins when visible. The superficial great saphenous vein was also interrogated. Spectral Doppler was utilized to evaluate flow at rest and with distal augmentation maneuvers in the common femoral, femoral and popliteal veins.  COMPARISON:  None.  FINDINGS: RIGHT LOWER EXTREMITY  Common Femoral Vein: No evidence of thrombus. Normal compressibility, respiratory phasicity and response to augmentation.  Saphenofemoral  Junction: No evidence of thrombus. Normal compressibility and flow on color  Doppler imaging.  Profunda Femoral Vein: No evidence of thrombus. Normal compressibility and flow on color Doppler imaging.  Femoral Vein: No evidence of thrombus. Normal compressibility, respiratory phasicity and response to augmentation.  Popliteal Vein: No evidence of thrombus. Normal compressibility, respiratory phasicity and response to augmentation.  Calf Veins: No evidence of thrombus. Normal compressibility and flow on color Doppler imaging.  Superficial Great Saphenous Vein: No evidence of thrombus. Normal compressibility and flow on color Doppler imaging.  Venous Reflux:  None.  Other Findings:  None.  LEFT LOWER EXTREMITY  Common Femoral Vein: No evidence of thrombus. Normal compressibility, respiratory phasicity and response to augmentation.  Saphenofemoral Junction: No evidence of thrombus. Normal compressibility and flow on color Doppler imaging.  Profunda Femoral Vein: No evidence of thrombus. Normal compressibility and flow on color Doppler imaging.  Femoral Vein: No evidence of thrombus. Normal compressibility, respiratory phasicity and response to augmentation.  Popliteal Vein: No evidence of thrombus. Normal compressibility, respiratory phasicity and response to augmentation.  Calf Veins: No evidence of thrombus. Normal compressibility and flow on color Doppler imaging.  Superficial Great Saphenous Vein: No evidence of thrombus. Normal compressibility and flow on color Doppler imaging.  Venous Reflux:  None.  Other Findings:  None.  IMPRESSION: No evidence of deep venous thrombosis.  Right thigh dialysis graft is patent.   Electronically Signed   By: Inez Catalina M.D.   On: 09/08/2014 13:46   US Venous Img Upper Bilat  09/07/2014   CLINICAL DATA:  Upper extremity edema  EXAM: BILATERAL UPPER EXTREMITY VENOUS DOPPLER ULTRASOUND  TECHNIQUE: Gray-scale sonography with graded compression, as well as color Doppler and duplex  ultrasound were performed to evaluate the bilateral upper extremity deep venous systems from the level of the subclavian vein and including the jugular, axillary, basilic, radial, ulnar and upper cephalic vein. Spectral Doppler was utilized to evaluate flow at rest and with distal augmentation maneuvers.  COMPARISON:  11/01/2013  FINDINGS: RIGHT UPPER EXTREMITY  Internal Jugular Vein: No evidence of thrombus. Normal compressibility, respiratory phasicity and response to augmentation.  Subclavian Vein: No evidence of thrombus. Normal compressibility, respiratory phasicity and response to augmentation.  Axillary Vein: No evidence of thrombus. Normal compressibility, respiratory phasicity and response to augmentation.  Cephalic Vein: No evidence of thrombus. Normal compressibility, respiratory phasicity and response to augmentation.  Basilic Vein: No evidence of thrombus. Normal compressibility, respiratory phasicity and response to augmentation.  Brachial Veins: No evidence of thrombus. Normal compressibility, respiratory phasicity and response to augmentation.  Radial Veins: No evidence of thrombus. Normal compressibility, respiratory phasicity and response to augmentation.  Ulnar Veins: No evidence of thrombus. Normal compressibility, respiratory phasicity and response to augmentation.  Venous Reflux:  None.  Other Findings: Multiple occluded dialysis grafts are noted within the right arm. Significant subcutaneous edema is noted in the region of the elbow and forearm. 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 Its proximal portion is patent although to in the forearm there is a considerable amount of dilatation and mural thrombus identified. The outflow vein is follow-through a venous stent which appears patent. The runoff beyond the stent however demonstrates occlusion similar to that seen on prior fistulogram dated 11/01/2013.  LEFT UPPER EXTREMITY  Internal Jugular Vein: No evidence of thrombus.  Normal compressibility, respiratory phasicity and response to augmentation.  Subclavian Vein: No evidence of thrombus. Normal compressibility, respiratory phasicity and response to augmentation.  Axillary Vein: No evidence of thrombus. Normal compressibility, respiratory phasicity and response to augmentation.  Cephalic Vein:  No evidence of thrombus. Normal compressibility, respiratory phasicity and response to augmentation.  Basilic Vein: No evidence of thrombus. Normal compressibility, respiratory phasicity and response to augmentation.  Brachial Veins: No evidence of thrombus. Normal compressibility, respiratory phasicity and response to augmentation.  Radial Veins: No evidence of thrombus. Normal compressibility, respiratory phasicity and response to augmentation.  Ulnar Veins: No evidence of thrombus. Normal compressibility, respiratory phasicity and response to augmentation.  Venous Reflux:  None.  Other Findings:  None.  IMPRESSION: No evidence of deep venous thrombosis.  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 The occlusion occurs central to the previously placed stent.   Electronically Signed   By: Inez Catalina M.D.   On: 09/07/2014 19:45    EKG:   Orders placed or performed during the hospital encounter of 08/27/14  . EKG 12-Lead  . EKG 12-Lead    ASSESSMENT AND PLAN:   Acute respiratory failure with hypoxia: This is due to interstitial volume overload, likely occurred during surgery to place a PermCath. He has been admitted to the stepdown unit, no longer needing a nonrebreather mask is just on nasal cannula.  Overall respiratory failure is improving.    End-stage renal disease on hemodialysis:  Hemodialysis last 2 days per pulmonary edema peripheral edema. Unable to tolerate large a ultrafiltration secondary to hypotension. Renal is following. Patient feels better now and her x-ray tomorrow. Moderate Malnutrition; use all  men with dialysis.  Hypotension: Chronic, likely due to severe nonischemic cardiomyopathy with ejection fraction of 20-25%. Agree with increasing Midrin to 10 mg 3 times a day  Nonischemic cardiomyopathy, acute on chronic systolic congestive heart failure, nonobstructive coronary artery disease: Appreciate cardiology following. Continue to manage volume with hemodialysis  Respiratory  failure improving with the dialysis. Suspect sleep  Apnea.  Anemia of chronic disease: Stable. No signs of bleeding  Deconditioning physical therapy is following.  D/w daughter . All the records are reviewed and case discussed with Care Management/Social Workerr. Management plans discussed with the patient, family and they are in agreement.  CODE STATUS: Full  TOTAL TIME TAKING CARE OF THIS PATIENT: 30 minutes.  . POSSIBLE D/C IN 1-2 DAYS, DEPENDING ON CLINICAL CONDITION.   Epifanio Lesches M.D on 09/08/2014 at 2:01 PM  Between 7am to 6pm - Pager - [phone]  After 6pm go to [email or web address] - password [email or web address] [facility]  Office  [phone]  CC: Primary care physician; Estanislado Emms, MD

## 2014-09-08 NOTE — Progress Notes (Signed)
Subjective:   Hemodialysis last three days. UF total of 3.8 litres. Patient now breathing room air.  Blood pressure improved.  Patient sitting in chair. Daughter at bedside.   Objective:  Vital signs in last 24 hours:  Temp:  [97.6 F (36.4 C)-98.6 F (37 C)] 98.6 F (37 C) (08/14 0759) Pulse Rate:  [44-141] 95 (08/14 0800) Resp:  [22-49] 30 (08/14 0800) BP: (60-144)/(37-124) 123/57 mmHg (08/14 0800) SpO2:  [86 %-100 %] 99 % (08/14 0800) Weight:  [94.3 kg (207 lb 14.3 oz)-98 kg (216 lb 0.8 oz)] 94.3 kg (207 lb 14.3 oz) (08/13 1845)  Weight change: -0.9 kg (-1 lb 15.8 oz) Filed Weights   09/06/14 1630 09/07/14 1500 09/07/14 1845  Weight: 97.6 kg (215 lb 2.7 oz) 98 kg (216 lb 0.8 oz) 94.3 kg (207 lb 14.3 oz)    Intake/Output:    Intake/Output Summary (Last 24 hours) at 09/08/14 0949 Last data filed at 09/08/14 0800  Gross per 24 hour  Intake    297 ml  Output   1500 ml  Net  -1203 ml     Physical Exam: General: NAD  HEENT Moist mucus membranes  Neck supple  Pulm/lungs Bilateral basilar crackles  CVS/Heart Regular rate, rhythm  Abdomen:  Soft, non tender  Extremities: Right arm 1+ edema, trace edema in all other extremities.   Neurologic: Alert, speech hard to understand  Skin: No acute lesions  Access:  Right Femoral permcath, clotted right arm AVG, right forearm AVF, left arm AVG, left forearm AVG       Basic Metabolic Panel:   Recent Labs Lab 09/05/14 1309 09/06/14 1436  NA 143  143 141  K 3.2*  3.2* 3.2*  CL 99*  100* 99*  CO2 31  31 32  GLUCOSE 91  91 140*  BUN 24*  24* 19  CREATININE 6.03*  6.20* 5.35*  CALCIUM 9.1  9.1 8.8*  MG 2.1  --   PHOS 4.8*  --      CBC:  Recent Labs Lab 09/05/14 1309 09/06/14 1436  WBC 4.8 8.3  HGB 11.3* 10.8*  HCT 35.1* 32.9*  MCV 94.8 94.4  PLT 150 147*      Microbiology:  Recent Results (from the past 720 hour(s))  MRSA PCR Screening     Status: None   Collection Time: 09/05/14  1:08 PM   Result Value Ref Range Status   MRSA by PCR NEGATIVE NEGATIVE Final    Comment:        The GeneXpert MRSA Assay (FDA approved for NASAL specimens only), is one component of a comprehensive MRSA colonization surveillance program. It is not intended to diagnose MRSA infection nor to guide or monitor treatment for MRSA infections.   Culture, blood (routine x 2)     Status: None (Preliminary result)   Collection Time: 09/05/14  1:09 PM  Result Value Ref Range Status   Specimen Description BLOOD UPPER LEFT ARM  Final   Special Requests BOTTLES DRAWN AEROBIC AND ANAEROBIC AER7ML ANA10ML  Final   Culture NO GROWTH 3 DAYS  Final   Report Status PENDING  Incomplete  Culture, blood (routine x 2)     Status: None (Preliminary result)   Collection Time: 09/05/14  1:23 PM  Result Value Ref Range Status   Specimen Description BLOOD LEFT ANTECUBITAL  Final   Special Requests BOTTLES DRAWN AEROBIC AND ANAEROBIC 10ML  Final   Culture NO GROWTH 3 DAYS  Final   Report Status PENDING  Incomplete    Coagulation Studies: No results for input(s): LABPROT, INR in the last 72 hours.  Urinalysis: No results for input(s): COLORURINE, LABSPEC, PHURINE, GLUCOSEU, HGBUR, BILIRUBINUR, KETONESUR, PROTEINUR, UROBILINOGEN, NITRITE, LEUKOCYTESUR in the last 72 hours.  Invalid input(s): APPERANCEUR    Imaging: US Venous Img Upper Bilat  09/07/2014   CLINICAL DATA:  Upper extremity edema  EXAM: BILATERAL UPPER EXTREMITY VENOUS DOPPLER ULTRASOUND  TECHNIQUE: Gray-scale sonography with graded compression, as well as color Doppler and duplex ultrasound were performed to evaluate the bilateral upper extremity deep venous systems from the level of the subclavian vein and including the jugular, axillary, basilic, radial, ulnar and upper cephalic vein. Spectral Doppler was utilized to evaluate flow at rest and with distal augmentation maneuvers.  COMPARISON:  11/01/2013  FINDINGS: RIGHT UPPER EXTREMITY  Internal  Jugular Vein: No evidence of thrombus. Normal compressibility, respiratory phasicity and response to augmentation.  Subclavian Vein: No evidence of thrombus. Normal compressibility, respiratory phasicity and response to augmentation.  Axillary Vein: No evidence of thrombus. Normal compressibility, respiratory phasicity and response to augmentation.  Cephalic Vein: No evidence of thrombus. Normal compressibility, respiratory phasicity and response to augmentation.  Basilic Vein: No evidence of thrombus. Normal compressibility, respiratory phasicity and response to augmentation.  Brachial Veins: No evidence of thrombus. Normal compressibility, respiratory phasicity and response to augmentation.  Radial Veins: No evidence of thrombus. Normal compressibility, respiratory phasicity and response to augmentation.  Ulnar Veins: No evidence of thrombus. Normal compressibility, respiratory phasicity and response to augmentation.  Venous Reflux:  None.  Other Findings: Multiple occluded dialysis grafts are noted within the right arm. Significant subcutaneous edema is noted in the region of the elbow and forearm. 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 Its proximal portion is patent although to in the forearm there is a considerable amount of dilatation and mural thrombus identified. The outflow vein is follow-through a venous stent which appears patent. The runoff beyond the stent however demonstrates occlusion similar to that seen on prior fistulogram dated 11/01/2013.  LEFT UPPER EXTREMITY  Internal Jugular Vein: No evidence of thrombus. Normal compressibility, respiratory phasicity and response to augmentation.  Subclavian Vein: No evidence of thrombus. Normal compressibility, respiratory phasicity and response to augmentation.  Axillary Vein: No evidence of thrombus. Normal compressibility, respiratory phasicity and response to augmentation.  Cephalic Vein: No evidence of thrombus. Normal  compressibility, respiratory phasicity and response to augmentation.  Basilic Vein: No evidence of thrombus. Normal compressibility, respiratory phasicity and response to augmentation.  Brachial Veins: No evidence of thrombus. Normal compressibility, respiratory phasicity and response to augmentation.  Radial Veins: No evidence of thrombus. Normal compressibility, respiratory phasicity and response to augmentation.  Ulnar Veins: No evidence of thrombus. Normal compressibility, respiratory phasicity and response to augmentation.  Venous Reflux:  None.  Other Findings:  None.  IMPRESSION: No evidence of deep venous thrombosis.  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 The occlusion occurs central to the previously placed stent.   Electronically Signed   By: Inez Catalina M.D.   On: 09/07/2014 19:45     Medications:     . aspirin EC  81 mg Oral Daily  . cinacalcet  30 mg Oral Q breakfast  . epoetin (EPOGEN/PROCRIT) injection  4,000 Units Intravenous Q M,W,F-HD  . feeding supplement (NEPRO CARB STEADY)  237 mL Oral Daily  . heparin subcutaneous  5,000 Units Subcutaneous 3 times per day  . midodrine  10  mg Oral TID WC     Assessment/ Plan:  64 y.o.black  male male with end-stage renal disease on hemodiaysis, chronic systolic congestive heart failurith EF 20-25% by 2-D echo in December 2015, was admitted on 09/05/2014 for PermCath placement and shortness of breath post procedure  MWF [facility]  1. End-stage renal disease: hemodialysis for last three days for pulmonary edema, peripheral edema. Unable to tolerate large ultrafiltration due to hypotension. Now hypotension has resolved as he is back on his [place] and on midodrine.  - plan on hemodialysis again Tomorrow. Will provide IV albumin to assist with ultrafiltration. Continue MWF schedule. Orders prepared.   - monitor daily for  dialysis need.   2. Hypotension: with congestive heart failure, peripheral vascular disease and multiple failed dialysis accesses. Unclear what patient's actual blood pressure is. Currently asymptomatic.  - continue midodine  - IV albumin with HD treatment. - There is still the option of using L-carnitine and/or pseudophedrine  3. Anemia of chronic kidney disease: hemoglobin 10.8 - Low dose epo ordered MWF on HD treatment.   4.Secondary hyperparathyroidism: phos 4.8  - Continue cinacalcet, check PTH  - Not currently on a phos binder  5. Systolic Congestive Heart Failure: in acute exacerbation with pulmonary edema and peripheral edema. UF with hemodialysis treatment. - Echocardiogram and dopplers reviewed with patient and family.  - Appreciate cardiology input.    LOS: [place], Torien Ramroop 8/14/20169:49 AM

## 2014-09-09 LAB — CBC
HEMATOCRIT: 34.3 % — AB (ref 40.0–52.0)
HEMATOCRIT: 34.5 % — AB (ref 40.0–52.0)
Hemoglobin: 11.4 g/dL — ABNORMAL LOW (ref 13.0–18.0)
Hemoglobin: 11.4 g/dL — ABNORMAL LOW (ref 13.0–18.0)
MCH: 31.6 pg (ref 26.0–34.0)
MCH: 31.1 pg (ref 26.0–34.0)
MCHC: 33.3 g/dL (ref 32.0–36.0)
MCHC: 33 g/dL (ref 32.0–36.0)
MCV: 95 fL (ref 80.0–100.0)
MCV: 94.3 fL (ref 80.0–100.0)
PLATELETS: 153 10*3/uL (ref 150–440)
Platelets: 168 10*3/uL (ref 150–440)
RBC: 3.61 MIL/uL — ABNORMAL LOW (ref 4.40–5.90)
RBC: 3.66 MIL/uL — ABNORMAL LOW (ref 4.40–5.90)
RDW: 15.4 % — AB (ref 11.5–14.5)
RDW: 15.8 % — AB (ref 11.5–14.5)
WBC: 7.8 10*3/uL (ref 3.8–10.6)
WBC: 8.6 10*3/uL (ref 3.8–10.6)

## 2014-09-09 LAB — RENAL FUNCTION PANEL
Albumin: 3.3 g/dL — ABNORMAL LOW (ref 3.5–5.0)
Anion gap: 14 (ref 5–15)
BUN: 32 mg/dL — AB (ref 6–20)
CHLORIDE: 93 mmol/L — AB (ref 101–111)
CO2: 30 mmol/L (ref 22–32)
CREATININE: 6.39 mg/dL — AB (ref 0.61–1.24)
Calcium: 8.3 mg/dL — ABNORMAL LOW (ref 8.9–10.3)
GFR calc Af Amer: 10 mL/min — ABNORMAL LOW (ref >60)
GFR calc non Af Amer: 8 mL/min — ABNORMAL LOW (ref >60)
GLUCOSE: 123 mg/dL — AB (ref 65–99)
POTASSIUM: 4.2 mmol/L (ref 3.5–5.1)
Phosphorus: 4.5 mg/dL (ref 2.5–4.6)
Sodium: 137 mmol/L (ref 135–145)

## 2014-09-09 LAB — HEPATITIS C ANTIBODY: HCV Ab: <0.1 s/co ratio (ref 0.0–0.9)

## 2014-09-09 LAB — HEPATITIS B SURFACE ANTIBODY,QUALITATIVE: Hep B S Ab: REACTIVE

## 2014-09-09 LAB — HEPATITIS B CORE ANTIBODY, TOTAL: Hep B Core Total Ab: NEGATIVE

## 2014-09-09 MED ORDER — PANTOPRAZOLE SODIUM 40 MG PO TBEC
40.0000 mg | DELAYED_RELEASE_TABLET | Freq: Every day | ORAL | Status: DC
  Administered 2014-09-10 – 2014-09-12 (×3): 40 mg via ORAL
  Filled 2014-09-09 (×3): qty 1

## 2014-09-09 NOTE — Progress Notes (Signed)
Patient resting off and on overnight, up to chair x 3 hours per patient request. No complaints of pain or need.  Patient is High risk for falls as he is impulsive, however patient is easily redirected.  See [facility] for further updates. Will continue to assess for changes/need.

## 2014-09-09 NOTE — Progress Notes (Signed)
Post hd tx 

## 2014-09-09 NOTE — Progress Notes (Signed)
[facility] [place] NAME: Domenic Schoenberger    MR#:  [id number]  DATE OF BIRTH:  Oct 08, 1950  SUBJECTIVE:  Seen in ICU, no shortness of breath, no chest pain. For HD today.  CHIEF COMPLAINT:  No chief complaint on file.    REVIEW OF SYSTEMS:   Review of Systems  Constitutional: Negative for fever and chills.  HENT: Negative for hearing loss.   Eyes: Negative for blurred vision, double vision and photophobia.  Respiratory: Positive for cough and shortness of breath. Negative for hemoptysis.   Cardiovascular: Negative for chest pain, palpitations, orthopnea and leg swelling.  Gastrointestinal: Negative for nausea, vomiting, abdominal pain and diarrhea.  Genitourinary: Negative for dysuria and urgency.  Musculoskeletal: Negative for myalgias and neck pain.  Skin: Negative for rash.  Neurological: Negative for dizziness, focal weakness, seizures, weakness and headaches.  Psychiatric/Behavioral: Negative for memory loss. The patient does not have insomnia.    DRUG ALLERGIES:  No Known Allergies  VITALS:  Blood pressure 127/69, pulse 98, temperature 98.4 F (36.9 C), temperature source Axillary, resp. rate 34, height 5\' 9"  (1.753 m), weight 94.3 kg (207 lb 14.3 oz), SpO2 100 %.  PHYSICAL EXAMINATION:  GENERAL:  63 y.o.-year-old patient sitting up, eating with assistance EYES: Pupils equal, round, reactive to light and accommodation. No scleral icterus. Extraocular muscles intact.  HEENT: Head atraumatic, normocephalic. Oropharynx and nasopharynx clear.  NECK:  Supple, no jugular venous distention. No thyroid enlargement, no tenderness.  LUNGS: Clear to auscultation.  CARDIOVASCULAR: S1, S2 normal. No murmurs, rubs, or gallops.  ABDOMEN: Soft, nontender, nondistended. Bowel sounds present. No organomegaly or mass.  EXTREMITIES: +2 pedal edema, cyanosis, or clubbing.  NEUROLOGIC: Cranial nerves II through XII are intact. Muscle  strength 5/5 in all extremities. Sensation intact. Gait not checked.  PSYCHIATRIC: The patient is groggy but oriented x 3.  SKIN: No obvious rash, lesion, or ulcer.    LABORATORY PANEL:   CBC  Recent Labs Lab 09/09/14 0258  WBC 7.8  HGB 11.4*  HCT 34.3*  PLT 153   ------------------------------------------------------------------------------------------------------------------  Chemistries   Recent Labs Lab 09/05/14 1309 09/06/14 1436  NA 143  143 141  K 3.2*  3.2* 3.2*  CL 99*  100* 99*  CO2 31  31 32  GLUCOSE 91  91 140*  BUN 24*  24* 19  CREATININE 6.03*  6.20* 5.35*  CALCIUM 9.1  9.1 8.8*  MG 2.1  --    ------------------------------------------------------------------------------------------------------------------  Cardiac Enzymes No results for input(s): TROPONINI in the last 168 hours. ------------------------------------------------------------------------------------------------------------------  RADIOLOGY:  US Venous Img Lower Bilateral  09/08/2014   CLINICAL DATA:  Bilateral lower extremity edema  EXAM: BILATERAL LOWER EXTREMITY VENOUS DOPPLER ULTRASOUND  TECHNIQUE: Gray-scale sonography with graded compression, as well as color Doppler and duplex ultrasound were performed to evaluate the lower extremity deep venous systems from the level of the common femoral vein and including the common femoral, femoral, profunda femoral, popliteal and calf veins including the posterior tibial, peroneal and gastrocnemius veins when visible. The superficial great saphenous vein was also interrogated. Spectral Doppler was utilized to evaluate flow at rest and with distal augmentation maneuvers in the common femoral, femoral and popliteal veins.  COMPARISON:  None.  FINDINGS: RIGHT LOWER EXTREMITY  Common Femoral Vein: No evidence of thrombus. Normal compressibility, respiratory phasicity and response to augmentation.  Saphenofemoral Junction: No evidence of thrombus.  Normal compressibility and flow on color Doppler imaging.  Profunda Femoral Vein:  No evidence of thrombus. Normal compressibility and flow on color Doppler imaging.  Femoral Vein: No evidence of thrombus. Normal compressibility, respiratory phasicity and response to augmentation.  Popliteal Vein: No evidence of thrombus. Normal compressibility, respiratory phasicity and response to augmentation.  Calf Veins: No evidence of thrombus. Normal compressibility and flow on color Doppler imaging.  Superficial Great Saphenous Vein: No evidence of thrombus. Normal compressibility and flow on color Doppler imaging.  Venous Reflux:  None.  Other Findings:  None.  LEFT LOWER EXTREMITY  Common Femoral Vein: No evidence of thrombus. Normal compressibility, respiratory phasicity and response to augmentation.  Saphenofemoral Junction: No evidence of thrombus. Normal compressibility and flow on color Doppler imaging.  Profunda Femoral Vein: No evidence of thrombus. Normal compressibility and flow on color Doppler imaging.  Femoral Vein: No evidence of thrombus. Normal compressibility, respiratory phasicity and response to augmentation.  Popliteal Vein: No evidence of thrombus. Normal compressibility, respiratory phasicity and response to augmentation.  Calf Veins: No evidence of thrombus. Normal compressibility and flow on color Doppler imaging.  Superficial Great Saphenous Vein: No evidence of thrombus. Normal compressibility and flow on color Doppler imaging.  Venous Reflux:  None.  Other Findings:  None.  IMPRESSION: No evidence of deep venous thrombosis.  Right thigh dialysis graft is patent.   Electronically Signed   By: Inez Catalina M.D.   On: 09/08/2014 13:46   US Venous Img Upper Bilat  09/07/2014   CLINICAL DATA:  Upper extremity edema  EXAM: BILATERAL UPPER EXTREMITY VENOUS DOPPLER ULTRASOUND  TECHNIQUE: Gray-scale sonography with graded compression, as well as color Doppler and duplex ultrasound were performed to  evaluate the bilateral upper extremity deep venous systems from the level of the subclavian vein and including the jugular, axillary, basilic, radial, ulnar and upper cephalic vein. Spectral Doppler was utilized to evaluate flow at rest and with distal augmentation maneuvers.  COMPARISON:  11/01/2013  FINDINGS: RIGHT UPPER EXTREMITY  Internal Jugular Vein: No evidence of thrombus. Normal compressibility, respiratory phasicity and response to augmentation.  Subclavian Vein: No evidence of thrombus. Normal compressibility, respiratory phasicity and response to augmentation.  Axillary Vein: No evidence of thrombus. Normal compressibility, respiratory phasicity and response to augmentation.  Cephalic Vein: No evidence of thrombus. Normal compressibility, respiratory phasicity and response to augmentation.  Basilic Vein: No evidence of thrombus. Normal compressibility, respiratory phasicity and response to augmentation.  Brachial Veins: No evidence of thrombus. Normal compressibility, respiratory phasicity and response to augmentation.  Radial Veins: No evidence of thrombus. Normal compressibility, respiratory phasicity and response to augmentation.  Ulnar Veins: No evidence of thrombus. Normal compressibility, respiratory phasicity and response to augmentation.  Venous Reflux:  None.  Other Findings: Multiple occluded dialysis grafts are noted within the right arm. Significant subcutaneous edema is noted in the region of the elbow and forearm. 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 Its proximal portion is patent although to in the forearm there is a considerable amount of dilatation and mural thrombus identified. The outflow vein is follow-through a venous stent which appears patent. The runoff beyond the stent however demonstrates occlusion similar to that seen on prior fistulogram dated 11/01/2013.  LEFT UPPER EXTREMITY  Internal Jugular Vein: No evidence of thrombus. Normal compressibility,  respiratory phasicity and response to augmentation.  Subclavian Vein: No evidence of thrombus. Normal compressibility, respiratory phasicity and response to augmentation.  Axillary Vein: No evidence of thrombus. Normal compressibility, respiratory phasicity and response to augmentation.  Cephalic Vein: No evidence of thrombus. Normal compressibility,  respiratory phasicity and response to augmentation.  Basilic Vein: No evidence of thrombus. Normal compressibility, respiratory phasicity and response to augmentation.  Brachial Veins: No evidence of thrombus. Normal compressibility, respiratory phasicity and response to augmentation.  Radial Veins: No evidence of thrombus. Normal compressibility, respiratory phasicity and response to augmentation.  Ulnar Veins: No evidence of thrombus. Normal compressibility, respiratory phasicity and response to augmentation.  Venous Reflux:  None.  Other Findings:  None.  IMPRESSION: No evidence of deep venous thrombosis.  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 The occlusion occurs central to the previously placed stent.   Electronically Signed   By: Inez Catalina M.D.   On: 09/07/2014 19:45    EKG:   Orders placed or performed during the hospital encounter of 08/27/14  . EKG 12-Lead  . EKG 12-Lead    ASSESSMENT AND PLAN:   Acute respiratory failure with hypoxia: This is due to interstitial volume overload, likely occurred during surgery to place a PermCath.   is breathing comfortably , for HD today. End-stage renal disease on hemodialysis:  Hemodialysis last 2 days per pulmonary edema peripheral edema. Unable to tolerate large a ultrafiltration secondary to hypotension. Renal is following. Patient feels better now and her x-ray tomorrow. Moderate Malnutrition; use all men with dialysis.  Hypotension: Chronic, likely due to severe nonischemic cardiomyopathy with ejection fraction of 20-25%. Agree with  increasing Midrin to 10 mg 3 times a day  Nonischemic cardiomyopathy, acute on chronic systolic congestive heart failure, nonobstructive coronary artery disease: Appreciate cardiology following. Continue to manage volume with hemodialysis  Prognosis  is poor secondary to CHF and renal failure with limited management options.  Respiratory  failure improving with the dialysis. Suspect sleep  Apnea. Transfer him to telemetry today.  Anemia of chronic disease: Stable. No signs of bleeding  Deconditioning physical therapy is following.  D/w daughter . All the records are reviewed and case discussed with Care Management/Social Workerr. Management plans discussed with the patient, family and they are in agreement.  CODE STATUS: Full  TOTAL TIME TAKING CARE OF THIS PATIENT: 30 minutes.  . POSSIBLE D/C IN 1-2 DAYS, DEPENDING ON CLINICAL CONDITION.   Epifanio Lesches M.D on 09/09/2014 at 12:33 PM  Between 7am to 6pm - Pager - [phone]  After 6pm go to [email or web address] - password [email or web address]  Office  [phone]  CC: Primary care physician; Estanislado Emms, MD

## 2014-09-09 NOTE — Care Management (Signed)
[facility] dialysis liaison aware of patient admission. Patient is active at [facility], M-W-F.  Patient was sleeping when I rounded ICU this morning.

## 2014-09-09 NOTE — Progress Notes (Signed)
   09/09/14 1130  Clinical Encounter Type  Visited With Patient  Visit Type Initial  Referral From Patient  Consult/Referral To Chaplain  Spiritual Encounters  Spiritual Needs Other (Comment)  Chaplain rounded in the unit and offered support to the patient as applicable. Chaplain Hank Walling A. Baeleigh Devincent Ext. [phone]

## 2014-09-09 NOTE — Progress Notes (Signed)
Pt transferred to room 201 per bed.  For dialysis today. VSS. Lungs a little coarse and diminished. Up to [facility] with one assist. A little unsteady.  Right arm elevated on 2-3 pillows.  Denies pain. Family in to see today. See flow sheet.

## 2014-09-09 NOTE — Progress Notes (Signed)
Hemodialysis end 

## 2014-09-09 NOTE — Progress Notes (Signed)
PROGRESS NOTE  Subjective:   64 yo with hx of ESRD, chronic systolic CHF with EF 07%, moderate pulmonary HTN, hypotension who was admitted with respiratory failure following porta-a-cath insertion. BP improved with Midodrin.   Echo :  Severe LV dysfunction with EF of 15-20%  Severe MR  Mild ( perhaps mild - moderate ) Aortic stenosis     Objective:    Vital Signs:   Temp:  [97.6 F (36.4 C)-98.5 F (36.9 C)] 98.4 F (36.9 C) (08/15 0733) Pulse Rate:  [50-104] 94 (08/15 0600) Resp:  [23-47] 33 (08/15 0600) BP: (101-132)/(52-115) 112/53 mmHg (08/15 0600) SpO2:  [65 %-100 %] 100 % (08/15 0600)      24-hour weight change: Weight change:   Weight trends: Filed Weights   09/06/14 1630 09/07/14 1500 09/07/14 1845  Weight: 215 lb 2.7 oz (97.6 kg) 216 lb 0.8 oz (98 kg) 207 lb 14.3 oz (94.3 kg)    Intake/Output:  08/14 0701 - 08/15 0700 In: 380 [P.O.:380] Out: -      Physical Exam: BP 112/53 mmHg  Pulse 94  Temp(Src) 98.4 F (36.9 C) (Axillary)  Resp 33  Ht 5\' 9"  (1.753 m)  Wt 207 lb 14.3 oz (94.3 kg)  BMI 30.69 kg/m2  SpO2 100%  Wt Readings from Last 3 Encounters:  09/07/14 207 lb 14.3 oz (94.3 kg)  08/27/14 223 lb (101.152 kg)  02/14/14 238 lb (107.956 kg)    General: Chronically ill appearing   Head: Normocephalic, atraumatic.  Eyes: conjunctivae/corneas clear.  EOM's intact.   Throat: normal  Neck:  thick , difficult to assess JVD  Lungs:    moaning, difficult to hear breath sounds   Heart:  RR   Abdomen:  Soft, non-tender, non-distended    Extremities: Clotted dialysis fistula in right upper arm .    Neurologic: A&O X3, CN II - XII are grossly intact.   Psych: Normal     Labs: BMET:  Recent Labs  09/06/14 1436  NA 141  K 3.2*  CL 99*  CO2 32  GLUCOSE 140*  BUN 19  CREATININE 5.35*  CALCIUM 8.8*    Liver function tests: No results for input(s): AST, ALT, ALKPHOS, BILITOT, PROT, ALBUMIN in the last 72 hours. No results for  input(s): LIPASE, AMYLASE in the last 72 hours.  CBC:  Recent Labs  09/06/14 1436 09/09/14 0258  WBC 8.3 7.8  HGB 10.8* 11.4*  HCT 32.9* 34.3*  MCV 94.4 95.0  PLT 147* 153    Cardiac Enzymes: No results for input(s): CKTOTAL, CKMB, TROPONINI in the last 72 hours.  Coagulation Studies: No results for input(s): LABPROT, INR in the last 72 hours.  Other: Invalid input(s): POCBNP No results for input(s): DDIMER in the last 72 hours. No results for input(s): HGBA1C in the last 72 hours. No results for input(s): CHOL, HDL, LDLCALC, TRIG, CHOLHDL in the last 72 hours. No results for input(s): TSH, T4TOTAL, T3FREE, THYROIDAB in the last 72 hours.  Invalid input(s): FREET3 No results for input(s): VITAMINB12, FOLATE, FERRITIN, TIBC, IRON, RETICCTPCT in the last 72 hours.   Other results:  EKG  ( personally reviewed )  - tele:  Sinus tach   Medications:    Infusions:    Scheduled Medications: . aspirin EC  81 mg Oral Daily  . cinacalcet  30 mg Oral Q breakfast  . epoetin (EPOGEN/PROCRIT) injection  4,000 Units Intravenous Q M,W,F-HD  . feeding supplement (NEPRO CARB STEADY)  237  mL Oral Daily  . heparin subcutaneous  5,000 Units Subcutaneous 3 times per day  . midodrine  10 mg Oral TID WC    Assessment/ Plan:    64 y.o. male with h/o ESRD (MWF dialysis schedule, [place]), non-ischemic cardiomyopathy (Echo 01/10/14: EF 20-25%, diff HK, inflat/infsept/inf AK. 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 Post-surgery, the patient became very short of breath, requiring a nonrebreather with high flow oxygen.  1.Acute respiratory distress -Likely d/t fluid overload with  surgery 8/11 in the setting of ESRD (MWF dialysis) -Had additional dialysis with subsequent improvement.   2. End-stage renal disease.  -Patient on dialyses at [facility] [place]. MWF schedule.  -Per nephrology  3. Hypotension - Continue Midodrine.  4. Non-ischemic cardiomyopathy and non-obstructive CAD -Cath 01/2014 showed widely patent coronary arteries with minimal non-obstructive CAD and severe non-ischemic cardiomyopathy (see above) -Echo 12/2013 as above - overall prognosis is very poor due to combined heart and renal failure with limited management options.    Kathlyn Sacramento , MD, [facility]  09/09/2014, 9:07 AM

## 2014-09-09 NOTE — Progress Notes (Signed)
Hemodialysis start 

## 2014-09-09 NOTE — Progress Notes (Signed)
Subjective:  Pt seen at bedside. Breathing comfortably. Having breakfast.  Due for HD today. Denies shortness of breath.   Objective:  Vital signs in last 24 hours:  Temp:  [97.6 F (36.4 C)-98.5 F (36.9 C)] 98.4 F (36.9 C) (08/15 0733) Pulse Rate:  [50-104] 94 (08/15 0600) Resp:  [23-47] 33 (08/15 0600) BP: (101-132)/(52-115) 112/53 mmHg (08/15 0600) SpO2:  [65 %-100 %] 100 % (08/15 0600)  Weight change:  Filed Weights   09/06/14 1630 09/07/14 1500 09/07/14 1845  Weight: 97.6 kg (215 lb 2.7 oz) 98 kg (216 lb 0.8 oz) 94.3 kg (207 lb 14.3 oz)    Intake/Output:    Intake/Output Summary (Last 24 hours) at 09/09/14 0901 Last data filed at 09/08/14 1800  Gross per 24 hour  Intake    320 ml  Output      0 ml  Net    320 ml     Physical Exam: General: NAD  HEENT Moist mucus membranes  Neck supple  Pulm/lungs Bilateral basilar crackles normal effort  CVS/Heart Regular rate, rhythm  Abdomen:  Soft, non tender, BS present  Extremities: Trace b/l LE edema  Neurologic: Alert, awake, follows commands  Skin: No acute lesions  Access:  Right Femoral permcath, clotted right arm AVG, right forearm AVF, left arm AVG, left forearm AVG       Basic Metabolic Panel:   Recent Labs Lab 09/05/14 1309 09/06/14 1436  NA 143  143 141  K 3.2*  3.2* 3.2*  CL 99*  100* 99*  CO2 31  31 32  GLUCOSE 91  91 140*  BUN 24*  24* 19  CREATININE 6.03*  6.20* 5.35*  CALCIUM 9.1  9.1 8.8*  MG 2.1  --   PHOS 4.8*  --      CBC:  Recent Labs Lab 09/05/14 1309 09/06/14 1436 09/09/14 0258  WBC 4.8 8.3 7.8  HGB 11.3* 10.8* 11.4*  HCT 35.1* 32.9* 34.3*  MCV 94.8 94.4 95.0  PLT 150 147* 153      Microbiology:  Recent Results (from the past 720 hour(s))  MRSA PCR Screening     Status: None   Collection Time: 09/05/14  1:08 PM  Result Value Ref Range Status   MRSA by PCR NEGATIVE NEGATIVE Final    Comment:        The GeneXpert MRSA Assay (FDA approved for NASAL  specimens only), is one component of a comprehensive MRSA colonization surveillance program. It is not intended to diagnose MRSA infection nor to guide or monitor treatment for MRSA infections.   Culture, blood (routine x 2)     Status: None (Preliminary result)   Collection Time: 09/05/14  1:09 PM  Result Value Ref Range Status   Specimen Description BLOOD UPPER LEFT ARM  Final   Special Requests BOTTLES DRAWN AEROBIC AND ANAEROBIC AER7ML ANA10ML  Final   Culture NO GROWTH 4 DAYS  Final   Report Status PENDING  Incomplete  Culture, blood (routine x 2)     Status: None (Preliminary result)   Collection Time: 09/05/14  1:23 PM  Result Value Ref Range Status   Specimen Description BLOOD LEFT ANTECUBITAL  Final   Special Requests BOTTLES DRAWN AEROBIC AND ANAEROBIC 10ML  Final   Culture NO GROWTH 4 DAYS  Final   Report Status PENDING  Incomplete    Coagulation Studies: No results for input(s): LABPROT, INR in the last 72 hours.  Urinalysis: No results for input(s): COLORURINE, LABSPEC, [place], [place],  HGBUR, BILIRUBINUR, KETONESUR, PROTEINUR, UROBILINOGEN, NITRITE, LEUKOCYTESUR in the last 72 hours.  Invalid input(s): APPERANCEUR    Imaging: US Venous Img Lower Bilateral  09/08/2014   CLINICAL DATA:  Bilateral lower extremity edema  EXAM: BILATERAL LOWER EXTREMITY VENOUS DOPPLER ULTRASOUND  TECHNIQUE: Gray-scale sonography with graded compression, as well as color Doppler and duplex ultrasound were performed to evaluate the lower extremity deep venous systems from the level of the common femoral vein and including the common femoral, femoral, profunda femoral, popliteal and calf veins including the posterior tibial, peroneal and gastrocnemius veins when visible. The superficial great saphenous vein was also interrogated. Spectral Doppler was utilized to evaluate flow at rest and with distal augmentation maneuvers in the common femoral, femoral and popliteal veins.  COMPARISON:   None.  FINDINGS: RIGHT LOWER EXTREMITY  Common Femoral Vein: No evidence of thrombus. Normal compressibility, respiratory phasicity and response to augmentation.  Saphenofemoral Junction: No evidence of thrombus. Normal compressibility and flow on color Doppler imaging.  Profunda Femoral Vein: No evidence of thrombus. Normal compressibility and flow on color Doppler imaging.  Femoral Vein: No evidence of thrombus. Normal compressibility, respiratory phasicity and response to augmentation.  Popliteal Vein: No evidence of thrombus. Normal compressibility, respiratory phasicity and response to augmentation.  Calf Veins: No evidence of thrombus. Normal compressibility and flow on color Doppler imaging.  Superficial Great Saphenous Vein: No evidence of thrombus. Normal compressibility and flow on color Doppler imaging.  Venous Reflux:  None.  Other Findings:  None.  LEFT LOWER EXTREMITY  Common Femoral Vein: No evidence of thrombus. Normal compressibility, respiratory phasicity and response to augmentation.  Saphenofemoral Junction: No evidence of thrombus. Normal compressibility and flow on color Doppler imaging.  Profunda Femoral Vein: No evidence of thrombus. Normal compressibility and flow on color Doppler imaging.  Femoral Vein: No evidence of thrombus. Normal compressibility, respiratory phasicity and response to augmentation.  Popliteal Vein: No evidence of thrombus. Normal compressibility, respiratory phasicity and response to augmentation.  Calf Veins: No evidence of thrombus. Normal compressibility and flow on color Doppler imaging.  Superficial Great Saphenous Vein: No evidence of thrombus. Normal compressibility and flow on color Doppler imaging.  Venous Reflux:  None.  Other Findings:  None.  IMPRESSION: No evidence of deep venous thrombosis.  Right thigh dialysis graft is patent.   Electronically Signed   By: Inez Catalina M.D.   On: 09/08/2014 13:46   US Venous Img Upper Bilat  09/07/2014   CLINICAL  DATA:  Upper extremity edema  EXAM: BILATERAL UPPER EXTREMITY VENOUS DOPPLER ULTRASOUND  TECHNIQUE: Gray-scale sonography with graded compression, as well as color Doppler and duplex ultrasound were performed to evaluate the bilateral upper extremity deep venous systems from the level of the subclavian vein and including the jugular, axillary, basilic, radial, ulnar and upper cephalic vein. Spectral Doppler was utilized to evaluate flow at rest and with distal augmentation maneuvers.  COMPARISON:  11/01/2013  FINDINGS: RIGHT UPPER EXTREMITY  Internal Jugular Vein: No evidence of thrombus. Normal compressibility, respiratory phasicity and response to augmentation.  Subclavian Vein: No evidence of thrombus. Normal compressibility, respiratory phasicity and response to augmentation.  Axillary Vein: No evidence of thrombus. Normal compressibility, respiratory phasicity and response to augmentation.  Cephalic Vein: No evidence of thrombus. Normal compressibility, respiratory phasicity and response to augmentation.  Basilic Vein: No evidence of thrombus. Normal compressibility, respiratory phasicity and response to augmentation.  Brachial Veins: No evidence of thrombus. Normal compressibility, respiratory phasicity and response to augmentation.  Radial Veins:  No evidence of thrombus. Normal compressibility, respiratory phasicity and response to augmentation.  Ulnar Veins: No evidence of thrombus. Normal compressibility, respiratory phasicity and response to augmentation.  Venous Reflux:  None.  Other Findings: Multiple occluded dialysis grafts are noted within the right arm. Significant subcutaneous edema is noted in the region of the elbow and forearm. 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 Its proximal portion is patent although to in the forearm there is a considerable amount of dilatation and mural thrombus identified. The outflow vein is follow-through a venous stent which appears patent. The  runoff beyond the stent however demonstrates occlusion similar to that seen on prior fistulogram dated 11/01/2013.  LEFT UPPER EXTREMITY  Internal Jugular Vein: No evidence of thrombus. Normal compressibility, respiratory phasicity and response to augmentation.  Subclavian Vein: No evidence of thrombus. Normal compressibility, respiratory phasicity and response to augmentation.  Axillary Vein: No evidence of thrombus. Normal compressibility, respiratory phasicity and response to augmentation.  Cephalic Vein: No evidence of thrombus. Normal compressibility, respiratory phasicity and response to augmentation.  Basilic Vein: No evidence of thrombus. Normal compressibility, respiratory phasicity and response to augmentation.  Brachial Veins: No evidence of thrombus. Normal compressibility, respiratory phasicity and response to augmentation.  Radial Veins: No evidence of thrombus. Normal compressibility, respiratory phasicity and response to augmentation.  Ulnar Veins: No evidence of thrombus. Normal compressibility, respiratory phasicity and response to augmentation.  Venous Reflux:  None.  Other Findings:  None.  IMPRESSION: No evidence of deep venous thrombosis.  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 The occlusion occurs central to the previously placed stent.   Electronically Signed   By: Inez Catalina M.D.   On: 09/07/2014 19:45     Medications:     . aspirin EC  81 mg Oral Daily  . cinacalcet  30 mg Oral Q breakfast  . epoetin (EPOGEN/PROCRIT) injection  4,000 Units Intravenous Q M,W,F-HD  . feeding supplement (NEPRO CARB STEADY)  237 mL Oral Daily  . heparin subcutaneous  5,000 Units Subcutaneous 3 times per day  . midodrine  10 mg Oral TID WC     Assessment/ Plan:  64 y.o.black  male male with end-stage renal disease on hemodiaysis, chronic systolic congestive heart failurith EF 20-25% by 2-D echo in December 2015, was admitted  on 09/05/2014 for PermCath placement and shortness of breath post procedure  MWF [facility]) [place] [facility]  1. End-stage renal disease: Had HD for 3 consecutive days, tolerated well with documented weight loss. Unable to tolerate large ultrafiltration due to hypotension. - pt due for HD today, orders have been prepared, thereafter next HD on Wednesday if still here.  2. Hypotension: with congestive heart failure, peripheral vascular disease and multiple failed dialysis accesses.   - BP this AM is 112/53, continue midodrine 10mg  po tid.  3. Anemia of chronic kidney disease: hgb up to 11.4, continue epogen 4000 units IV with HD.   4.Secondary hyperparathyroidism: last phos 4.8, pt also on sensipar, await PTH result.  5. Systolic Congestive Heart Failure: in acute exacerbation with pulmonary edema and peripheral edema.  EF quite low at 15-20%, seems better compensated after consecutive days of dialysis, will plan for HD today as above.    LOS: 4 Chrys Landgrebe 8/15/20169:01 AM

## 2014-09-10 ENCOUNTER — Encounter: Payer: Self-pay | Admitting: Vascular Surgery

## 2014-09-10 LAB — CULTURE, BLOOD (ROUTINE X 2)
CULTURE: NO GROWTH
Culture: NO GROWTH

## 2014-09-10 LAB — PARATHYROID HORMONE, INTACT (NO CA): PTH: 107 pg/mL — ABNORMAL HIGH (ref 15–65)

## 2014-09-10 LAB — MAGNESIUM: Magnesium: 2.2 mg/dL (ref 1.7–2.4)

## 2014-09-10 NOTE — Progress Notes (Signed)
MD notified of SVT run (13-beat). Orders for magnesium level.

## 2014-09-10 NOTE — Progress Notes (Signed)
Subjective:  Pt had HD yesterday. Tolerated well. Resting comfortably in bed.     Objective:  Vital signs in last 24 hours:  Temp:  [97.2 F (36.2 C)-98.1 F (36.7 C)] 97.2 F (36.2 C) (08/16 0747) Pulse Rate:  [81-102] 95 (08/16 0747) Resp:  [16-34] 30 (08/16 0747) BP: (87-124)/(43-83) 109/54 mmHg (08/16 0747) SpO2:  [92 %-100 %] 99 % (08/16 0747) Weight:  [94 kg (207 lb 3.7 oz)-95.7 kg (210 lb 15.7 oz)] 94.303 kg (207 lb 14.4 oz) (08/15 2015)  Weight change:  Filed Weights   09/09/14 1545 09/09/14 1915 09/09/14 2015  Weight: 95.7 kg (210 lb 15.7 oz) 94 kg (207 lb 3.7 oz) 94.303 kg (207 lb 14.4 oz)    Intake/Output:    Intake/Output Summary (Last 24 hours) at 09/10/14 1110 Last data filed at 09/10/14 0747  Gross per 24 hour  Intake      0 ml  Output   2000 ml  Net  -2000 ml     Physical Exam: General: NAD  HEENT Moist mucus membranes  Neck supple  Pulm/lungs Minimal rales at bases  CVS/Heart Regular rate, rhythm  Abdomen:  Soft, non tender, BS present  Extremities: Trace b/l LE edema  Neurologic: Alert, awake, follows commands  Skin: No acute lesions  Access:  Right Femoral permcath, clotted right arm AVG, right forearm AVF, left arm AVG, left forearm AVG       Basic Metabolic Panel:   Recent Labs Lab 09/05/14 1309 09/06/14 1436 09/09/14 1629  NA 143  143 141 137  K 3.2*  3.2* 3.2* 4.2  CL 99*  100* 99* 93*  CO2 31  31 32 30  GLUCOSE 91  91 140* 123*  BUN 24*  24* 19 32*  CREATININE 6.03*  6.20* 5.35* 6.39*  CALCIUM 9.1  9.1 8.8* 8.3*  MG 2.1  --  2.2  PHOS 4.8*  --  4.5     CBC:  Recent Labs Lab 09/05/14 1309 09/06/14 1436 09/09/14 0258 09/09/14 1629  WBC 4.8 8.3 7.8 8.6  HGB 11.3* 10.8* 11.4* 11.4*  HCT 35.1* 32.9* 34.3* 34.5*  MCV 94.8 94.4 95.0 94.3  PLT 150 147* 153 168      Microbiology:  Recent Results (from the past 720 hour(s))  MRSA PCR Screening     Status: None   Collection Time: 09/05/14  1:08 PM   Result Value Ref Range Status   MRSA by PCR NEGATIVE NEGATIVE Final    Comment:        The GeneXpert MRSA Assay (FDA approved for NASAL specimens only), is one component of a comprehensive MRSA colonization surveillance program. It is not intended to diagnose MRSA infection nor to guide or monitor treatment for MRSA infections.   Culture, blood (routine x 2)     Status: None   Collection Time: 09/05/14  1:09 PM  Result Value Ref Range Status   Specimen Description BLOOD UPPER LEFT ARM  Final   Special Requests BOTTLES DRAWN AEROBIC AND ANAEROBIC AER7ML ANA10ML  Final   Culture NO GROWTH 5 DAYS  Final   Report Status 09/10/2014 FINAL  Final  Culture, blood (routine x 2)     Status: None   Collection Time: 09/05/14  1:23 PM  Result Value Ref Range Status   Specimen Description BLOOD LEFT ANTECUBITAL  Final   Special Requests BOTTLES DRAWN AEROBIC AND ANAEROBIC 10ML  Final   Culture NO GROWTH 5 DAYS  Final   Report Status 09/10/2014  FINAL  Final    Coagulation Studies: No results for input(s): LABPROT, INR in the last 72 hours.  Urinalysis: No results for input(s): COLORURINE, LABSPEC, PHURINE, GLUCOSEU, HGBUR, BILIRUBINUR, KETONESUR, PROTEINUR, UROBILINOGEN, NITRITE, LEUKOCYTESUR in the last 72 hours.  Invalid input(s): APPERANCEUR    Imaging: US Venous Img Lower Bilateral  09/08/2014   CLINICAL DATA:  Bilateral lower extremity edema  EXAM: BILATERAL LOWER EXTREMITY VENOUS DOPPLER ULTRASOUND  TECHNIQUE: Gray-scale sonography with graded compression, as well as color Doppler and duplex ultrasound were performed to evaluate the lower extremity deep venous systems from the level of the common femoral vein and including the common femoral, femoral, profunda femoral, popliteal and calf veins including the posterior tibial, peroneal and gastrocnemius veins when visible. The superficial great saphenous vein was also interrogated. Spectral Doppler was utilized to evaluate flow at rest  and with distal augmentation maneuvers in the common femoral, femoral and popliteal veins.  COMPARISON:  None.  FINDINGS: RIGHT LOWER EXTREMITY  Common Femoral Vein: No evidence of thrombus. Normal compressibility, respiratory phasicity and response to augmentation.  Saphenofemoral Junction: No evidence of thrombus. Normal compressibility and flow on color Doppler imaging.  Profunda Femoral Vein: No evidence of thrombus. Normal compressibility and flow on color Doppler imaging.  Femoral Vein: No evidence of thrombus. Normal compressibility, respiratory phasicity and response to augmentation.  Popliteal Vein: No evidence of thrombus. Normal compressibility, respiratory phasicity and response to augmentation.  Calf Veins: No evidence of thrombus. Normal compressibility and flow on color Doppler imaging.  Superficial Great Saphenous Vein: No evidence of thrombus. Normal compressibility and flow on color Doppler imaging.  Venous Reflux:  None.  Other Findings:  None.  LEFT LOWER EXTREMITY  Common Femoral Vein: No evidence of thrombus. Normal compressibility, respiratory phasicity and response to augmentation.  Saphenofemoral Junction: No evidence of thrombus. Normal compressibility and flow on color Doppler imaging.  Profunda Femoral Vein: No evidence of thrombus. Normal compressibility and flow on color Doppler imaging.  Femoral Vein: No evidence of thrombus. Normal compressibility, respiratory phasicity and response to augmentation.  Popliteal Vein: No evidence of thrombus. Normal compressibility, respiratory phasicity and response to augmentation.  Calf Veins: No evidence of thrombus. Normal compressibility and flow on color Doppler imaging.  Superficial Great Saphenous Vein: No evidence of thrombus. Normal compressibility and flow on color Doppler imaging.  Venous Reflux:  None.  Other Findings:  None.  IMPRESSION: No evidence of deep venous thrombosis.  Right thigh dialysis graft is patent.   Electronically Signed    By: Inez Catalina M.D.   On: 09/08/2014 13:46     Medications:     . aspirin EC  81 mg Oral Daily  . cinacalcet  30 mg Oral Q breakfast  . epoetin (EPOGEN/PROCRIT) injection  4,000 Units Intravenous Q M,W,F-HD  . feeding supplement (NEPRO CARB STEADY)  237 mL Oral Daily  . heparin subcutaneous  5,000 Units Subcutaneous 3 times per day  . midodrine  10 mg Oral TID WC  . pantoprazole  40 mg Oral Daily     Assessment/ Plan:  64 y.o.black  male male with end-stage renal disease on hemodiaysis, chronic systolic congestive heart failurith EF 20-25% by 2-D echo in December 2015, was admitted on 09/05/2014 for PermCath placement and shortness of breath post procedure  MWF [facility]) [place] [facility]  1. End-stage renal disease: Had HD for 3 consecutive days, tolerated well with documented weight loss. Unable to tolerate large ultrafiltration due to hypotension. - pt had  HD yesterday, no acute indication for HD today, will plan for HD again tomorrow.  2. Hypotension: with congestive heart failure, peripheral vascular disease and multiple failed dialysis accesses.   - BP today is 109/54, continue midodrine 10mg  po tid.  3. Anemia of chronic kidney disease: hgb stable at 11.4, continue low dose epogen.   4.Secondary hyperparathyroidism: PTH low at 107, stop sensipar.  5. Systolic Congestive Heart Failure: in acute exacerbation with pulmonary edema and peripheral edema. EF quite low at 15-20%.  Improved post consecutive days of dailysis.    LOS: 5 Kerrick Miler 8/16/201611:10 AM

## 2014-09-10 NOTE — Evaluation (Signed)
Physical Therapy Evaluation Patient Details Name: [name] MRN: [id number] DOB: Dec 16, 1950 Today's Date: 09/10/2014   History of Present Illness  Patient is a 64 y/o male that presented with shortness of breath and for [facility] Cath placement.   Clinical Impression  Patient is a 64 y/o male that was admitted to [facility] for [facility] Cath placement and had been developing shortness of breath. Today patient presents with increased respiratory rate and shallow breathing throughout the examination, however he maintained appropriate O2 saturation levels throughout examination. Patient displayed flat affect with soft voice and generalized weakness. Per the RN staff he was fairly easy to get up last night, however on multiple attempts today he required heavy assistance for sit to stand, from multiple heights (bed and chair). Patient fatigues quickly with ambulation, and is unable to ambulate household distances safely during this evaluation. Patient at this time will require short term rehabilitation to increase his independence and safety with OOB mobility. Skilled acute PT services are indicated at this time to address his mobility deficits.     Follow Up Recommendations SNF    Equipment Recommendations       Recommendations for Other Services       Precautions / Restrictions Precautions Precautions: Fall Restrictions Weight Bearing Restrictions: No      Mobility  Bed Mobility Overal bed mobility: Needs Assistance Bed Mobility: Supine to Sit     Supine to sit: HOB elevated;Min guard     General bed mobility comments: Patient is able to bring himself upright on the side of the bed without physical assistance, however it is delayed and he requires vc's for hand rail usage.   Transfers Overall transfer level: Needs assistance Equipment used: Rolling walker (2 wheeled) Transfers: Sit to/from Stand Sit to Stand: Max assist         General transfer comment: PT attempted sit to stand  transfer from both the bed and the chair, both of which required heavy assistance secondary to LE weakness.   Ambulation/Gait Ambulation/Gait assistance: Min guard Ambulation Distance (Feet): 35 Feet Assistive device: Rolling walker (2 wheeled) Gait Pattern/deviations: Step-through pattern;Decreased step length - left;Decreased stance time - right;Trunk flexed   Gait velocity interpretation: <1.8 ft/sec, indicative of risk for recurrent falls General Gait Details: Patient displays slow reciprocal gait pattern with no loss of balance noted with RW.   Stairs            Wheelchair Mobility    Modified Rankin (Stroke Patients Only)       Balance Overall balance assessment: Needs assistance   Sitting balance-Leahy Scale: Good Sitting balance - Comments: No balance deficits noted in sitting.      Standing balance-Leahy Scale: Fair Standing balance comment: Patient displays poor balance throughout sit to stand transfer, however once in standing he displays appropriate balance with RW.                              Pertinent Vitals/Pain Pain Assessment:  (patient does not report any pain or discomfort during this session. No chest pain or headache noted.)    Home Living Family/patient expects to be discharged to:: Private residence Living Arrangements: Children;Spouse/significant other Available Help at Discharge: Family Type of Home: House Home Access: Stairs to enter   [employer] of Steps: 1 Home Layout: One level Home Equipment: Environmental consultant - 2 wheels;Shower seat;Bedside commode      Prior Function Level of Independence: Independent with assistive  device(s)         Comments: Patient reports he has been ambulating with RW independently prior to this session.      Hand Dominance        Extremity/Trunk Assessment   Upper Extremity Assessment: Generalized weakness           Lower Extremity Assessment:  (4+/5 knee extension, 4/5 hip  flexion)         Communication      Cognition Arousal/Alertness: Awake/alert;Lethargic Behavior During Therapy: WFL for tasks assessed/performed;Flat affect Overall Cognitive Status: Within Functional Limits for tasks assessed (Patient is oriented and responds appropriately to questions asked.)                      General Comments      Exercises        Assessment/Plan    PT Assessment Patient needs continued PT services  PT Diagnosis Difficulty walking;Generalized weakness   PT Problem List Decreased strength;Decreased knowledge of use of DME;Decreased balance;Decreased mobility;Decreased activity tolerance  PT Treatment Interventions Balance training;Gait training;Therapeutic activities;Therapeutic exercise   PT Goals (Current goals can be found in the Care Plan section) Acute Rehab PT Goals Patient Stated Goal: To return home when able.  PT Goal Formulation: With patient Time For Goal Achievement: 09/24/14 Potential to Achieve Goals: Fair    Frequency Min 2X/week   Barriers to discharge Decreased caregiver support;Inaccessible home environment Patient is unable to perform sit to stand without significant assistance nor walk household distances.     Co-evaluation               End of Session Equipment Utilized During Treatment: Gait belt Activity Tolerance: Patient tolerated treatment well Patient left: in chair;with call bell/phone within reach;with chair alarm set Nurse Communication: Mobility status         Time: 1443-1540 PT Time Calculation (min) (ACUTE ONLY): 24 min   Charges:   PT Evaluation $Initial PT Evaluation Tier I: 1 Procedure PT Treatments $Therapeutic Exercise: 8-22 mins   PT G Codes:        Kerman Passey, PT, DPT    09/10/2014, 2:03 PM

## 2014-09-10 NOTE — Progress Notes (Signed)
[place] [facility] [place] NAME: [name]    MR#:  [id number]  DATE OF BIRTH:  [birth year]  SUBJECTIVE:  Moved out of icu yesterday. Denies any shortness of breath, chest pain. Patient may have underlying sleep apnea and then sleeping most of the day. Tolerating the hemodialysis. BP is better.   CHIEF COMPLAINT:  No chief complaint on file.    REVIEW OF SYSTEMS:   Review of Systems  Constitutional: Negative for fever and chills.  HENT: Negative for hearing loss.   Eyes: Negative for blurred vision, double vision and photophobia.  Respiratory: Negative for cough, hemoptysis and shortness of breath.   Cardiovascular: Negative for chest pain, palpitations, orthopnea and leg swelling.  Gastrointestinal: Negative for nausea, vomiting, abdominal pain and diarrhea.  Genitourinary: Negative for dysuria and urgency.  Musculoskeletal: Negative for myalgias and neck pain.  Skin: Negative for rash.  Neurological: Negative for dizziness, focal weakness, seizures, weakness and headaches.  Psychiatric/Behavioral: Negative for memory loss. The patient does not have insomnia.    DRUG ALLERGIES:  No Known Allergies  VITALS:  Blood pressure 109/54, pulse 95, temperature 97.2 F (36.2 C), temperature source Axillary, resp. rate 30, height 5\' 9"  (1.753 m), weight 94.303 kg (207 lb 14.4 oz), SpO2 99 %.  PHYSICAL EXAMINATION:  GENERAL:  64 y.o.-year-old patient sitting up, eating with assistance EYES: Pupils equal, round, reactive to light and accommodation. No scleral icterus. Extraocular muscles intact.  HEENT: Head atraumatic, normocephalic. Oropharynx and nasopharynx clear.  NECK:  Supple, no jugular venous distention. No thyroid enlargement, no tenderness.  LUNGS: Clear to auscultation.  CARDIOVASCULAR: S1, S2 normal. No murmurs, rubs, or gallops.  ABDOMEN: Soft, nontender, nondistended. Bowel sounds present. No organomegaly or mass.   EXTREMITIES: +2 pedal edema, cyanosis, or clubbing.  NEUROLOGIC: Cranial nerves II through XII are intact. Muscle strength 5/5 in all extremities. Sensation intact. Gait not checked.  PSYCHIATRIC: The patient is groggy but oriented x 3.  SKIN: No obvious rash, lesion, or ulcer.    LABORATORY PANEL:   CBC  Recent Labs Lab 09/09/14 1629  WBC 8.6  HGB 11.4*  HCT 34.5*  PLT 168   ------------------------------------------------------------------------------------------------------------------  Chemistries   Recent Labs Lab 09/09/14 1629  NA 137  K 4.2  CL 93*  CO2 30  GLUCOSE 123*  BUN 32*  CREATININE 6.39*  CALCIUM 8.3*  MG 2.2   ------------------------------------------------------------------------------------------------------------------  Cardiac Enzymes No results for input(s): TROPONINI in the last 168 hours. ------------------------------------------------------------------------------------------------------------------  RADIOLOGY:  US Venous Img Lower Bilateral  09/08/2014   CLINICAL DATA:  Bilateral lower extremity edema  EXAM: BILATERAL LOWER EXTREMITY VENOUS DOPPLER ULTRASOUND  TECHNIQUE: Gray-scale sonography with graded compression, as well as color Doppler and duplex ultrasound were performed to evaluate the lower extremity deep venous systems from the level of the common femoral vein and including the common femoral, femoral, profunda femoral, popliteal and calf veins including the posterior tibial, peroneal and gastrocnemius veins when visible. The superficial great saphenous vein was also interrogated. Spectral Doppler was utilized to evaluate flow at rest and with distal augmentation maneuvers in the common femoral, femoral and popliteal veins.  COMPARISON:  None.  FINDINGS: RIGHT LOWER EXTREMITY  Common Femoral Vein: No evidence of thrombus. Normal compressibility, respiratory phasicity and response to augmentation.  Saphenofemoral Junction: No evidence  of thrombus. Normal compressibility and flow on color Doppler imaging.  Profunda Femoral Vein: No evidence of thrombus. Normal compressibility and flow on color Doppler imaging.  Femoral Vein: No evidence of thrombus. Normal compressibility, respiratory phasicity and response to augmentation.  Popliteal Vein: No evidence of thrombus. Normal compressibility, respiratory phasicity and response to augmentation.  Calf Veins: No evidence of thrombus. Normal compressibility and flow on color Doppler imaging.  Superficial Great Saphenous Vein: No evidence of thrombus. Normal compressibility and flow on color Doppler imaging.  Venous Reflux:  None.  Other Findings:  None.  LEFT LOWER EXTREMITY  Common Femoral Vein: No evidence of thrombus. Normal compressibility, respiratory phasicity and response to augmentation.  Saphenofemoral Junction: No evidence of thrombus. Normal compressibility and flow on color Doppler imaging.  Profunda Femoral Vein: No evidence of thrombus. Normal compressibility and flow on color Doppler imaging.  Femoral Vein: No evidence of thrombus. Normal compressibility, respiratory phasicity and response to augmentation.  Popliteal Vein: No evidence of thrombus. Normal compressibility, respiratory phasicity and response to augmentation.  Calf Veins: No evidence of thrombus. Normal compressibility and flow on color Doppler imaging.  Superficial Great Saphenous Vein: No evidence of thrombus. Normal compressibility and flow on color Doppler imaging.  Venous Reflux:  None.  Other Findings:  None.  IMPRESSION: No evidence of deep venous thrombosis.  Right thigh dialysis graft is patent.   Electronically Signed   By: Inez Catalina M.D.   On: 09/08/2014 13:46    EKG:   Orders placed or performed during the hospital encounter of 08/27/14  . EKG 12-Lead  . EKG 12-Lead    ASSESSMENT AND PLAN:   Acute respiratory failure with hypoxia: This is due to interstitial volume overload, likely occurred during  surgery to place a PermCath.   is breathing comfortably , hhemodialysis as per nephrology. Documented weight loss seen.  End-stage renal disease on hemodialysis:  Hemodialysis last 2 days per pulmonary edema peripheral edema. Unable to tolerate large a ultrafiltration secondary to hypotension. Renal is following. Patient feels better now   Moderate Malnutrition; .  Hypotension: Chronic, likely due to severe nonischemic cardiomyopathy with ejection fraction of 20-25%. Agree with increasing Midrin to 10 mg 3 times a day  Nonischemic cardiomyopathy, acute on chronic systolic congestive heart failure, nonobstructive coronary artery disease: Appreciate cardiology following.   Continue to manage volume with hemodialysis  Prognosis  is poor secondary to CHF and renal failure with limited management options.  Respiratory  failure improving with the dialysis. Suspect sleep  Apnea. Transfer him to telemetry today.  Anemia of chronic disease: Stable. No signs of bleeding  Deconditioning physical therapy is following. possible sleep apnea;empirically  start CPAP at nig  D/w daughter . All the records are reviewed and case discussed with Care Management/Social Workerr. Management plans discussed with the patient, family and they are in agreement.  CODE STATUS: Full  TOTAL TIME TAKING CARE OF THIS PATIENT: 30 minutes.  . POSSIBLE D/C IN 1-2 DAYS, DEPENDING ON CLINICAL CONDITION.   Epifanio Lesches M.D on 09/10/2014 at 11:49 AM  Between 7am to 6pm - Pager - [phone]  After 6pm go to [email or web address] - password [email or web address] [facility]  Office  [phone]  CC: Primary care physician; Estanislado Emms, MD

## 2014-09-10 NOTE — Progress Notes (Signed)
Nutrition Follow-up  DOCUMENTATION CODES:   Non-severe (moderate) malnutrition in context of chronic illness  INTERVENTION:  Meals and snacks: Cater to pt preferences Nutrition Supplement Therapy: continue nepro q daily for added nutrition   NUTRITION DIAGNOSIS:   Inadequate oral intake related to chronic illness as evidenced by percent weight loss, mild depletion of body fat, mild depletion of muscle mass., being addressed with po diet and supplement   GOAL:   Patient will meet greater than or equal to 90% of their needs    MONITOR:    (Energy Intake, Anthropometrics, Digestive System, Electrolyte/Renal Profile, Glucose Profile)  REASON FOR ASSESSMENT:    (Renal Diet, Dialysis Patient)    ASSESSMENT:       Current Nutrition: Pt eating 77% of meals on average per I and O sheet.  Pt reports appetite doing good.  Drinking nepro, 3/4 consumed on bedside table   Gastrointestinal Profile: Last BM: 8/15   Medications: reviewed  Electrolyte/Renal Profile and Glucose Profile:   Recent Labs Lab 09/05/14 1309 09/06/14 1436 09/09/14 1629  NA 143  143 141 137  K 3.2*  3.2* 3.2* 4.2  CL 99*  100* 99* 93*  CO2 31  31 32 30  BUN 24*  24* 19 32*  CREATININE 6.03*  6.20* 5.35* 6.39*  CALCIUM 9.1  9.1 8.8* 8.3*  MG 2.1  --  2.2  PHOS 4.8*  --  4.5  GLUCOSE 91  91 140* 123*   Protein Profile:   Recent Labs Lab 09/05/14 1309 09/09/14 1629  ALBUMIN 3.4* 3.3*        Weight Trend since Admission: [place] Weights   09/09/14 1545 09/09/14 1915 09/09/14 2015  Weight: 210 lb 15.7 oz (95.7 kg) 207 lb 3.7 oz (94 kg) 207 lb 14.4 oz (94.303 kg)      Diet Order:  Diet renal with fluid restriction Fluid restriction:: 1200 mL Fluid; Room service appropriate?: Yes; Fluid consistency:: Thin  Skin:  Reviewed, no issues    Height:   Ht Readings from Last 1 Encounters:  09/05/14 5\' 9"  (1.753 m)    Weight:   Wt Readings from Last 1 Encounters:   09/09/14 207 lb 14.4 oz (94.303 kg)     BMI:  Body mass index is 30.69 kg/(m^2).  Estimated Nutritional Needs:   Kcal:  2165-2559 kcals (BEE 1514, 1.3 AF, 1.1-1.3 IF)   Protein:  88-110 g (1.2-1.5 g/kg) using IBW 72.7 kg  Fluid:  1000 mL plus UOP  EDUCATION NEEDS:   No education needs identified at this time  LOW Care Level  Merlen Gurry B. Zenia Resides, [place], [place] (pager)

## 2014-09-10 NOTE — Care Management Important Message (Signed)
Important Message  Patient Details  Name: DEMARRI ELIE MRN: [id number] Date of Birth: 24-Aug-1950   Medicare Important Message Given:  Yes-second notification given    Juliann Pulse A Allmond 09/10/2014, 10:30 AM

## 2014-09-11 LAB — RENAL FUNCTION PANEL
Albumin: 3.4 g/dL — ABNORMAL LOW (ref 3.5–5.0)
Anion gap: 13 (ref 5–15)
BUN: 39 mg/dL — AB (ref 6–20)
CHLORIDE: 95 mmol/L — AB (ref 101–111)
CO2: 28 mmol/L (ref 22–32)
Calcium: 8.2 mg/dL — ABNORMAL LOW (ref 8.9–10.3)
Creatinine, Ser: 6.73 mg/dL — ABNORMAL HIGH (ref 0.61–1.24)
GFR calc Af Amer: 9 mL/min — ABNORMAL LOW (ref >60)
GFR, EST NON AFRICAN AMERICAN: 8 mL/min — AB (ref >60)
GLUCOSE: 124 mg/dL — AB (ref 65–99)
POTASSIUM: 3.6 mmol/L (ref 3.5–5.1)
Phosphorus: 3.7 mg/dL (ref 2.5–4.6)
Sodium: 136 mmol/L (ref 135–145)

## 2014-09-11 LAB — CBC
HEMATOCRIT: 35.6 % — AB (ref 40.0–52.0)
Hemoglobin: 11.5 g/dL — ABNORMAL LOW (ref 13.0–18.0)
MCH: 30.6 pg (ref 26.0–34.0)
MCHC: 32.4 g/dL (ref 32.0–36.0)
MCV: 94.4 fL (ref 80.0–100.0)
Platelets: 198 10*3/uL (ref 150–440)
RBC: 3.77 MIL/uL — ABNORMAL LOW (ref 4.40–5.90)
RDW: 16.4 % — AB (ref 11.5–14.5)
WBC: 8.1 10*3/uL (ref 3.8–10.6)

## 2014-09-11 NOTE — Progress Notes (Signed)
POST HD   09/11/14 1330  Vital Signs  Pulse Rate 92  Resp (!) 37  BP (!) 105/50 mmHg  During Hemodialysis Assessment  Intra-Hemodialysis Comments HD TREATMENT END. GOAL MET.  BLOOD RETURNED AND CVC ACCESSED PER POLICY.  PT ALERT AND VS STABLE  Post-Hemodialysis Assessment  Rinseback Volume (mL) 250 mL  Dialyzer Clearance Lightly streaked  Duration of HD Treatment -hour(s) 3.5 hour(s)  Hemodialysis Intake (mL) 500 mL  UF Total -Machine (mL) 2500 mL  Net UF (mL) 2000 mL  Tolerated HD Treatment Yes  Post-Hemodialysis Comments HD TREATMENT END. GOAL MET.  BLOOD RETURNED AND CVC ACCESSED PER POLICY.  PT ALERT AND VS STABLE  Education / Care Plan  Hemodialysis Education Provided Yes  Documented Education in Clinical Pathway Yes  Hemodialysis Catheter Right Femoral vein Double-lumen  Placement Date/Time: 09/05/14 1100   Placed prior to admission: Yes  Person Inserting Catheter: Dr. Lucky Cowboy  Orientation: Right  Access Location: Femoral vein  Hemodialysis Catheter Type: Double-lumen  Site Condition No complications  Blue Lumen Status Heparin locked  Red Lumen Status Heparin locked  Purple Lumen Status N/A  Catheter fill solution Heparin 1000 units/ml  Catheter fill volume (Arterial) 2.5 cc  Catheter fill volume (Venous) 2.5  Dressing Type Gauze/Drain sponge  Dressing Status Clean;Dry;Intact  Drainage Description None  Post treatment catheter status Capped and Clamped

## 2014-09-11 NOTE — Progress Notes (Signed)
Subjective:  Pt seen during HD.  Tolerating well. Denies shortness of breath at the moment.     Objective:  Vital signs in last 24 hours:  Temp:  [97.1 F (36.2 C)-97.8 F (36.6 C)] 97.8 F (36.6 C) (08/17 0915) Pulse Rate:  [92-97] 94 (08/17 0930) Resp:  [16-34] 34 (08/17 0930) BP: (105-123)/(56-95) 109/72 mmHg (08/17 0930) SpO2:  [94 %-100 %] 98 % (08/17 0915) Weight:  [98.8 kg (217 lb 13 oz)] 98.8 kg (217 lb 13 oz) (08/17 0915)  Weight change:  Filed Weights   09/09/14 1915 09/09/14 2015 09/11/14 0915  Weight: 94 kg (207 lb 3.7 oz) 94.303 kg (207 lb 14.4 oz) 98.8 kg (217 lb 13 oz)    Intake/Output:    Intake/Output Summary (Last 24 hours) at 09/11/14 0952 Last data filed at 09/11/14 0830  Gross per 24 hour  Intake    540 ml  Output      0 ml  Net    540 ml     Physical Exam: General: NAD  HEENT Moist mucus membranes  Neck supple  Pulm/lungs Minimal rales at bases  CVS/Heart Regular rate, rhythm  Abdomen:  Soft, non tender, BS present  Extremities: Trace b/l LE edema  Neurologic: Alert, awake, follows commands  Skin: No acute lesions  Access:  Right Femoral permcath, clotted right arm AVG, right forearm AVF, left arm AVG, left forearm AVG       Basic Metabolic Panel:   Recent Labs Lab 09/05/14 1309 09/06/14 1436 09/09/14 1629  NA 143  143 141 137  K 3.2*  3.2* 3.2* 4.2  CL 99*  100* 99* 93*  CO2 31  31 32 30  GLUCOSE 91  91 140* 123*  BUN 24*  24* 19 32*  CREATININE 6.03*  6.20* 5.35* 6.39*  CALCIUM 9.1  9.1 8.8* 8.3*  MG 2.1  --  2.2  PHOS 4.8*  --  4.5     CBC:  Recent Labs Lab 09/05/14 1309 09/06/14 1436 09/09/14 0258 09/09/14 1629  WBC 4.8 8.3 7.8 8.6  HGB 11.3* 10.8* 11.4* 11.4*  HCT 35.1* 32.9* 34.3* 34.5*  MCV 94.8 94.4 95.0 94.3  PLT 150 147* 153 168      Microbiology:  Recent Results (from the past 720 hour(s))  MRSA PCR Screening     Status: None   Collection Time: 09/05/14  1:08 PM  Result Value Ref  Range Status   MRSA by PCR NEGATIVE NEGATIVE Final    Comment:        The GeneXpert MRSA Assay (FDA approved for NASAL specimens only), is one component of a comprehensive MRSA colonization surveillance program. It is not intended to diagnose MRSA infection nor to guide or monitor treatment for MRSA infections.   Culture, blood (routine x 2)     Status: None   Collection Time: 09/05/14  1:09 PM  Result Value Ref Range Status   Specimen Description BLOOD UPPER LEFT ARM  Final   Special Requests BOTTLES DRAWN AEROBIC AND ANAEROBIC AER7ML ANA10ML  Final   Culture NO GROWTH 5 DAYS  Final   Report Status 09/10/2014 FINAL  Final  Culture, blood (routine x 2)     Status: None   Collection Time: 09/05/14  1:23 PM  Result Value Ref Range Status   Specimen Description BLOOD LEFT ANTECUBITAL  Final   Special Requests BOTTLES DRAWN AEROBIC AND ANAEROBIC 10ML  Final   Culture NO GROWTH 5 DAYS  Final   Report  Status 09/10/2014 FINAL  Final    Coagulation Studies: No results for input(s): LABPROT, INR in the last 72 hours.  Urinalysis: No results for input(s): COLORURINE, LABSPEC, PHURINE, GLUCOSEU, HGBUR, BILIRUBINUR, KETONESUR, PROTEINUR, UROBILINOGEN, NITRITE, LEUKOCYTESUR in the last 72 hours.  Invalid input(s): APPERANCEUR    Imaging: No results found.   Medications:     . aspirin EC  81 mg Oral Daily  . epoetin (EPOGEN/PROCRIT) injection  4,000 Units Intravenous Q M,W,F-HD  . feeding supplement (NEPRO CARB STEADY)  237 mL Oral Daily  . heparin subcutaneous  5,000 Units Subcutaneous 3 times per day  . midodrine  10 mg Oral TID WC  . pantoprazole  40 mg Oral Daily     Assessment/ Plan:  64 y.o.black  male male with end-stage renal disease on hemodiaysis, chronic systolic congestive heart failurith EF 20-25% by 2-D echo in December 2015, was admitted on 09/05/2014 for PermCath placement and shortness of breath post procedure  M[facility] [place]  [facility]  1. End-stage renal disease: Had HD for 3 consecutive days, tolerated well with documented weight loss. Unable to tolerate large ultrafiltration due to hypotension. - Pt seen during HD, right femoral permcath being used, UF target 2kg today.  2. Hypotension: with congestive heart failure, peripheral vascular disease and multiple failed dialysis accesses.   - BP 109/72, continue midodrine 10mg  po tid.  3. Anemia of chronic kidney disease: hgb 11.4, on epogen 4000 units IV with HD.   4.Secondary hyperparathyroidism: PTH low at 107, sensipar stopped.   Phos 4.5 today and acceptable, pt not currently on binders.  5. Systolic Congestive Heart Failure: in acute exacerbation with pulmonary edema and peripheral edema. EF quite low at 15-20%.  Improved post consecutive days of dialysis, UF target today is 2kg.    LOS: 6 Effrey Davidow 8/17/20169:52 AM

## 2014-09-11 NOTE — Progress Notes (Signed)
Erroneous values entered for VS; NA, RN & RT at bedside.  VS retaken, pt does not appear in distress, all WNL.

## 2014-09-11 NOTE — Progress Notes (Signed)
PRE HD    09/11/14 0915  Report  Report Received From MAGGIE, RN  Vital Signs  Temp 97.8 F (36.6 C)  Temp Source Oral  Pulse Rate 94  Pulse Rate Source Monitor  Resp 19  BP 105/71 mmHg  BP Location Left Leg  BP Method Automatic  Patient Position (if appropriate) Lying  Oxygen Therapy  SpO2 98 %  O2 Device Room Air  Pain Assessment  Pain Assessment No/denies pain  Pain Score 0  Dialysis Weight  Weight 98.8 kg (217 lb 13 oz)  Type of Weight Pre-Dialysis  Time-Out for Hemodialysis  What Procedure? HEMODIALYSIS  Pt Identifiers(min of two) First/Last Name;MRN/Account#  Correct Site? Yes  Correct Side? Yes  Correct Procedure? Yes  Consents Verified? Yes  Rad Studies Available? N/A  Safety Precautions Reviewed? Yes  Engineer, civil (consulting) Number [id number]  Station Number 2  UF/Alarm Test Passed  Conductivity: Meter 13.6  Conductivity: Machine  13.9  pH 7.4  Reverse Osmosis MAIN  Dialyzer Lot Number [id number]  Disposable Set Lot Number [id number]  Machine Temperature 98.6 F (37 C)  Musician and Audible Yes  Blood Lines Intact and Secured Yes  Pre Treatment Patient Checks  Vascular access used during treatment Catheter  Hemodialysis Consent Verified Yes  Hemodialysis Standing Orders Initiated Yes  ECG (Telemetry) Monitor On Yes  Prime Ordered Normal Saline  Length of  DialysisTreatment -hour(s) 3.5 Hour(s)  Dialysis Treatment Comments GOAL TO REMOVE 2L OVER 3.5 HOURS.  PT ALERT AND VS STABLE. CVC ACCESSED WITHOUT DIFFICULTY.   Dialyzer Optiflux 180 NR  Dialysate Other (Comment) (3K2.5CA)  Dialysis Anticoagulant None  Dialysate Flow Ordered 600  Blood Flow Rate Ordered 400 mL/min  Ultrafiltration Goal 2 Liters  Pre Treatment Labs Renal panel;Phosphorus;CBC  Dialysis Blood Pressure Support Ordered Normal Saline  Hemodialysis Catheter Right Femoral vein Double-lumen  Placement Date/Time: 09/05/14 1100   Placed prior to admission: Yes  Person Inserting  Catheter: Dr. Lucky Cowboy  Orientation: Right  Access Location: Femoral vein  Hemodialysis Catheter Type: Double-lumen  Site Condition No complications  Blue Lumen Status Heparin locked  Red Lumen Status Heparin locked  Purple Lumen Status N/A  Catheter fill solution Heparin 1000 units/ml  Catheter fill volume (Arterial) 2.5 cc  Catheter fill volume (Venous) 2.5  Dressing Type Gauze/Drain sponge  Dressing Status Clean;Dry;Intact  Interventions Dressing changed  Drainage Description None  Dressing Change Due 09/13/14

## 2014-09-11 NOTE — Progress Notes (Signed)
[place] at [place] NAME: [name]    MR#:  [id number]  DATE OF BIRTH:  21-Sep-1950  SUBJECTIVE: Moved out of ICU, patient is more alert today started on CPAP machine at night. Does have some cough and wheezing.Marland Kitchen   CHIEF COMPLAINT:  No chief complaint on file.    REVIEW OF SYSTEMS:   Review of Systems  Constitutional: Negative for fever and chills.  HENT: Negative for hearing loss.   Eyes: Negative for blurred vision, double vision and photophobia.  Respiratory: Positive for cough and wheezing. Negative for hemoptysis and shortness of breath.   Cardiovascular: Negative for chest pain, palpitations, orthopnea and leg swelling.  Gastrointestinal: Negative for nausea, vomiting, abdominal pain and diarrhea.  Genitourinary: Negative for dysuria and urgency.  Musculoskeletal: Negative for myalgias and neck pain.  Skin: Negative for rash.  Neurological: Negative for dizziness, focal weakness, seizures, weakness and headaches.  Psychiatric/Behavioral: Negative for memory loss. The patient does not have insomnia.    DRUG ALLERGIES:  No Known Allergies  VITALS:  Blood pressure 109/72, pulse 94, temperature 97.8 F (36.6 C), temperature source Oral, resp. rate 34, height 5\' 9"  (1.753 m), weight 98.8 kg (217 lb 13 oz), SpO2 98 %.  PHYSICAL EXAMINATION:  GENERAL:  64 y.o.-year-old patient sitting up, eating with assistance EYES: Pupils equal, round, reactive to light and accommodation. No scleral icterus. Extraocular muscles intact.  HEENT: Head atraumatic, normocephalic. Oropharynx and nasopharynx clear.  NECK:  Supple, no jugular venous distention. No thyroid enlargement, no tenderness.  LUNGS: Coarse breath sounds present bilaterally. CARDIOVASCULAR: S1, S2 normal. No murmurs, rubs, or gallops.  ABDOMEN: Soft, nontender, nondistended. Bowel sounds present. No organomegaly or mass.  EXTREMITIES: +2 pedal edema, cyanosis, or  clubbing.  NEUROLOGIC: Cranial nerves II through XII are intact. Muscle strength 5/5 in all extremities. Sensation intact. Gait not checked.  PSYCHIATRIC: The patient is groggy but oriented x 3.  SKIN: No obvious rash, lesion, or ulcer.    LABORATORY PANEL:   CBC  Recent Labs Lab 09/09/14 1629  WBC 8.6  HGB 11.4*  HCT 34.5*  PLT 168   ------------------------------------------------------------------------------------------------------------------  Chemistries   Recent Labs Lab 09/09/14 1629  NA 137  K 4.2  CL 93*  CO2 30  GLUCOSE 123*  BUN 32*  CREATININE 6.39*  CALCIUM 8.3*  MG 2.2   ------------------------------------------------------------------------------------------------------------------  Cardiac Enzymes No results for input(s): TROPONINI in the last 168 hours. ------------------------------------------------------------------------------------------------------------------  RADIOLOGY:  No results found.  EKG:   Orders placed or performed during the hospital encounter of 08/27/14  . EKG 12-Lead  . EKG 12-Lead    ASSESSMENT AND PLAN:   Acute respiratory failure with hypoxia: This is due to interstitial volume overload, likely occurred during surgery to place a PermCath.   is breathing comfortably , hhemodialysis as per nephrology. Documented weight loss seen.  End-stage renal disease on hemodialysis:  Had HCT for 3 days consecutively. Nephrology is following for hemodialysis needs.  Moderate Malnutrition; . Dietitian is following. Continue nutritional supplements.  Hypotension: Chronic, likely due to severe nonischemic cardiomyopathy with ejection fraction of 20-25%. Agree with increasing Midrin to 10 mg 3 times a day  Nonischemic cardiomyopathy, acute on chronic systolic congestive heart failure, nonobstructive coronary artery disease: Appreciate cardiology following. Patient the unable to take any beta blockers or ACE inhibitor secondary to  chronic hypotension requiring midodrine.   Continue to manage volume with hemodialysis  Prognosis  is poor secondary to CHF  and renal failure with limited management options.  Respiratory  failure improving with the dialysis. Suspect sleep  Apnea. New CPAP.  Anemia of chronic disease: Stable. No signs of bleeding  Deconditioning physical therapy is following., They recommended nursing home placement. \   All the records are reviewed and case discussed with Care Management/Social Workerr. Management plans discussed with the patient, family and they are in agreement.  CODE STATUS: Full  TOTAL TIME TAKING CARE OF THIS PATIENT: 30 minutes.  . POSSIBLE D/C IN 1-2 DAYS, DEPENDING ON CLINICAL CONDITION.   Epifanio Lesches M.D on 09/11/2014 at 10:15 AM  Between 7am to 6pm - Pager - [phone]  After 6pm go to [email or web address] - password [email or web address] [facility]  Office  [phone]  CC: Primary care physician; Estanislado Emms, MD

## 2014-09-11 NOTE — Plan of Care (Signed)
Problem: Consults Goal: Skin Care Protocol Initiated - if Braden Score 18 or less If consults are not indicated, leave blank or document N/A  Outcome: Not Met (add Reason) Braden score of 20; pt declines repositioning

## 2014-09-11 NOTE — Progress Notes (Signed)
PRE HD    09/11/14 0915  Neurological  Level of Consciousness Alert  Orientation Level Oriented X4  Respiratory  Respiratory Pattern Tachypnea;Shallow  Chest Assessment Chest expansion symmetrical  Bilateral Breath Sounds Rhonchi  Cough Non-productive  Cardiac  Pulse Regular  ECG Monitor Yes  Cardiac Rhythm Heart block  Heart Block Type 1st degree AVB  Vascular  Edema Generalized  Generalized Edema +1  RUE Edema +4  LUE Edema +1  RLE Edema +2  LLE Edema +2  Integumentary  Integumentary (WDL) WDL  Musculoskeletal  Musculoskeletal (WDL) X  Generalized Weakness Yes  Gastrointestinal  Bowel Sounds Assessment Active  GU Assessment  Genitourinary (WDL) X  Genitourinary Symptoms Other (Comment) (HD )  Psychosocial  Psychosocial (WDL) X  Patient Behaviors Flat affect  Needs Expressed Denies  Emotional support given Given to patient

## 2014-09-11 NOTE — Progress Notes (Signed)
PT Cancellation Note  Patient Details Name: [name] MRN: [id number] DOB: November 05, 1950   Cancelled Treatment:     Treatment attempted this p.m; pt has recently returned from hemodialysis and is trying to eat lunch. Pt notes he may feel able to participate with PT later. Will re attempt at a later time as schedule allows.    Erline Levine Bishop 09/11/2014, 2:44 PM

## 2014-09-11 NOTE — Progress Notes (Signed)
POST HD   09/11/14 1315  Neurological  Level of Consciousness Alert  Orientation Level Oriented X4  Respiratory  Respiratory Pattern Tachypnea;Shallow  Chest Assessment Chest expansion symmetrical  Bilateral Breath Sounds Rhonchi  Cough Non-productive  Cardiac  Pulse Regular  ECG Monitor Yes  Cardiac Rhythm Heart block  Heart Block Type 1st degree AVB  Vascular  Edema Generalized  Generalized Edema +1  RUE Edema +4  LUE Edema +1  RLE Edema +2  LLE Edema +2  Integumentary  Integumentary (WDL) WDL  Musculoskeletal  Musculoskeletal (WDL) X  Generalized Weakness Yes  Gastrointestinal  Bowel Sounds Assessment Active  GU Assessment  Genitourinary (WDL) X  Genitourinary Symptoms Other (Comment) (HD)  Psychosocial  Psychosocial (WDL) X  Patient Behaviors Flat affect;Cooperative;Calm  Needs Expressed Denies  Emotional support given Given to patient

## 2014-09-12 ENCOUNTER — Ambulatory Visit: Admission: RE | Admit: 2014-09-12 | Payer: Medicare Other | Source: Ambulatory Visit | Admitting: Vascular Surgery

## 2014-09-12 ENCOUNTER — Encounter: Admission: RE | Payer: Self-pay | Source: Ambulatory Visit

## 2014-09-12 SURGERY — ARTERIOVENOUS (AV) FISTULA CREATION
Anesthesia: Choice | Laterality: Right

## 2014-09-12 MED ORDER — NEPRO/CARBSTEADY PO LIQD: 237.0000 mL | Freq: Three times a day (TID) | ORAL | Status: AC

## 2014-09-12 NOTE — Clinical Social Work Placement (Signed)
   CLINICAL SOCIAL WORK PLACEMENT  NOTE  Date:  09/12/2014  Patient Details  Name: [name] MRN: [id number] Date of Birth: [birth year]  Clinical Social Work is seeking post-discharge placement for this patient at the   level of care (*CSW will initial, date and re-position this form in  chart as items are completed):  Yes   Patient/family provided with [facility] Work Department's list of facilities offering this level of care within the geographic area requested by the patient (or if unable, by the patient's family).  Yes   Patient/family informed of their freedom to choose among providers that offer the needed level of care, that participate in Medicare, Medicaid or managed care program needed by the patient, have an available bed and are willing to accept the patient.  Yes   Patient/family informed of [facility]'s ownership interest in [facility] and [facility], as well as of the fact that they are under no obligation to receive care at these facilities.  PASRR submitted to EDS on 09/11/14     PASRR number received on 09/11/14     Existing PASRR number confirmed on       FL2 transmitted to all facilities in geographic area requested by pt/family on 09/11/14     FL2 transmitted to all facilities within larger geographic area on       Patient informed that his/her managed care company has contracts with or will negotiate with certain facilities, including the following:            Patient/family informed of bed offers received.  Patient chooses bed at       Physician recommends and patient chooses bed at      Patient to be transferred to   on  .  Patient to be transferred to facility by       Patient family notified on   of transfer.  Name of family member notified:        PHYSICIAN Please sign FL2, Please prepare priority discharge summary, including medications, Please prepare prescriptions     Additional Comment:     _______________________________________________ Alonna Buckler, LCSW 09/12/2014, 6:22 AM

## 2014-09-12 NOTE — Discharge Summary (Signed)
[name], is a 64 y.o. male  DOB [birth year]  MRN [id number].  Admission date:  09/05/2014  Admitting Physician  Algernon Huxley, MD  Discharge Date:  09/12/2014   Primary MD  Estanislado Emms, MD  Recommendations for primary care physician for things to follow:   Follow up follow up with nephrology regarding regular hemodialysis sessions. Follow-up with cardiology Dr.Arida  in about a week.   Admission Diagnosis  End Stage Renal   Discharge Diagnosis  End Stage Renal    Active Problems:   Fluid overload   Acute respiratory failure with hypoxia   Malnutrition of moderate degree   Acute systolic CHF (congestive heart failure)   End stage renal disease   Hemodialysis patient      Past Medical History  Diagnosis Date  . ESRD (end stage renal disease) 1996    a. BMS [place], M-W-F  . Anemia   . GERD (gastroesophageal reflux disease)   . Nonischemic cardiomyopathy     a. dx 2005;  b. 04/2009 Echo: EF  >55%;  c. 12/2013 Echo: EF 20-25%, diff HK, inflat/infsept/inf AK. Mild AS, mod MR, sev dil LA, PASP 47 mmHg.  Marland Kitchen Perirectal abscess     a. s/p I & D 2013.  Marland Kitchen Secondary hyperparathyroidism   . Chronic obstructive pulmonary disease   . Retroperitoneal mass     a. 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  . Hypotension     On midodrine for support   . CAD (coronary artery disease)     a. L and R cath 01/2014: widely patent coronary arteries with minimal non-obstructive CAD     Past Surgical History  Procedure Laterality Date  . Insertion of dialysis catheter    . Av fistula placement      Numerous fistulas placed in both arms and left leg; using rt arm fistula presently  . Examination under anesthesia  03/18/2011     Procedure: EXAM UNDER ANESTHESIA;  Surgeon: [name], MD;  Location: [facility];  Service: General;  Laterality: N/A;  exam under anesthesia, incision and drainage of perirectal abscess  . Parathyroidectomy    . Left and right heart catheterization with coronary angiogram N/A 01/31/2014    Procedure: LEFT AND RIGHT HEART CATHETERIZATION WITH CORONARY ANGIOGRAM;  Surgeon: Blane Ohara, MD;  Location: [facility] CATH LAB;  Service: Cardiovascular;  Laterality: N/A;  . Cardiac catheterization    . Peripheral vascular catheterization N/A 09/05/2014    Procedure: Dialysis/Perma Catheter Insertion;  Surgeon: Algernon Huxley, MD;  Location: [facility] CV LAB;  Service: Cardiovascular;  Laterality: N/A;       History of present illness and  Hospital Course:     Kindly see H&P for history of present illness and admission details, please review complete Labs, Consult reports and Test reports for all details in brief  HPI  from the history and physical done on the day of admission 64 year old male patient with history of COPD, chronic congestive heart failure with EF of 20%, ESRD on hemodialysis had a permacath placed by the vascular. After the permacath patient status post had respiratory distress and a re-admitted him for for respiratory failure requiring emergency hemodialysis. Patient also had a different on on nonrebreather mask because of respiratory problems. Patient was admitted to CCU stepdown.   Hospital Course  1: Acute respiratory failure secondary to  pulmonary edema due to fluid overload: Patient admitted to CCU stepdown started on nonrebreather mask, nephrology is consulted for emergent hemodialysis. Patient initial blood pressure was low, patient seen by [facility] nephrology from Dr. Candiss Norse, had the dialysis on the 11th, 12, 13th, 15th.,17th. Now he is on regular schedule on Monday Wednesday and Friday. His symptoms improved. Moved out of intensive care unit. Patient's saturation is 94% on room  air. 2.The 2 acute on chronic systolic heart failure EF of 20%. Unable to get beta blockers RS inhibitors, arsenic because of her hypotension requiring midodrine. 3.Chronic hypotension requiring midodrine. #4 deconditioning seen by physical therapy they recommended SNIF and patient's family chose 53.. Possible sleep apnea patient was more sleepy during the daytime IV and medical he put him on CPAP at night and that after starting the CPAP patient awareness and alertness carpet in the morning. And the patient will be empirically on CPAP but he needs a formal sleep studies for diagnosing sleep apnea. #5 ESRD on hemodialysis Monday Wednesday and Friday. #6 moderate malnutrition on nutritional supplements. #7 acute on chronic systolic heart failure history of nonischemic cardiomyopathy,  Non obstructive  coronary artery disease, COPD.    Discharge Condition: Stable   Follow UP      Discharge Instructions  and  Discharge Medications        Medication List    TAKE these medications        aspirin EC 81 MG tablet  Take 81 mg by mouth daily.     cinacalcet 30 MG tablet  Commonly known as:  SENSIPAR  Take 30 mg by mouth daily.     feeding supplement (NEPRO CARB STEADY) Liqd  Take 237 mLs by mouth 3 (three) times daily between meals.     lanthanum 1000 MG chewable tablet  Commonly known as:  FOSRENOL  Chew 1,000-2,500 mg by mouth 5 (five) times daily. Pt takes one tablet with snacks and two and one-half tablets with meals.     midodrine 10 MG tablet  Commonly known as:  PROAMATINE  Take 10 mg by mouth 3 (three) times daily.     pantoprazole 40 MG tablet  Commonly known as:  PROTONIX  Take 40 mg by mouth daily.          Diet and Activity recommendation: See Discharge Instructions above   Consults obtained;cardiology, nephrology   Major procedures and Radiology Reports - PLEASE review detailed and final reports for all details, in brief -      Dg Chest 1  View  09/05/2014   CLINICAL DATA:  Cardiomyopathy.  Fluid retention.  EXAM: CHEST  1 VIEW  COMPARISON:  10/11/2013  FINDINGS: Moderate cardiac enlargement noted. There is a small left pleural effusion identified. Pulmonary vascular congestion is identified.  IMPRESSION: 1. Cardiac enlargement. 2. Left pleural effusion and pulmonary vascular congestion.   Electronically Signed   By: Kerby Moors M.D.   On: 09/05/2014 14:40   Ct Head Wo Contrast  08/26/2014   CLINICAL DATA:  64 year old male fell yesterday. History of dementia. End-stage renal disease. Initial encounter.  EXAM: CT HEAD WITHOUT CONTRAST  TECHNIQUE: Contiguous axial images were obtained from the base of the skull through the vertex without intravenous contrast.  COMPARISON:  None.  FINDINGS: No skull fracture or intracranial hemorrhage.  No CT evidence of large acute infarct.  No intracranial mass lesion noted on this unenhanced exam.  Prominent vascular calcifications.  No hydrocephalus.  Mucosal thickening/ partial opacification right sphenoid sinus, left  maxillary sinus, left frontal sinus and ethmoid sinus air cells.  No orbital abnormality detected.  IMPRESSION: No skull fracture or intracranial hemorrhage.  No CT evidence of large acute infarct.  Mucosal thickening/ partial opacification right sphenoid sinus, left maxillary sinus, left frontal sinus and ethmoid sinus air cells.   Electronically Signed   By: Genia Del M.D.   On: 08/26/2014 16:43   US Venous Img Lower Bilateral  09/08/2014   CLINICAL DATA:  Bilateral lower extremity edema  EXAM: BILATERAL LOWER EXTREMITY VENOUS DOPPLER ULTRASOUND  TECHNIQUE: Gray-scale sonography with graded compression, as well as color Doppler and duplex ultrasound were performed to evaluate the lower extremity deep venous systems from the level of the common femoral vein and including the common femoral, femoral, profunda femoral, popliteal and calf veins including the posterior tibial, peroneal and  gastrocnemius veins when visible. The superficial great saphenous vein was also interrogated. Spectral Doppler was utilized to evaluate flow at rest and with distal augmentation maneuvers in the common femoral, femoral and popliteal veins.  COMPARISON:  None.  FINDINGS: RIGHT LOWER EXTREMITY  Common Femoral Vein: No evidence of thrombus. Normal compressibility, respiratory phasicity and response to augmentation.  Saphenofemoral Junction: No evidence of thrombus. Normal compressibility and flow on color Doppler imaging.  Profunda Femoral Vein: No evidence of thrombus. Normal compressibility and flow on color Doppler imaging.  Femoral Vein: No evidence of thrombus. Normal compressibility, respiratory phasicity and response to augmentation.  Popliteal Vein: No evidence of thrombus. Normal compressibility, respiratory phasicity and response to augmentation.  Calf Veins: No evidence of thrombus. Normal compressibility and flow on color Doppler imaging.  Superficial Great Saphenous Vein: No evidence of thrombus. Normal compressibility and flow on color Doppler imaging.  Venous Reflux:  None.  Other Findings:  None.  LEFT LOWER EXTREMITY  Common Femoral Vein: No evidence of thrombus. Normal compressibility, respiratory phasicity and response to augmentation.  Saphenofemoral Junction: No evidence of thrombus. Normal compressibility and flow on color Doppler imaging.  Profunda Femoral Vein: No evidence of thrombus. Normal compressibility and flow on color Doppler imaging.  Femoral Vein: No evidence of thrombus. Normal compressibility, respiratory phasicity and response to augmentation.  Popliteal Vein: No evidence of thrombus. Normal compressibility, respiratory phasicity and response to augmentation.  Calf Veins: No evidence of thrombus. Normal compressibility and flow on color Doppler imaging.  Superficial Great Saphenous Vein: No evidence of thrombus. Normal compressibility and flow on color Doppler imaging.  Venous  Reflux:  None.  Other Findings:  None.  IMPRESSION: No evidence of deep venous thrombosis.  Right thigh dialysis graft is patent.   Electronically Signed   By: Inez Catalina M.D.   On: 09/08/2014 13:46   US Venous Img Upper Bilat  09/07/2014   CLINICAL DATA:  Upper extremity edema  EXAM: BILATERAL UPPER EXTREMITY VENOUS DOPPLER ULTRASOUND  TECHNIQUE: Gray-scale sonography with graded compression, as well as color Doppler and duplex ultrasound were performed to evaluate the bilateral upper extremity deep venous systems from the level of the subclavian vein and including the jugular, axillary, basilic, radial, ulnar and upper cephalic vein. Spectral Doppler was utilized to evaluate flow at rest and with distal augmentation maneuvers.  COMPARISON:  11/01/2013  FINDINGS: RIGHT UPPER EXTREMITY  Internal Jugular Vein: No evidence of thrombus. Normal compressibility, respiratory phasicity and response to augmentation.  Subclavian Vein: No evidence of thrombus. Normal compressibility, respiratory phasicity and response to augmentation.  Axillary Vein: No evidence of thrombus. Normal compressibility, respiratory phasicity and response to augmentation.  Cephalic Vein: No evidence of thrombus. Normal compressibility, respiratory phasicity and response to augmentation.  Basilic Vein: No evidence of thrombus. Normal compressibility, respiratory phasicity and response to augmentation.  Brachial Veins: No evidence of thrombus. Normal compressibility, respiratory phasicity and response to augmentation.  Radial Veins: No evidence of thrombus. Normal compressibility, respiratory phasicity and response to augmentation.  Ulnar Veins: No evidence of thrombus. Normal compressibility, respiratory phasicity and response to augmentation.  Venous Reflux:  None.  Other Findings: Multiple occluded dialysis grafts are noted within the right arm. Significant subcutaneous edema is noted in the region of the elbow and forearm. There is a  radiocephalic arteriovenous fistula identified. Its proximal portion is patent although to in the forearm there is a considerable amount of dilatation and mural thrombus identified. The outflow vein is follow-through a venous stent which appears patent. The runoff beyond the stent however demonstrates occlusion similar to that seen on prior fistulogram dated 11/01/2013.  LEFT UPPER EXTREMITY  Internal Jugular Vein: No evidence of thrombus. Normal compressibility, respiratory phasicity and response to augmentation.  Subclavian Vein: No evidence of thrombus. Normal compressibility, respiratory phasicity and response to augmentation.  Axillary Vein: No evidence of thrombus. Normal compressibility, respiratory phasicity and response to augmentation.  Cephalic Vein: No evidence of thrombus. Normal compressibility, respiratory phasicity and response to augmentation.  Basilic Vein: No evidence of thrombus. Normal compressibility, respiratory phasicity and response to augmentation.  Brachial Veins: No evidence of thrombus. Normal compressibility, respiratory phasicity and response to augmentation.  Radial Veins: No evidence of thrombus. Normal compressibility, respiratory phasicity and response to augmentation.  Ulnar Veins: No evidence of thrombus. Normal compressibility, respiratory phasicity and response to augmentation.  Venous Reflux:  None.  Other Findings:  None.  IMPRESSION: No evidence of deep venous thrombosis.  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 The occlusion occurs central to the previously placed stent.   Electronically Signed   By: Inez Catalina M.D.   On: 09/07/2014 19:45    Micro Results     Recent Results (from the past 240 hour(s))  MRSA PCR Screening     Status: None   Collection Time: 09/05/14  1:08 PM  Result Value Ref Range Status   MRSA by PCR NEGATIVE NEGATIVE Final    Comment:        The GeneXpert MRSA Assay  (FDA approved for NASAL specimens only), is one component of a comprehensive MRSA colonization surveillance program. It is not intended to diagnose MRSA infection nor to guide or monitor treatment for MRSA infections.   Culture, blood (routine x 2)     Status: None   Collection Time: 09/05/14  1:09 PM  Result Value Ref Range Status   Specimen Description BLOOD UPPER LEFT ARM  Final   Special Requests BOTTLES DRAWN AEROBIC AND ANAEROBIC AER7ML ANA10ML  Final   Culture NO GROWTH 5 DAYS  Final   Report Status 09/10/2014 FINAL  Final  Culture, blood (routine x 2)     Status: None   Collection Time: 09/05/14  1:23 PM  Result Value Ref Range Status   Specimen Description BLOOD LEFT ANTECUBITAL  Final   Special Requests BOTTLES DRAWN AEROBIC AND ANAEROBIC 10ML  Final   Culture NO GROWTH 5 DAYS  Final   Report Status 09/10/2014 FINAL  Final       Today   Subjective:   [name] today, in the chair ,denies any chest pain ,shortness of  breath or cough.  Objective:   Blood pressure 143/85, pulse 101, temperature 98.1 F (36.7 C), temperature source Oral, resp. rate 20, height 5\' 9"  (1.753 m), weight 96.9 kg (213 lb 10 oz), SpO2 100 %.   Intake/Output Summary (Last 24 hours) at 09/12/14 1149 Last data filed at 09/12/14 0953  Gross per 24 hour  Intake    270 ml  Output   2000 ml  Net  -1730 ml    Exam Awake Alert, Oriented x 3, No new F.N deficits, Normal affect [place].AT,PERRAL Supple Neck,No JVD, No cervical lymphadenopathy appriciated.  Symmetrical Chest wall movement, Good air movement bilaterally, CTAB RRR,No Gallops,Rubs or new Murmurs, No Parasternal Heave +ve B.Sounds, Abd Soft, Non tender, No organomegaly appriciated, No rebound -guarding or rigidity. No Cyanosis, Clubbing or edema, No new Rash or bruise  Data Review   CBC w Diff: Lab Results  Component Value Date   WBC 8.1 09/11/2014   HGB 11.5* 09/11/2014   HCT 35.6* 09/11/2014   PLT 198 09/11/2014    LYMPHOPCT 15 12/25/2013   MONOPCT 12 12/25/2013   EOSPCT 2 12/25/2013   BASOPCT 1 12/25/2013    CMP: Lab Results  Component Value Date   NA 136 09/11/2014   K 3.6 09/11/2014   K 3.9 10/19/2013   CL 95* 09/11/2014   CO2 28 09/11/2014   BUN 39* 09/11/2014   CREATININE 6.73* 09/11/2014   PROT 7.1 01/24/2007   ALBUMIN 3.4* 09/11/2014   BILITOT 0.9 01/24/2007   ALKPHOS 60 01/24/2007   AST 26 01/24/2007   ALT 22 01/24/2007  .   Total Time in preparing paper work, data evaluation and todays exam - 22 minutes  Jontue Crumpacker M.D on 09/12/2014 at 11:49 AM

## 2014-09-12 NOTE — Clinical Social Work Note (Addendum)
Bed offers were given to pt's wife.  Wife initially chose [facility].  CSW informed pt of d/c today. CSW called [facility] to accept bed offer.  Santiago Glad at [facility] explained that she only had 1 male bed available and it was being held for another admission.  Santiago Glad also inquired about transportation to dialysis and stated the facility would not transport pt.   CSW called pt's wife and inquired about dialysis transportation.  Wife explained that she and pt's daughter have been transporting him to dialysis.  She also stated transportation through [place] has been arranged but has not been utilized.  CSW explained that [facility] will likely not be able to accept pt today and they will not transport pt to dialysis.  Wife selected [facility] as an alternate choice.    CSW called [facility] and left messages.  CSW received a return call from [place] at [facility] who confirmed that they can offer a bed and can also transport pt to dialysis.  CSW is waiting on a return call from [facility] with a room assignment.     [place], [place]

## 2014-09-12 NOTE — Clinical Social Work Note (Signed)
Clinical Social Work Assessment  Patient Details  Name: [name] MRN: [id number] Date of Birth: 05-04-1950  Date of referral:  09/11/14               Reason for consult:  Facility Placement                Permission sought to share information with:  Facility Sport and exercise psychologist, Family Supports Permission granted to share information::  Yes, Verbal Permission Granted  Name::        Agency::     Relationship::  wife, daughter  Contact Information:     Housing/Transportation Living arrangements for the past 2 months:  Single Family Home Source of Information:  Patient, Spouse Patient Interpreter Needed:  None Criminal Activity/Legal Involvement Pertinent to Current Situation/Hospitalization:  No - Comment as needed Significant Relationships:  Adult Children, Spouse Lives with:  Spouse Do you feel safe going back to the place where you live?    Need for family participation in patient care:  Yes (Comment)  Care giving concerns: Pt has supportive family however he spends the days by himself and some evenings too due to their work schedules.    Social Worker assessment / plan:  CSW was referred to Pt to assist with dc planning. Pt is married and has a supportive adult daughter. Pt is disabled due to medical condition. He goes to dialysis MWF at [facility]. He reports that his time is 5:45am.  Pt and CSW discussed STR at SNF at dc. Pt's wife was at work during [place] visit, so [place] called her from Pt's room. Pt and his wife are supportive of STR at dc.  Pt will likely need a [facility] and is possibly ready for dc on Thursday.  CSW will fax Pt's information out and update family with options on Thursday.   Employment status:  Disabled (Comment on whether or not currently receiving Disability) Insurance information:  Medicare PT Recommendations:  [place] / Referral to community resources:  [place]  Patient/Family's  Response to care:  Pt indicated that he understood the benefit of STR at dc.  Pt has difficulty speaking, but is able to participate in his discharge planning. Pt's daughter and wife are supportive and engaged in Pt's care.   Patient/Family's Understanding of and Emotional Response to Diagnosis, Current Treatment, and Prognosis:  Pt is motivated for rehab at [facility]. Pt and his family's goal is for him to return home after SNF.   Emotional Assessment Appearance:  Appears older than stated age Attitude/Demeanor/Rapport:   (cooperative) Affect (typically observed):  Accepting, Adaptable Orientation:  Oriented to Self, Oriented to Place, Oriented to  Time, Oriented to Situation Alcohol / Substance use:  Never Used Psych involvement (Current and /or in the community):     Discharge Needs  Concerns to be addressed:  Discharge Planning Concerns Readmission within the last 30 days:  No Current discharge risk:  Chronically ill, Dependent with Mobility Barriers to Discharge:  Continued Medical Work up   [employer], LCSW 09/12/2014, 6:14 AM

## 2014-09-12 NOTE — Progress Notes (Signed)
Subjective:  Pt sitting in chair this AM. In good spirits.  Had HD yesterday.  Pt to transition to SNF.   Objective:  Vital signs in last 24 hours:  Temp:  [97.8 F (36.6 C)-98.8 F (37.1 C)] 98.1 F (36.7 C) (08/18 0930) Pulse Rate:  [92-102] 101 (08/18 0930) Resp:  [18-37] 20 (08/18 0930) BP: (105-143)/(12-89) 143/85 mmHg (08/18 0930) SpO2:  [100 %] 100 % (08/18 0930) Weight:  [96.9 kg (213 lb 10 oz)] 96.9 kg (213 lb 10 oz) (08/17 1330)  Weight change:  Filed Weights   09/09/14 2015 09/11/14 0915 09/11/14 1330  Weight: 94.303 kg (207 lb 14.4 oz) 98.8 kg (217 lb 13 oz) 96.9 kg (213 lb 10 oz)    Intake/Output:    Intake/Output Summary (Last 24 hours) at 09/12/14 1234 Last data filed at 09/12/14 0953  Gross per 24 hour  Intake    270 ml  Output   2000 ml  Net  -1730 ml     Physical Exam: General: NAD  HEENT Moist mucus membranes  Neck supple  Pulm/lungs CTAB normal effort  CVS/Heart S1S2 no rubs  Abdomen:  Soft, non tender, BS present  Extremities: Trace b/l LE edema  Neurologic: Alert, awake, follows commands  Skin: No acute lesions  Access:  Right Femoral permcath, clotted right arm AVG, right forearm AVF, left arm AVG, left forearm AVG       Basic Metabolic Panel:   Recent Labs Lab 09/05/14 1309 09/06/14 1436 09/09/14 1629 09/11/14 1107  NA 143  143 141 137 136  K 3.2*  3.2* 3.2* 4.2 3.6  CL 99*  100* 99* 93* 95*  CO2 31  31 32 30 28  GLUCOSE 91  91 140* 123* 124*  BUN 24*  24* 19 32* 39*  CREATININE 6.03*  6.20* 5.35* 6.39* 6.73*  CALCIUM 9.1  9.1 8.8* 8.3* 8.2*  MG 2.1  --  2.2  --   PHOS 4.8*  --  4.5 3.7     CBC:  Recent Labs Lab 09/05/14 1309 09/06/14 1436 09/09/14 0258 09/09/14 1629 09/11/14 1107  WBC 4.8 8.3 7.8 8.6 8.1  HGB 11.3* 10.8* 11.4* 11.4* 11.5*  HCT 35.1* 32.9* 34.3* 34.5* 35.6*  MCV 94.8 94.4 95.0 94.3 94.4  PLT 150 147* 153 168 198      Microbiology:  Recent Results (from the past 720 hour(s))   MRSA PCR Screening     Status: None   Collection Time: 09/05/14  1:08 PM  Result Value Ref Range Status   MRSA by PCR NEGATIVE NEGATIVE Final    Comment:        The GeneXpert MRSA Assay (FDA approved for NASAL specimens only), is one component of a comprehensive MRSA colonization surveillance program. It is not intended to diagnose MRSA infection nor to guide or monitor treatment for MRSA infections.   Culture, blood (routine x 2)     Status: None   Collection Time: 09/05/14  1:09 PM  Result Value Ref Range Status   Specimen Description BLOOD UPPER LEFT ARM  Final   Special Requests BOTTLES DRAWN AEROBIC AND ANAEROBIC AER7ML ANA10ML  Final   Culture NO GROWTH 5 DAYS  Final   Report Status 09/10/2014 FINAL  Final  Culture, blood (routine x 2)     Status: None   Collection Time: 09/05/14  1:23 PM  Result Value Ref Range Status   Specimen Description BLOOD LEFT ANTECUBITAL  Final   Special Requests BOTTLES DRAWN AEROBIC  AND ANAEROBIC 10ML  Final   Culture NO GROWTH 5 DAYS  Final   Report Status 09/10/2014 FINAL  Final    Coagulation Studies: No results for input(s): LABPROT, INR in the last 72 hours.  Urinalysis: No results for input(s): COLORURINE, LABSPEC, PHURINE, GLUCOSEU, HGBUR, BILIRUBINUR, KETONESUR, PROTEINUR, UROBILINOGEN, NITRITE, LEUKOCYTESUR in the last 72 hours.  Invalid input(s): APPERANCEUR    Imaging: No results found.   Medications:     . aspirin EC  81 mg Oral Daily  . epoetin (EPOGEN/PROCRIT) injection  4,000 Units Intravenous Q M,W,F-HD  . feeding supplement (NEPRO CARB STEADY)  237 mL Oral Daily  . heparin subcutaneous  5,000 Units Subcutaneous 3 times per day  . midodrine  10 mg Oral TID WC  . pantoprazole  40 mg Oral Daily     Assessment/ Plan:  64 y.o.black  male male with end-stage renal disease on hemodiaysis, chronic systolic congestive heart failurith EF 20-25% by 2-D echo in December 2015, was admitted on 09/05/2014 for PermCath  placement and shortness of breath post procedure  MWF [facility]) [facility]  1. End-stage renal disease: Had HD for 3 consecutive days, tolerated well with documented weight loss. Unable to tolerate large ultrafiltration due to hypotension. - Pt had HD yesterday, no acute indication for HD today, pt to have HD as outpt tomorrow.   2. Hypotension: with congestive heart failure, peripheral vascular disease and multiple failed dialysis accesses.   -would discharge on midodrine.  3. Anemia of chronic kidney disease: continue to monitor hgb as outpt, continue epogen or mircera as outpt.   4.Secondary hyperparathyroidism: PTH low at 107, sensipar stopped.   -follow phos, pth, Ca as outpt, not currently on binder therapy, sensipar stopped this admission.  5. Systolic Congestive Heart Failure: in acute exacerbation with pulmonary edema and peripheral edema. EF quite low at 15-20%.  Improved from admission, would continue to monitor dry weight as outpt, and perform extra HD as needed, pt follows with [facility].    LOS: 7 Faline Langer 8/18/201612:34 PM

## 2014-09-12 NOTE — Care Management Important Message (Signed)
Important Message  Patient Details  Name: [name] MRN: [id number] Date of Birth: July 13, 1950   Medicare Important Message Given:  Yes-third notification given    Darius Bump Allmond 09/12/2014, 10:47 AM

## 2014-09-12 NOTE — Progress Notes (Signed)
Patient discharged at 1715 via EMS to [facility] in [place]. This nurse tried to call report to nurse receiving patient for an hour until [place] picked up the phone to receive report at 1800 .

## 2014-09-12 NOTE — Progress Notes (Signed)
Placed pt on CPAP for sleep. Pt O2 sat is 98%

## 2014-09-15 NOTE — Clinical Social Work Placement (Signed)
Late entry 09/15/14 for 09/12/14  CLINICAL SOCIAL WORK PLACEMENT  NOTE  Date:  09/15/2014  Patient Details  Name: [name] MRN: [id number] Date of Birth: 07/05/1950  Clinical Social Work is seeking post-discharge placement for this patient at the   level of care (*CSW will initial, date and re-position this form in  chart as items are completed):  Yes   Patient/family provided with [place] Work Department's list of facilities offering this level of care within the geographic area requested by the patient (or if unable, by the patient's family).  Yes   Patient/family informed of their freedom to choose among providers that offer the needed level of care, that participate in Medicare, Medicaid or managed care program needed by the patient, have an available bed and are willing to accept the patient.  Yes   Patient/family informed of [facility]'s ownership interest in [facility], as well as of the fact that they are under no obligation to receive care at these facilities.  PASRR submitted to EDS on 09/11/14     PASRR number received on 09/11/14     Existing PASRR number confirmed on       FL2 transmitted to all facilities in geographic area requested by pt/family on 09/11/14     FL2 transmitted to all facilities within larger geographic area on       Patient informed that his/her managed care company has contracts with or will negotiate with certain facilities, including the following:            Patient/family informed of bed offers received.  Patient chooses bed at [facility]     Physician recommends and patient chooses bed at      Patient to be transferred to [facility] on 09/12/14.  Patient to be transferred to facility by EMS     Patient family notified on 09/12/14 of transfer.  Name of family member notified:  Mrs. Cotten, daughter [place]     PHYSICIAN Please sign FL2, Please prepare priority discharge summary,  including medications, Please prepare prescriptions     Additional Comment:    _______________________________________________ Alonna Buckler, LCSW 09/15/2014, 6:33 AM

## 2014-09-15 NOTE — Progress Notes (Signed)
Late entry 09/15/14 for 09/12/14  CSW followed up with [facility] and they will be able to tranport Pt to HD at [facility] MWF. HD Coordinator assisted in adjusting HD time while Pt is in rehab to help with transportation issue. CSW followed up with Pt's wife to confirm bed. Pt's wife states that his daughter will be in the room and there during discharge. DC packet prepared. Rn to call report. CSW called EMS for transport.   Toma Copier, [place]

## 2014-09-20 ENCOUNTER — Other Ambulatory Visit (HOSPITAL_COMMUNITY): Payer: Self-pay | Admitting: Internal Medicine

## 2014-09-20 DIAGNOSIS — K219 Gastro-esophageal reflux disease without esophagitis: Secondary | ICD-10-CM

## 2014-09-20 DIAGNOSIS — R1310 Dysphagia, unspecified: Secondary | ICD-10-CM

## 2014-09-26 ENCOUNTER — Other Ambulatory Visit (HOSPITAL_COMMUNITY): Payer: Self-pay | Admitting: Internal Medicine

## 2014-09-26 ENCOUNTER — Ambulatory Visit (HOSPITAL_COMMUNITY): Admission: RE | Admit: 2014-09-26 | Payer: Medicare Other | Source: Ambulatory Visit

## 2014-09-26 DIAGNOSIS — R1314 Dysphagia, pharyngoesophageal phase: Secondary | ICD-10-CM

## 2014-09-27 ENCOUNTER — Ambulatory Visit (HOSPITAL_COMMUNITY): Payer: Medicare Other

## 2014-09-27 ENCOUNTER — Ambulatory Visit (HOSPITAL_COMMUNITY)
Admission: RE | Admit: 2014-09-27 | Discharge: 2014-09-27 | Disposition: A | Discharge status: Home / Self Care | Payer: Medicare Other | Source: Ambulatory Visit | Attending: Internal Medicine | Admitting: Internal Medicine

## 2014-09-27 DIAGNOSIS — Z992 Dependence on renal dialysis: Secondary | ICD-10-CM | POA: Diagnosis not present

## 2014-09-27 DIAGNOSIS — R1312 Dysphagia, oropharyngeal phase: Secondary | ICD-10-CM | POA: Insufficient documentation

## 2014-09-27 DIAGNOSIS — R1314 Dysphagia, pharyngoesophageal phase: Secondary | ICD-10-CM

## 2014-09-27 DIAGNOSIS — R05 Cough: Secondary | ICD-10-CM | POA: Diagnosis present

## 2014-10-01 ENCOUNTER — Other Ambulatory Visit: Payer: Self-pay | Admitting: Vascular Surgery

## 2014-10-01 ENCOUNTER — Ambulatory Visit (INDEPENDENT_AMBULATORY_CARE_PROVIDER_SITE_OTHER): Payer: Medicare Other | Admitting: Cardiovascular Disease

## 2014-10-01 ENCOUNTER — Emergency Department (HOSPITAL_COMMUNITY)
Admission: EM | Admit: 2014-10-01 | Discharge: 2014-10-01 | Disposition: A | Discharge status: Home / Self Care | Payer: Medicare Other | Attending: Emergency Medicine | Admitting: Emergency Medicine

## 2014-10-01 ENCOUNTER — Encounter (HOSPITAL_COMMUNITY): Payer: Self-pay | Admitting: Emergency Medicine

## 2014-10-01 VITALS — BP 126/70 | HR 90

## 2014-10-01 DIAGNOSIS — Z87891 Personal history of nicotine dependence: Secondary | ICD-10-CM | POA: Diagnosis not present

## 2014-10-01 DIAGNOSIS — Z862 Personal history of diseases of the blood and blood-forming organs and certain disorders involving the immune mechanism: Secondary | ICD-10-CM | POA: Insufficient documentation

## 2014-10-01 DIAGNOSIS — N186 End stage renal disease: Secondary | ICD-10-CM | POA: Diagnosis not present

## 2014-10-01 DIAGNOSIS — Z992 Dependence on renal dialysis: Secondary | ICD-10-CM | POA: Insufficient documentation

## 2014-10-01 DIAGNOSIS — I2510 Atherosclerotic heart disease of native coronary artery without angina pectoris: Secondary | ICD-10-CM | POA: Diagnosis not present

## 2014-10-01 DIAGNOSIS — I120 Hypertensive chronic kidney disease with stage 5 chronic kidney disease or end stage renal disease: Secondary | ICD-10-CM | POA: Insufficient documentation

## 2014-10-01 DIAGNOSIS — J449 Chronic obstructive pulmonary disease, unspecified: Secondary | ICD-10-CM | POA: Insufficient documentation

## 2014-10-01 DIAGNOSIS — Z7982 Long term (current) use of aspirin: Secondary | ICD-10-CM | POA: Insufficient documentation

## 2014-10-01 DIAGNOSIS — T82898A Other specified complication of vascular prosthetic devices, implants and grafts, initial encounter: Secondary | ICD-10-CM | POA: Diagnosis not present

## 2014-10-01 DIAGNOSIS — T829XXA Unspecified complication of cardiac and vascular prosthetic device, implant and graft, initial encounter: Secondary | ICD-10-CM

## 2014-10-01 DIAGNOSIS — I429 Cardiomyopathy, unspecified: Secondary | ICD-10-CM

## 2014-10-01 DIAGNOSIS — I428 Other cardiomyopathies: Secondary | ICD-10-CM

## 2014-10-01 NOTE — ED Provider Notes (Signed)
CSN: [id number]     Arrival date & time 10/01/14  1606 History   First MD Initiated Contact with Patient 10/01/14 1611     Chief Complaint  Patient presents with  . Vascular Access Problem     (Consider location/radiation/quality/duration/timing/severity/associated sxs/prior Treatment) HPI   [name] is a 64 year old male with end-stage renal disease, NICM, HF with EF 20%, COPD, CAD, he is a resident of [place] who is brought in today via [facility] for a CC of vascular access problem after his right femoral permacath was accidentally pulled out during physical therapy.  He does dialysis on Monday, Wednesday, Friday. Bleeding is controlled at this time. He has no other acute complaints.  Per PTAR, patient is at his baseline, he is able to ambulate and stand on his own without difficulty.    Past Medical History  Diagnosis Date  . ESRD (end stage renal disease) 1996    a. BMS [facility], M-W-F  . Anemia   . GERD (gastroesophageal reflux disease)   . Nonischemic cardiomyopathy     a. dx 2005;  b. 04/2009 Echo: EF  >55%;  c. 12/2013 Echo: EF 20-25%, diff HK, inflat/infsept/inf AK. Mild AS, mod MR, sev dil LA, PASP 47 mmHg.  Marland Kitchen Perirectal abscess     a. s/p I & D 2013.  Marland Kitchen Secondary hyperparathyroidism   . Chronic obstructive pulmonary disease   . Retroperitoneal mass     a. 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  . Hypotension     On midodrine for support   . CAD (coronary artery disease)     a. L and R cath 01/2014: widely patent coronary arteries with minimal non-obstructive CAD    Past Surgical History  Procedure Laterality Date  . Insertion of dialysis catheter    . Av fistula placement      Numerous fistulas placed in both arms and left leg; using rt arm fistula presently  . Examination under anesthesia  03/18/2011    Procedure: EXAM UNDER ANESTHESIA;  Surgeon: [name], MD;  Location: [facility];  Service: General;  Laterality: N/A;  exam under anesthesia, incision and drainage of perirectal abscess  . Parathyroidectomy    . Left and right heart catheterization with coronary angiogram N/A 01/31/2014    Procedure: LEFT AND RIGHT HEART CATHETERIZATION WITH CORONARY ANGIOGRAM;  Surgeon: Blane Ohara, MD;  Location: [facility] CATH LAB;  Service: Cardiovascular;  Laterality: N/A;  . Cardiac catheterization    . Peripheral vascular catheterization N/A 09/05/2014    Procedure: Dialysis/Perma Catheter Insertion;  Surgeon: Algernon Huxley, MD;  Location: [facility] CV LAB;  Service: Cardiovascular;  Laterality: N/A;   Family History  Problem Relation Age of Onset  . Heart failure Mother   . Heart failure Father   . Heart attack Brother    Social History  Substance Use Topics  . Smoking status: Former Smoker -- 2.00 packs/day for 15 years    Types: Cigarettes    Quit date: 03/11/1983  . Smokeless tobacco: Never Used  . Alcohol Use: No    Review of Systems  Constitutional: Negative.   HENT: Negative.   Eyes: Negative.   Respiratory: Positive for cough. Negative for chest tightness.   Cardiovascular: Positive for leg swelling. Negative for chest pain.  Genitourinary: Negative.   Musculoskeletal: Negative.   Skin: Negative.   Neurological: Negative.  Allergies  Review of patient's allergies indicates no known allergies.  Home Medications   Prior to Admission medications   Medication Sig Start Date End Date Taking? Authorizing Provider  aspirin EC 81 MG tablet Take 81 mg by mouth daily.    Historical Provider, MD  cinacalcet (SENSIPAR) 30 MG tablet Take 30 mg by mouth daily.    Historical Provider, MD  lanthanum (FOSRENOL) 1000 MG chewable tablet Chew 1,000-2,500 mg by mouth 5 (five) times daily. Pt takes one tablet with snacks and two and one-half tablets with meals.    Historical Provider, MD  midodrine (PROAMATINE) 10 MG tablet Take 10 mg by mouth 3  (three) times daily.     Historical Provider, MD  Nutritional Supplements (FEEDING SUPPLEMENT, NEPRO CARB STEADY,) LIQD Take 237 mLs by mouth 3 (three) times daily between meals. 09/12/14   Epifanio Lesches, MD  pantoprazole (PROTONIX) 40 MG tablet Take 40 mg by mouth daily.    Historical Provider, MD   BP 113/68 mmHg  Pulse 95  Temp(Src) 98.6 F (37 C) (Oral)  Resp 18  SpO2 100% Physical Exam  Constitutional: He is oriented to person, place, and time. He appears well-developed and well-nourished. No distress.  Elderly male, appears chronically ill, older than stated age, NAD  HENT:  Head: Normocephalic and atraumatic.  Nose: Nose normal.  Mouth/Throat: Oropharynx is clear and moist. No oropharyngeal exudate.  Eyes: Conjunctivae and EOM are normal. Pupils are equal, round, and reactive to light. Right eye exhibits no discharge. Left eye exhibits no discharge. No scleral icterus.  Neck: Normal range of motion. No JVD present. No tracheal deviation present. No thyromegaly present.  Cardiovascular: Normal rate and regular rhythm.   Edema of all extremities, worse in RUE  Pulmonary/Chest: Effort normal and breath sounds normal. No respiratory distress. He has no wheezes. He has no rales. He exhibits no tenderness.  Rhonchi radiating throughout precordium - transmission of bronchial and upper airway congestion  Abdominal: Soft. Bowel sounds are normal. He exhibits no distension and no mass. There is no tenderness. There is no rebound and no guarding.  Musculoskeletal: Normal range of motion. He exhibits edema. He exhibits no tenderness.  Lymphadenopathy:    He has no cervical adenopathy.  Neurological: He is alert and oriented to person, place, and time. He has normal reflexes. No cranial nerve deficit. He exhibits normal muscle tone. Coordination normal.  Skin: Skin is warm and dry. No rash noted. He is not diaphoretic. No erythema. No pallor.  AV fistula of right and left arm, right arm,  forearm and hand edematous Site of right femoral permcath - no active drainage or active bleeding    Psychiatric: He has a normal mood and affect. His behavior is normal. Judgment and thought content normal.  Nursing note and vitals reviewed.     ED Course  Procedures (including critical care time) Labs Review Labs Reviewed - No data to display  Imaging Review No results found. I have personally reviewed and evaluated these images and lab results as part of my medical decision-making.   EKG Interpretation None      MDM   Final diagnoses:  Complication of vascular access for dialysis, initial encounter    Paged vascular surgery - Dr. Redmond Pulling, he can replace tomorrow, pt does not need to be admitted.   Dr. Lucky Cowboy of [facility] recently placed the right perm cath on 09/05/14 - he will be contacted to see if their practice is available to  replace tomorrow, pt prefers to continue with their practice.  If they are not available, Dr. Redmond Pulling will put the pt on the schedule - will need to call him back if he is needed.  Dr. Lucky Cowboy will place [name] on the schedule for tomorrow afternoon.  Pt advised not to eat in the morning.  Vascular will contact him in the am.  Pt was discharged home with plan established.       Delsa Grana, PA-C 10/03/14 9643  Forde Dandy, MD 10/03/14 [phone]

## 2014-10-01 NOTE — Discharge Instructions (Signed)
Dr. Lucky Cowboy will contact you in the morning tomorrow.  In the afternoon he will have you scheduled for getting Permcath access reestablished.  Do not eat anything after midnight tonight.  Dialysis Vascular Access Malfunction A vascular access is an entrance to your blood vessels that can be used for dialysis. A vascular access can be made in one of several ways:   Joining an artery to a vein under your skin to make a bigger blood vessel called a fistula.   Joining an artery to a vein under your skin using a soft tube called a graft.   Placing a thin, flexible tube (catheter) in a large vein, usually in your neck.  A vascular access may malfunction or become blocked.  WHAT CAN CAUSE YOUR VASCULAR ACCESS TO MALFUNCTION?  Infection (common).   A blood clot inside a part of the fistula, graft, or catheter. A blood clot can completely or partially block the flow of blood.   A kink in the graft or catheter.   A collection of blood (called a hematoma or bruise) next to the graft or catheter that pushes against it, blocking the flow of blood.  WHAT ARE SIGNS AND SYMPTOMS OF VASCULAR ACCESS MALFUNCTION?  There is a change in the vibration or pulse of your fistula or graft.  The vibration or pulse of your fistula or graft is gone.   There is new or unusual swelling of the area around the access.   There was an unsuccessful puncture of your access by the dialysis team.   The flow of blood through the fistula, graft, or catheter is too slow for effective dialysis.   When routine dialysis is completed and the needle is removed, bleeding lasts for too long a time.  WHAT HAPPENS IF MY VASCULAR ACCESS MALFUNCTIONS? Your health care provider may order blood work, cultures, or an X-ray test in order to learn what may be wrong with your vascular access. The X-ray test involves the injection of a liquid into the vascular access. The liquid shows up on the X-ray and allows your health care  provider to see if there is a blockage in the vascular access.  Treatment varies depending on the cause of the malfunction:   If the vascular access is infected, your health care provider may prescribe antibiotic medicine to control the infection.   If a clot is found in the vascular access, you may need surgery to remove the clot.   If a blockage in the vascular access is due to some other cause (such as a kink in a graft), then you will likely need surgery to unblock or replace the graft.  HOME CARE INSTRUCTIONS: Follow up with your surgeon or other health care provider if you were instructed to do so. This is very important. Any delay in follow-up could cause permanent dysfunction of the vascular access, which may be dangerous.  SEEK MEDICAL CARE IF:   Fever develops.   Swelling and pain around the vascular access gets worse or new pain develops.  Pain, numbness, or an unusual pale skin color develops in the hand on the side of your vascular access. SEEK IMMEDIATE MEDICAL CARE IF: Unusual bleeding develops at the location of the vascular access. MAKE SURE YOU:  Understand these instructions.  Will watch your condition.  Will get help right away if you are not doing well or get worse. Document Released: 12/14/2005 Document Revised: 05/28/2013 Document Reviewed: 06/15/2012 [facility] Patient Information 2015 [place], [place]. This information is  not intended to replace advice given to you by your health care provider. Make sure you discuss any questions you have with your health care provider.

## 2014-10-01 NOTE — Patient Instructions (Signed)
Medication Instructions:  No medication changes today  Labwork: None today  Testing/Procedures: None today  Follow-Up: Your physician wants you to follow-up in: 4 months with Dr Johnsie Cancel. (January 2017).  You will receive a reminder letter in the mail two months in advance. If you don't receive a letter, please call our office to schedule the follow-up appointment.

## 2014-10-01 NOTE — Progress Notes (Signed)
Patient ID: WM [id number], male   DOB: November 24, 1950, 64 y.o.   MRN: [id number]   Patient Name: [name] Date of Encounter: 10/01/2014  Primary Care Provider:  Estanislado Emms, MD Primary Cardiologist:  P. Johnsie Cancel, MD   Patient Profile  64 y/o male with a h/o NICM who is here today for a follow-up visit after recent hospitalization at [facility].  Problem List   Past Medical History  Diagnosis Date  . ESRD (end stage renal disease) 1996    a. BMS [place], M-W-F  . Anemia   . GERD (gastroesophageal reflux disease)   . Nonischemic cardiomyopathy     a. dx 2005;  b. 04/2009 Echo: EF  >55%;  c. 12/2013 Echo: EF 20-25%, diff HK, inflat/infsept/inf AK. Mild AS, mod MR, sev dil LA, PASP 47 mmHg.  [facility] Kitchen Perirectal abscess     a. s/p I & D 2013.  [place] Kitchen Secondary hyperparathyroidism   . Chronic obstructive pulmonary disease   . Retroperitoneal mass     a. 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  . Hypotension     On midodrine for support   . CAD (coronary artery disease)     a. L and R cath 01/2014: widely patent coronary arteries with minimal non-obstructive CAD    Past Surgical History  Procedure Laterality Date  . Insertion of dialysis catheter    . Av fistula placement      Numerous fistulas placed in both arms and left leg; using rt arm fistula presently  . Examination under anesthesia  03/18/2011    Procedure: EXAM UNDER ANESTHESIA;  Surgeon: [name], MD;  Location: [facility];  Service: General;  Laterality: N/A;  exam under anesthesia, incision and drainage of perirectal abscess  . Parathyroidectomy    . Left and right heart catheterization with coronary angiogram N/A 01/31/2014    Procedure: LEFT AND RIGHT HEART CATHETERIZATION WITH CORONARY ANGIOGRAM;  Surgeon: Blane Ohara, MD;  Location: [facility] CATH LAB;  Service: Cardiovascular;  Laterality: N/A;  . Cardiac catheterization    .  Peripheral vascular catheterization N/A 09/05/2014    Procedure: Dialysis/Perma Catheter Insertion;  Surgeon: Algernon Huxley, MD;  Location: [facility] CV LAB;  Service: Cardiovascular;  Laterality: N/A;    Allergies  No Known Allergies  HPI  64 y/o male with the above complex problem list.  He has a h/o NICM dating back to 2005 -  last year, he unintentionally lost ~ 30 lbs.  He was referred to GI and a CT was performed and revealed a large right retroperitoneal mass.  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  Pathology has shown the latter.    Right and Left cath done by Dr Burt Knack  01/31/14 No CAD but high filling pressures and severe LV dysfunction with moderate functional AS He had a routine dialysis access procedure recently at [facility] After the procedure, the patient developed respiratory failure and had to be admitted to the hospital. He underwent urgent dialysis. He had an echocardiogram repeated which showed an ejection fraction of 15-20% with moderate to severe aortic stenosis and severe mitral regurgitation. The patient was hypotensive and thus no heart failure medications were used. Midodrin was continued for blood pressure support. The patient was discharged to rehabilitation. According to the wife, he is gradually getting stronger. He denies any chest pain or worsening dyspnea. He  has right arm swelling where he gets his dialysis but he denies any discomfort. He has problems with aspiration and thus was placed on a thick liquid diet.    Home Medications  Prior to Admission medications   Medication Sig Start Date End Date Taking? Authorizing Provider  lanthanum (FOSRENOL) 500 MG chewable tablet Chew 1,000 mg by mouth 3 (three) times daily with meals.   Yes Historical Provider, MD  midodrine (PROAMATINE) 10 MG tablet Take 10 mg by mouth 3 (three) times daily.    Yes Historical Provider, MD    Family History  Family History  Problem Relation Age of Onset  .  Heart failure Mother   . Heart failure Father   . Heart attack Brother     Social History  Social History   Social History  . Marital Status: Married    Spouse Name: N/A  . Number of Children: N/A  . Years of Education: N/A   Occupational History  . retired    Social History Main Topics  . Smoking status: Former Smoker -- 2.00 packs/day for 15 years    Types: Cigarettes    Quit date: 03/11/1983  . Smokeless tobacco: Never Used  . Alcohol Use: No  . Drug Use: No  . Sexual Activity: Not on file   Other Topics Concern  . Not on file   Social History Narrative   Lives in [place] with his wife.  Does not routinely exercise.     Review of Systems General:  No chills, fever, night sweats.  +++ wt loss. Cardiovascular:  No chest pain, +++ dyspnea on exertion, +++ edema, no orthopnea, palpitations, paroxysmal nocturnal dyspnea. +++ h/o falls and orthostatic syncope. Dermatological: No rash, lesions/masses Respiratory: No cough, dyspnea Urologic: No hematuria, dysuria Abdominal:   No nausea, vomiting, diarrhea, bright red blood per rectum, melena, or hematemesis Neurologic:  No visual changes, wkns, changes in mental status. All other systems reviewed and are otherwise negative except as noted above.  Physical Exam  Blood pressure 126/70, pulse 90.  General: Pleasant, NAD Psych: Normal affect. Neuro: Alert and oriented X 3. Moves all extremities spontaneously. HEENT: Normal  Neck: Supple without bruits or JVD. Lungs:  Resp regular and unlabored, bibasilar crackles. Heart: RRR no s3, s4, 3/6 syst murmur consistent with aortic stenosis with diminished S2. Abdomen: Soft, non-tender, non-distended, BS + x 4.  Extremities: No clubbing, cyanosis, 1+ bilat LE to edema to knees. DP/PT/Radials 1+ and equal bilaterally.  Accessory Clinical Findings  ECG -normal sinus rhythm with first degree AV block. Left axis deviation and nonspecific IVCD  Assessment & Plan  1.  Chronic  systolic heart failure:    Most recent ejection fraction was 15-20% with previous cardiac catheterization showing no obstructive coronary artery disease. Unfortunately, heart failure medications cannot be used due to baseline hypotension. Due to that and the fact that he has multiple comorbidities including end-stage renal disease, this makes his prognosis is extremely poor. I discussed this with him today and with his wife.  2.  ESRD:  MWF dialysis. Continue midodrine for BP support .   3.  Orthostatic hypotn w/ h/o syncope:  On midodrine.  4.  Moderate to severe aortic stenosis: He was not a candidate for any kind of intervention given his comorbidities and poor functional status.    Kathlyn Sacramento, MD 10/01/2014, 8:52 AM

## 2014-10-01 NOTE — ED Notes (Signed)
Per EMS: pt coming by [facility] from [facility] with c/o perm catheter dislodgement from his left groin. Pt had a 15 french catheter, catheter was accidentally removed during his physical therapy. Denies pain, reports some edema in bilateral legs, hx of same. Pt received dialysis yesterday. Pt A&Ox4, respirations equal and unlabored, skin warm and dry

## 2014-10-01 NOTE — ED Notes (Signed)
Spoke with wife regarding pt's discharge, wife is on the way to pick up patient.

## 2014-10-02 ENCOUNTER — Encounter
Admission: RE | Disposition: A | Discharge status: Skilled Nursing Facility | Payer: Self-pay | Source: Ambulatory Visit | Attending: Vascular Surgery

## 2014-10-02 ENCOUNTER — Ambulatory Visit
Admission: RE | Admit: 2014-10-02 | Discharge: 2014-10-02 | Disposition: A | Discharge status: Skilled Nursing Facility | Payer: Medicare Other | Source: Ambulatory Visit | Attending: Vascular Surgery | Admitting: Vascular Surgery

## 2014-10-02 DIAGNOSIS — T82898A Other specified complication of vascular prosthetic devices, implants and grafts, initial encounter: Secondary | ICD-10-CM | POA: Diagnosis present

## 2014-10-02 DIAGNOSIS — Z992 Dependence on renal dialysis: Secondary | ICD-10-CM | POA: Diagnosis not present

## 2014-10-02 DIAGNOSIS — I5022 Chronic systolic (congestive) heart failure: Secondary | ICD-10-CM | POA: Diagnosis not present

## 2014-10-02 DIAGNOSIS — I2510 Atherosclerotic heart disease of native coronary artery without angina pectoris: Secondary | ICD-10-CM | POA: Diagnosis not present

## 2014-10-02 DIAGNOSIS — Z79899 Other long term (current) drug therapy: Secondary | ICD-10-CM | POA: Insufficient documentation

## 2014-10-02 DIAGNOSIS — Z7982 Long term (current) use of aspirin: Secondary | ICD-10-CM | POA: Insufficient documentation

## 2014-10-02 DIAGNOSIS — N2581 Secondary hyperparathyroidism of renal origin: Secondary | ICD-10-CM | POA: Diagnosis not present

## 2014-10-02 DIAGNOSIS — Z87891 Personal history of nicotine dependence: Secondary | ICD-10-CM | POA: Insufficient documentation

## 2014-10-02 DIAGNOSIS — I252 Old myocardial infarction: Secondary | ICD-10-CM | POA: Insufficient documentation

## 2014-10-02 DIAGNOSIS — Y832 Surgical operation with anastomosis, bypass or graft as the cause of abnormal reaction of the patient, or of later complication, without mention of misadventure at the time of the procedure: Secondary | ICD-10-CM | POA: Insufficient documentation

## 2014-10-02 DIAGNOSIS — K219 Gastro-esophageal reflux disease without esophagitis: Secondary | ICD-10-CM | POA: Diagnosis not present

## 2014-10-02 DIAGNOSIS — N186 End stage renal disease: Secondary | ICD-10-CM | POA: Insufficient documentation

## 2014-10-02 DIAGNOSIS — D649 Anemia, unspecified: Secondary | ICD-10-CM | POA: Diagnosis not present

## 2014-10-02 DIAGNOSIS — J449 Chronic obstructive pulmonary disease, unspecified: Secondary | ICD-10-CM | POA: Diagnosis not present

## 2014-10-02 DIAGNOSIS — I429 Cardiomyopathy, unspecified: Secondary | ICD-10-CM | POA: Diagnosis not present

## 2014-10-02 HISTORY — PX: PERIPHERAL VASCULAR CATHETERIZATION: SHX172C

## 2014-10-02 LAB — GLUCOSE, CAPILLARY: Glucose-Capillary: 74 mg/dL (ref 65–99)

## 2014-10-02 SURGERY — DIALYSIS/PERMA CATHETER INSERTION
Anesthesia: Moderate Sedation

## 2014-10-02 MED ORDER — LIDOCAINE-EPINEPHRINE (PF) 1 %-1:200000 IJ SOLN
INTRAMUSCULAR | Status: DC | PRN
  Administered 2014-10-02: 10 mL via INTRADERMAL

## 2014-10-02 MED ORDER — DEXTROSE 5 % IV SOLN
1.5000 g | INTRAVENOUS | Status: AC
  Administered 2014-10-02: 1.5 g via INTRAVENOUS
  Filled 2014-10-02: qty 1.5

## 2014-10-02 MED ORDER — HEPARIN (PORCINE) IN NACL 2-0.9 UNIT/ML-% IJ SOLN
INTRAMUSCULAR | Status: AC
  Filled 2014-10-02: qty 500

## 2014-10-02 MED ORDER — SODIUM CHLORIDE 0.9 % IV SOLN
INTRAVENOUS | Status: DC
  Administered 2014-10-02: 12:00:00 via INTRAVENOUS

## 2014-10-02 MED ORDER — IOHEXOL 300 MG/ML  SOLN
INTRAMUSCULAR | Status: DC | PRN
  Administered 2014-10-02: 10 mL via INTRA_ARTERIAL

## 2014-10-02 MED ORDER — LIDOCAINE-EPINEPHRINE (PF) 1 %-1:200000 IJ SOLN
INTRAMUSCULAR | Status: AC
  Filled 2014-10-02: qty 30

## 2014-10-02 MED ORDER — HEPARIN SODIUM (PORCINE) 10000 UNIT/ML IJ SOLN
INTRAMUSCULAR | Status: AC
  Filled 2014-10-02: qty 1

## 2014-10-02 SURGICAL SUPPLY — 9 items
CATH PALINDROME-P 44CM KIT (CATHETERS) ×2
DERMABOND ADVANCED (GAUZE/BANDAGES/DRESSINGS) ×2
DERMABOND ADVANCED .7 DNX12 (GAUZE/BANDAGES/DRESSINGS) ×1 IMPLANT
GLIDEWIRE ADV .035X180CM (WIRE) ×3 IMPLANT
KIT CATH CHRNC PALINDROME PRCS (CATHETERS) ×1 IMPLANT
PACK ANGIOGRAPHY (CUSTOM PROCEDURE TRAY) ×3 IMPLANT
SUT MNCRL AB 4-0 PS2 18 (SUTURE) ×3 IMPLANT
SUT PROLENE 0 CT 1 30 (SUTURE) ×3 IMPLANT
TOWEL OR 17X26 4PK STRL BLUE (TOWEL DISPOSABLE) ×3 IMPLANT

## 2014-10-02 NOTE — Op Note (Signed)
OPERATIVE NOTE    PRE-OPERATIVE DIAGNOSIS: 1. ESRD 2. Difficult dialysis access with failed fistula  POST-OPERATIVE DIAGNOSIS: same as above  PROCEDURE: 1. Ultrasound guidance for vascular access to the left femoral  Vein 2. Left femoral and iliac venogram 3. Fluoroscopic guidance for placement of catheter 4. Placement of a 44 cm tip to cuff tunneled hemodialysis catheter via the left femoral vein  SURGEON: DEW,JASON, MD  ANESTHESIA:  Local/MCS  ESTIMATED BLOOD LOSS: minimal  FINDING(S): 1.  Patent left femoral vein   SPECIMEN(S):  None  INDICATIONS:   Patient is a 64 y.o.male who presents with no dialysis access.  His previous permcath has come out and his fistula has failed.   The patient needs long term dialysis access for their ESRD, and a Permcath is necessary.  Risks and benefits are discussed and informed consent is obtained.    DESCRIPTION: After obtaining full informed written consent, the patient was brought back to the vascular suited. The patient's left groin was sterilely prepped and draped and a sterile surgical field was created.  The left femoral vein was visualized with ultrasound and found to be patent. It was then accessed under direct ultrasound guidance and a permanent image was recorded. A wire was placed but did not easily traverse into the IVC.  He had a previous AVG on that side, so I was concerned about a venous issue and placed a 6 Fr Sheath and performed a venogram.  This showed some significant angulation and tortuosity of the iliac vein, but no significant stenosis.  Using this as a roadmap, a Terumo advantage wire was placed and the catheter would be placed over this wire. After skin nick and dilatation, the peel-away sheath was placed over the wire. I then turned my attention to an area about 4-5 cm inferior and lateral to the access incision and a small counterincision was created.  I tunneled from the counter  incision to the access site. Using  fluoroscopic guidance, a 72 centimeterer tip to cuff tunneled hemodialysis catheter was selected, and tunneled from the counter  incision to the access site. It was then placed through the peel-away sheath over the wire and the peel-away sheath was removed. Using fluoroscopic guidance the catheter tips were parked in the retrohepatic IVC just below the right atrium. The appropriate distal connectors were placed. It withdrew blood well and flushed easily with heparinized saline and a concentrated heparin solution was then placed. It was secured to the leg  with 2 Prolene sutures. The access incision was closed single 4-0 Monocryl. A 4-0 Monocryl pursestring suture was placed around the exit site. Sterile dressings were placed. The patient tolerated the procedure well and was taken to the recovery room in stable condition.  COMPLICATIONS: None  CONDITION: Stable    DEW,JASON 10/02/2014 2:29 PM

## 2014-10-02 NOTE — Discharge Instructions (Signed)
dialysis

## 2014-10-02 NOTE — H&P (Signed)
  [facility] History & Physical Update  The patient was interviewed and re-examined.  The patient's previous History and Physical has been reviewed and is changed in that his Permcath has been pulled out.  This needs to be replacd. We plan to proceed with the scheduled procedure.  Kashten Gowin, MD  10/02/2014, 12:11 PM

## 2014-10-04 ENCOUNTER — Encounter: Payer: Self-pay | Admitting: Vascular Surgery

## 2014-10-26 ENCOUNTER — Emergency Department (HOSPITAL_COMMUNITY): Payer: Medicare Other

## 2014-10-26 ENCOUNTER — Encounter (HOSPITAL_COMMUNITY): Payer: Self-pay | Admitting: *Deleted

## 2014-10-26 ENCOUNTER — Inpatient Hospital Stay (HOSPITAL_COMMUNITY)
Admission: EM | Admit: 2014-10-26 | Discharge: 2014-11-26 | DRG: 207 | Disposition: E | Payer: Medicare Other | Attending: Internal Medicine | Admitting: Internal Medicine

## 2014-10-26 DIAGNOSIS — K219 Gastro-esophageal reflux disease without esophagitis: Secondary | ICD-10-CM | POA: Diagnosis present

## 2014-10-26 DIAGNOSIS — Z6827 Body mass index (BMI) 27.0-27.9, adult: Secondary | ICD-10-CM

## 2014-10-26 DIAGNOSIS — S0101XA Laceration without foreign body of scalp, initial encounter: Secondary | ICD-10-CM | POA: Diagnosis present

## 2014-10-26 DIAGNOSIS — R000 Tachycardia, unspecified: Secondary | ICD-10-CM | POA: Diagnosis not present

## 2014-10-26 DIAGNOSIS — R4182 Altered mental status, unspecified: Secondary | ICD-10-CM | POA: Diagnosis present

## 2014-10-26 DIAGNOSIS — N186 End stage renal disease: Secondary | ICD-10-CM | POA: Diagnosis not present

## 2014-10-26 DIAGNOSIS — I132 Hypertensive heart and chronic kidney disease with heart failure and with stage 5 chronic kidney disease, or end stage renal disease: Secondary | ICD-10-CM | POA: Diagnosis present

## 2014-10-26 DIAGNOSIS — J9690 Respiratory failure, unspecified, unspecified whether with hypoxia or hypercapnia: Secondary | ICD-10-CM

## 2014-10-26 DIAGNOSIS — R570 Cardiogenic shock: Secondary | ICD-10-CM | POA: Diagnosis present

## 2014-10-26 DIAGNOSIS — I9589 Other hypotension: Secondary | ICD-10-CM | POA: Diagnosis present

## 2014-10-26 DIAGNOSIS — A419 Sepsis, unspecified organism: Secondary | ICD-10-CM | POA: Diagnosis not present

## 2014-10-26 DIAGNOSIS — Z7982 Long term (current) use of aspirin: Secondary | ICD-10-CM

## 2014-10-26 DIAGNOSIS — J9601 Acute respiratory failure with hypoxia: Principal | ICD-10-CM

## 2014-10-26 DIAGNOSIS — R6521 Severe sepsis with septic shock: Secondary | ICD-10-CM | POA: Diagnosis not present

## 2014-10-26 DIAGNOSIS — J449 Chronic obstructive pulmonary disease, unspecified: Secondary | ICD-10-CM | POA: Diagnosis present

## 2014-10-26 DIAGNOSIS — J810 Acute pulmonary edema: Secondary | ICD-10-CM

## 2014-10-26 DIAGNOSIS — Z992 Dependence on renal dialysis: Secondary | ICD-10-CM | POA: Diagnosis not present

## 2014-10-26 DIAGNOSIS — Z515 Encounter for palliative care: Secondary | ICD-10-CM | POA: Diagnosis present

## 2014-10-26 DIAGNOSIS — R627 Adult failure to thrive: Secondary | ICD-10-CM | POA: Diagnosis present

## 2014-10-26 DIAGNOSIS — E871 Hypo-osmolality and hyponatremia: Secondary | ICD-10-CM | POA: Diagnosis present

## 2014-10-26 DIAGNOSIS — I429 Cardiomyopathy, unspecified: Secondary | ICD-10-CM | POA: Diagnosis not present

## 2014-10-26 DIAGNOSIS — I5022 Chronic systolic (congestive) heart failure: Secondary | ICD-10-CM | POA: Diagnosis present

## 2014-10-26 DIAGNOSIS — N2581 Secondary hyperparathyroidism of renal origin: Secondary | ICD-10-CM | POA: Diagnosis present

## 2014-10-26 DIAGNOSIS — Z66 Do not resuscitate: Secondary | ICD-10-CM | POA: Diagnosis present

## 2014-10-26 DIAGNOSIS — D638 Anemia in other chronic diseases classified elsewhere: Secondary | ICD-10-CM | POA: Diagnosis present

## 2014-10-26 DIAGNOSIS — Z23 Encounter for immunization: Secondary | ICD-10-CM | POA: Diagnosis not present

## 2014-10-26 DIAGNOSIS — W06XXXA Fall from bed, initial encounter: Secondary | ICD-10-CM | POA: Diagnosis present

## 2014-10-26 DIAGNOSIS — S0990XA Unspecified injury of head, initial encounter: Secondary | ICD-10-CM

## 2014-10-26 DIAGNOSIS — Z87891 Personal history of nicotine dependence: Secondary | ICD-10-CM

## 2014-10-26 DIAGNOSIS — E162 Hypoglycemia, unspecified: Secondary | ICD-10-CM | POA: Diagnosis present

## 2014-10-26 DIAGNOSIS — J9600 Acute respiratory failure, unspecified whether with hypoxia or hypercapnia: Secondary | ICD-10-CM | POA: Diagnosis not present

## 2014-10-26 DIAGNOSIS — I2510 Atherosclerotic heart disease of native coronary artery without angina pectoris: Secondary | ICD-10-CM | POA: Diagnosis present

## 2014-10-26 DIAGNOSIS — R55 Syncope and collapse: Secondary | ICD-10-CM | POA: Diagnosis present

## 2014-10-26 LAB — I-STAT ARTERIAL BLOOD GAS, ED
Acid-Base Excess: 5 mmol/L — ABNORMAL HIGH (ref 0.0–2.0)
BICARBONATE: 32.3 meq/L — AB (ref 20.0–24.0)
O2 Saturation: 100 %
PO2 ART: 419 mmHg — AB (ref 80.0–100.0)
TCO2: 34 mmol/L (ref 0–100)
pCO2 arterial: 60.1 mmHg (ref 35.0–45.0)
pH, Arterial: 7.336 — ABNORMAL LOW (ref 7.350–7.450)

## 2014-10-26 LAB — COMPREHENSIVE METABOLIC PANEL
ALK PHOS: 98 U/L (ref 38–126)
ALT: 17 U/L (ref 17–63)
ALT: 18 U/L (ref 17–63)
ANION GAP: 10 (ref 5–15)
AST: 25 U/L (ref 15–41)
AST: 20 U/L (ref 15–41)
Albumin: 3.1 g/dL — ABNORMAL LOW (ref 3.5–5.0)
Albumin: 3 g/dL — ABNORMAL LOW (ref 3.5–5.0)
Alkaline Phosphatase: 94 U/L (ref 38–126)
Anion gap: 11 (ref 5–15)
BILIRUBIN TOTAL: 1.7 mg/dL — AB (ref 0.3–1.2)
BUN: 22 mg/dL — AB (ref 6–20)
BUN: 14 mg/dL (ref 6–20)
CALCIUM: 9.1 mg/dL (ref 8.9–10.3)
CHLORIDE: 96 mmol/L — AB (ref 101–111)
CO2: 29 mmol/L (ref 22–32)
CO2: 28 mmol/L (ref 22–32)
CREATININE: 4.87 mg/dL — AB (ref 0.61–1.24)
Calcium: 8.6 mg/dL — ABNORMAL LOW (ref 8.9–10.3)
Chloride: 98 mmol/L — ABNORMAL LOW (ref 101–111)
Creatinine, Ser: 3.36 mg/dL — ABNORMAL HIGH (ref 0.61–1.24)
GFR calc non Af Amer: 12 mL/min — ABNORMAL LOW (ref >60)
GFR, EST AFRICAN AMERICAN: 13 mL/min — AB (ref >60)
GFR, EST AFRICAN AMERICAN: 21 mL/min — AB (ref >60)
GFR, EST NON AFRICAN AMERICAN: 18 mL/min — AB (ref >60)
GLUCOSE: 147 mg/dL — AB (ref 65–99)
Glucose, Bld: 95 mg/dL (ref 65–99)
Potassium: 5 mmol/L (ref 3.5–5.1)
Potassium: 3 mmol/L — ABNORMAL LOW (ref 3.5–5.1)
SODIUM: 136 mmol/L (ref 135–145)
Sodium: 136 mmol/L (ref 135–145)
TOTAL PROTEIN: 7.1 g/dL (ref 6.5–8.1)
Total Bilirubin: 1.2 mg/dL (ref 0.3–1.2)
Total Protein: 7.1 g/dL (ref 6.5–8.1)

## 2014-10-26 LAB — CBC WITH DIFFERENTIAL/PLATELET
BASOS ABS: 0 10*3/uL (ref 0.0–0.1)
Basophils Relative: 1 %
EOS ABS: 0.3 10*3/uL (ref 0.0–0.7)
EOS PCT: 5 %
HCT: 35.9 % — ABNORMAL LOW (ref 39.0–52.0)
HEMOGLOBIN: 11.2 g/dL — AB (ref 13.0–17.0)
LYMPHS ABS: 0.6 10*3/uL — AB (ref 0.7–4.0)
LYMPHS PCT: 10 %
MCH: 30.9 pg (ref 26.0–34.0)
MCHC: 31.2 g/dL (ref 30.0–36.0)
MCV: 98.9 fL (ref 78.0–100.0)
Monocytes Absolute: 0.6 10*3/uL (ref 0.1–1.0)
Monocytes Relative: 10 %
NEUTROS PCT: 74 %
Neutro Abs: 4.2 10*3/uL (ref 1.7–7.7)
PLATELETS: 143 10*3/uL — AB (ref 150–400)
RBC: 3.63 MIL/uL — AB (ref 4.22–5.81)
RDW: 16.6 % — ABNORMAL HIGH (ref 11.5–15.5)
WBC: 5.7 10*3/uL (ref 4.0–10.5)

## 2014-10-26 LAB — ABO/RH: ABO/RH(D): A POS

## 2014-10-26 LAB — I-STAT TROPONIN, ED: Troponin i, poc: 0.02 ng/mL (ref 0.00–0.08)

## 2014-10-26 LAB — APTT: aPTT: 28 seconds (ref 24–37)

## 2014-10-26 LAB — GLUCOSE, CAPILLARY
GLUCOSE-CAPILLARY: 64 mg/dL — AB (ref 65–99)
GLUCOSE-CAPILLARY: 86 mg/dL (ref 65–99)
GLUCOSE-CAPILLARY: 97 mg/dL (ref 65–99)
Glucose-Capillary: 70 mg/dL (ref 65–99)
Glucose-Capillary: 73 mg/dL (ref 65–99)

## 2014-10-26 LAB — I-STAT CHEM 8, ED
BUN: 28 mg/dL — AB (ref 6–20)
CALCIUM ION: 1.08 mmol/L — AB (ref 1.13–1.30)
CHLORIDE: 101 mmol/L (ref 101–111)
CREATININE: 4.3 mg/dL — AB (ref 0.61–1.24)
GLUCOSE: 150 mg/dL — AB (ref 65–99)
HCT: 37 % — ABNORMAL LOW (ref 39.0–52.0)
Hemoglobin: 12.6 g/dL — ABNORMAL LOW (ref 13.0–17.0)
Potassium: 4.8 mmol/L (ref 3.5–5.1)
Sodium: 136 mmol/L (ref 135–145)
TCO2: 29 mmol/L (ref 0–100)

## 2014-10-26 LAB — I-STAT CG4 LACTIC ACID, ED: Lactic Acid, Venous: 1.93 mmol/L (ref 0.5–2.0)

## 2014-10-26 LAB — TROPONIN I
TROPONIN I: 0.04 ng/mL — AB (ref <0.031)
TROPONIN I: 0.06 ng/mL — AB (ref <0.031)
Troponin I: 0.07 ng/mL — ABNORMAL HIGH (ref <0.031)

## 2014-10-26 LAB — TYPE AND SCREEN
ABO/RH(D): A POS
ANTIBODY SCREEN: NEGATIVE

## 2014-10-26 LAB — BRAIN NATRIURETIC PEPTIDE: B Natriuretic Peptide: 2257.9 pg/mL — ABNORMAL HIGH (ref 0.0–100.0)

## 2014-10-26 LAB — PROTIME-INR
INR: 1.17 (ref 0.00–1.49)
PROTHROMBIN TIME: 15 s (ref 11.6–15.2)

## 2014-10-26 LAB — CBG MONITORING, ED: Glucose-Capillary: 140 mg/dL — ABNORMAL HIGH (ref 65–99)

## 2014-10-26 LAB — MRSA PCR SCREENING: MRSA by PCR: NEGATIVE

## 2014-10-26 MED ORDER — ASPIRIN EC 81 MG PO TBEC
81.0000 mg | DELAYED_RELEASE_TABLET | Freq: Every day | ORAL | Status: DC
  Administered 2014-10-27 – 2014-10-28 (×2): 81 mg via ORAL
  Filled 2014-10-26 (×4): qty 1

## 2014-10-26 MED ORDER — CINACALCET HCL 30 MG PO TABS
30.0000 mg | ORAL_TABLET | Freq: Every day | ORAL | Status: DC
  Administered 2014-10-27 – 2014-10-29 (×2): 30 mg via ORAL
  Filled 2014-10-26 (×6): qty 1

## 2014-10-26 MED ORDER — ROCURONIUM BROMIDE 50 MG/5ML IV SOLN
INTRAVENOUS | Status: AC
  Filled 2014-10-26: qty 2

## 2014-10-26 MED ORDER — SODIUM CHLORIDE 0.9 % IV SOLN
125.0000 mg | INTRAVENOUS | Status: DC
  Administered 2014-10-28: 125 mg via INTRAVENOUS
  Filled 2014-10-26 (×3): qty 10

## 2014-10-26 MED ORDER — ANTISEPTIC ORAL RINSE SOLUTION (CORINZ)
7.0000 mL | Freq: Four times a day (QID) | OROMUCOSAL | Status: DC
  Administered 2014-10-27 – 2014-10-30 (×16): 7 mL via OROMUCOSAL

## 2014-10-26 MED ORDER — ACETAMINOPHEN 160 MG/5ML PO SOLN
650.0000 mg | Freq: Four times a day (QID) | ORAL | Status: DC | PRN
  Administered 2014-10-26 – 2014-10-28 (×3): 650 mg via ORAL
  Filled 2014-10-26 (×3): qty 20.3

## 2014-10-26 MED ORDER — SODIUM CHLORIDE 0.9 % IV SOLN
25.0000 ug/h | INTRAVENOUS | Status: DC
  Administered 2014-10-26: 50 ug/h via INTRAVENOUS
  Filled 2014-10-26: qty 50

## 2014-10-26 MED ORDER — SODIUM CHLORIDE 0.9 % IV SOLN
10.0000 ug/h | INTRAVENOUS | Status: DC
  Administered 2014-10-26: 25 ug/h via INTRAVENOUS
  Filled 2014-10-26: qty 50

## 2014-10-26 MED ORDER — HYDRALAZINE HCL 20 MG/ML IJ SOLN
10.0000 mg | INTRAMUSCULAR | Status: DC | PRN
  Administered 2014-10-26: 10 mg via INTRAVENOUS
  Filled 2014-10-26: qty 1

## 2014-10-26 MED ORDER — ANTISEPTIC ORAL RINSE SOLUTION (CORINZ)
7.0000 mL | OROMUCOSAL | Status: DC
  Administered 2014-10-26 (×2): 7 mL via OROMUCOSAL

## 2014-10-26 MED ORDER — TETANUS-DIPHTH-ACELL PERTUSSIS 5-2.5-18.5 LF-MCG/0.5 IM SUSP
INTRAMUSCULAR | Status: AC
  Filled 2014-10-26: qty 0.5

## 2014-10-26 MED ORDER — CHLORHEXIDINE GLUCONATE 0.12% ORAL RINSE (MEDLINE KIT)
15.0000 mL | Freq: Two times a day (BID) | OROMUCOSAL | Status: DC
  Administered 2014-10-26 – 2014-10-30 (×9): 15 mL via OROMUCOSAL

## 2014-10-26 MED ORDER — TETANUS-DIPHTH-ACELL PERTUSSIS 5-2.5-18.5 LF-MCG/0.5 IM SUSP
0.5000 mL | Freq: Once | INTRAMUSCULAR | Status: AC
  Administered 2014-10-26: 0.5 mL via INTRAMUSCULAR

## 2014-10-26 MED ORDER — DOXERCALCIFEROL 4 MCG/2ML IV SOLN
2.0000 ug | INTRAVENOUS | Status: DC
  Administered 2014-10-28: 2 ug via INTRAVENOUS
  Filled 2014-10-26 (×3): qty 2

## 2014-10-26 MED ORDER — FENTANYL BOLUS VIA INFUSION
50.0000 ug | INTRAVENOUS | Status: DC | PRN
  Administered 2014-10-26 – 2014-10-27 (×3): 50 ug via INTRAVENOUS
  Filled 2014-10-26: qty 50

## 2014-10-26 MED ORDER — MIDAZOLAM HCL 2 MG/2ML IJ SOLN
INTRAMUSCULAR | Status: AC
  Filled 2014-10-26: qty 4

## 2014-10-26 MED ORDER — HEPARIN SODIUM (PORCINE) 5000 UNIT/ML IJ SOLN
5000.0000 [IU] | Freq: Three times a day (TID) | INTRAMUSCULAR | Status: DC
  Administered 2014-10-26 – 2014-10-30 (×12): 5000 [IU] via SUBCUTANEOUS
  Filled 2014-10-26 (×15): qty 1

## 2014-10-26 MED ORDER — LIDOCAINE HCL (PF) 1 % IJ SOLN: 5.0000 mL | INTRAMUSCULAR | Status: DC | PRN

## 2014-10-26 MED ORDER — SODIUM CHLORIDE 0.9 % IV SOLN
INTRAVENOUS | Status: DC | PRN
Start: 2014-10-26 — End: 2014-10-30

## 2014-10-26 MED ORDER — PHENYLEPHRINE HCL 10 MG/ML IJ SOLN
30.0000 ug/min | INTRAVENOUS | Status: DC
  Administered 2014-10-26: 50 ug/min via INTRAVENOUS
  Filled 2014-10-26: qty 1

## 2014-10-26 MED ORDER — MIDAZOLAM HCL 2 MG/2ML IJ SOLN
4.0000 mg | Freq: Once | INTRAMUSCULAR | Status: AC
  Administered 2014-10-26: 4 mg via INTRAVENOUS

## 2014-10-26 MED ORDER — MIDODRINE HCL 5 MG PO TABS
10.0000 mg | ORAL_TABLET | Freq: Three times a day (TID) | ORAL | Status: DC
  Administered 2014-10-26 – 2014-10-30 (×12): 10 mg via ORAL
  Filled 2014-10-26 (×17): qty 2

## 2014-10-26 MED ORDER — ETOMIDATE 2 MG/ML IV SOLN
INTRAVENOUS | Status: AC
  Administered 2014-10-26: 20 mg
  Filled 2014-10-26: qty 20

## 2014-10-26 MED ORDER — DEXTROSE 50 % IV SOLN
INTRAVENOUS | Status: AC
  Administered 2014-10-26: 25 mL
  Filled 2014-10-26: qty 50

## 2014-10-26 MED ORDER — DEXTROSE 5 % IV SOLN
30.0000 ug/min | INTRAVENOUS | Status: DC
  Administered 2014-10-26: 100 ug/min via INTRAVENOUS
  Administered 2014-10-26: 30 ug/min via INTRAVENOUS
  Administered 2014-10-26: 145 ug/min via INTRAVENOUS
  Administered 2014-10-26: 150 ug/min via INTRAVENOUS
  Administered 2014-10-27: 125 ug/min via INTRAVENOUS
  Administered 2014-10-27: 170 ug/min via INTRAVENOUS
  Administered 2014-10-27: 165 ug/min via INTRAVENOUS
  Administered 2014-10-27 – 2014-10-29 (×9): 200 ug/min via INTRAVENOUS
  Administered 2014-10-29: 170 ug/min via INTRAVENOUS
  Administered 2014-10-29: 100 ug/min via INTRAVENOUS
  Administered 2014-10-30: 175 ug/min via INTRAVENOUS
  Administered 2014-10-30: 150 ug/min via INTRAVENOUS
  Administered 2014-10-30: 50 ug/min via INTRAVENOUS
  Filled 2014-10-26 (×22): qty 4

## 2014-10-26 MED ORDER — SODIUM CHLORIDE 0.9 % IV BOLUS (SEPSIS)
1000.0000 mL | Freq: Once | INTRAVENOUS | Status: AC
  Administered 2014-10-26: 1000 mL via INTRAVENOUS

## 2014-10-26 MED ORDER — PENTAFLUOROPROP-TETRAFLUOROETH EX AERO: 1.0000 "application " | INHALATION_SPRAY | CUTANEOUS | Status: DC | PRN

## 2014-10-26 MED ORDER — DEXTROSE 10 % IV SOLN
INTRAVENOUS | Status: DC
  Administered 2014-10-26: 12:00:00 via INTRAVENOUS

## 2014-10-26 MED ORDER — PANTOPRAZOLE SODIUM 40 MG IV SOLR: 40.0000 mg | INTRAVENOUS | Status: DC

## 2014-10-26 MED ORDER — LIDOCAINE-EPINEPHRINE (PF) 2 %-1:200000 IJ SOLN
10.0000 mL | Freq: Once | INTRAMUSCULAR | Status: AC
  Administered 2014-10-26: 10 mL
  Filled 2014-10-26: qty 20

## 2014-10-26 MED ORDER — SUCCINYLCHOLINE CHLORIDE 20 MG/ML IJ SOLN
INTRAMUSCULAR | Status: AC
  Administered 2014-10-26: 100 mg
  Filled 2014-10-26: qty 1

## 2014-10-26 MED ORDER — LIDOCAINE HCL (CARDIAC) 20 MG/ML IV SOLN
INTRAVENOUS | Status: AC
  Filled 2014-10-26: qty 5

## 2014-10-26 MED ORDER — SODIUM CHLORIDE 0.9 % IV SOLN: 100.0000 mL | INTRAVENOUS | Status: DC | PRN

## 2014-10-26 MED ORDER — SODIUM CHLORIDE 0.9 % IV SOLN
2.0000 mg/h | INTRAVENOUS | Status: DC
  Administered 2014-10-26: 2 mg/h via INTRAVENOUS
  Filled 2014-10-26 (×3): qty 10

## 2014-10-26 MED ORDER — PANTOPRAZOLE SODIUM 40 MG PO PACK
40.0000 mg | PACK | ORAL | Status: DC
  Administered 2014-10-27 – 2014-10-30 (×4): 40 mg
  Filled 2014-10-26 (×6): qty 20

## 2014-10-26 MED ORDER — LIDOCAINE-PRILOCAINE 2.5-2.5 % EX CREA
1.0000 "application " | TOPICAL_CREAM | CUTANEOUS | Status: DC | PRN
  Filled 2014-10-26: qty 5

## 2014-10-26 NOTE — ED Notes (Signed)
IV Team at bedside 

## 2014-10-26 NOTE — ED Notes (Signed)
MD at bedside. 

## 2014-10-26 NOTE — Procedures (Signed)
Arterial Catheter Insertion Procedure Note [name] [id number] Jun 06, 1950  Procedure: Insertion of Arterial Catheter  Indications: Blood pressure monitoring  Procedure Details Consent: Risks of procedure as well as the alternatives and risks of each were explained to the (patient/caregiver).  Consent for procedure obtained. Time Out: Verified patient identification, verified procedure, site/side was marked, verified correct patient position, special equipment/implants available, medications/allergies/relevent history reviewed, required imaging and test results available.  Performed  Maximum sterile technique was used including antiseptics, cap, gloves, gown, hand hygiene, mask and sheet. Skin prep: Chlorhexidine; local anesthetic administered 20 gauge catheter was inserted into left radial artery using the Seldinger technique.  Evaluation Blood flow good; BP tracing good. Complications: No apparent complications.   [name] 11/12/2014

## 2014-10-26 NOTE — ED Provider Notes (Signed)
CSN: [id number]     Arrival date & time 11/09/2014  0549 History   First MD Initiated Contact with Patient 11/09/2014 [phone]     Chief Complaint  Patient presents with  . Fall  . Altered Mental Status     (Consider location/radiation/quality/duration/timing/severity/associated sxs/prior Treatment) HPI Low 5 caveat. Patient brought in by EMS after being found on the floor by nursing home staff. Patient apparently had fallen out of bed striking his head. Per EMS patient was laying in a large pool of blood. Patient with decreased responsiveness. Per EMS unknown baseline metal status. Patient has also had increased difficulty breathing. Patient has a history of end-stage renal disease as well as CHF. Past Medical History  Diagnosis Date  . ESRD (end stage renal disease) ([place]) 1996    a. BMS [place], M-W-F  . Anemia   . GERD (gastroesophageal reflux disease)   . Nonischemic cardiomyopathy ([place])     a. dx 2005;  b. 04/2009 Echo: EF  >55%;  c. 12/2013 Echo: EF 20-25%, diff HK, inflat/infsept/inf AK. Mild AS, mod MR, sev dil LA, PASP 47 mmHg.  Marland Kitchen Perirectal abscess     a. s/p I & D 2013.  Marland Kitchen Secondary hyperparathyroidism ([place])   . Chronic obstructive pulmonary disease ([place])   . Retroperitoneal mass     a. 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  . Hypotension     On midodrine for support   . CAD (coronary artery disease)     a. L and R cath 01/2014: widely patent coronary arteries with minimal non-obstructive CAD    Past Surgical History  Procedure Laterality Date  . Insertion of dialysis catheter    . Av fistula placement      Numerous fistulas placed in both arms and left leg; using rt arm fistula presently  . Examination under anesthesia  03/18/2011    Procedure: EXAM UNDER ANESTHESIA;  Surgeon: [name], MD;  Location: [facility];  Service: General;  Laterality: N/A;  exam under  anesthesia, incision and drainage of perirectal abscess  . Parathyroidectomy    . Left and right heart catheterization with coronary angiogram N/A 01/31/2014    Procedure: LEFT AND RIGHT HEART CATHETERIZATION WITH CORONARY ANGIOGRAM;  Surgeon: Blane Ohara, MD;  Location: [facility] CATH LAB;  Service: Cardiovascular;  Laterality: N/A;  . Cardiac catheterization    . Peripheral vascular catheterization N/A 09/05/2014    Procedure: Dialysis/Perma Catheter Insertion;  Surgeon: Algernon Huxley, MD;  Location: [facility] CV LAB;  Service: Cardiovascular;  Laterality: N/A;  . Peripheral vascular catheterization N/A 10/02/2014    Procedure: Dialysis/Perma Catheter Insertion;  Surgeon: Algernon Huxley, MD;  Location: [place] CV LAB;  Service: Cardiovascular;  Laterality: N/A;   Family History  Problem Relation Age of Onset  . Heart failure Mother   . Heart failure Father   . Heart attack Brother    Social History  Substance Use Topics  . Smoking status: Former Smoker -- 2.00 packs/day for 15 years    Types: Cigarettes    Quit date: 03/11/1983  . Smokeless tobacco: Never Used  . Alcohol Use: No    Review of Systems  Unable to perform ROS     Allergies  Review of patient's allergies indicates no known allergies.  Home Medications   Prior to Admission medications   Medication Sig Start Date End Date  Taking? Authorizing Provider  aspirin EC 81 MG tablet Take 81 mg by mouth daily.    Historical Provider, MD  cinacalcet (SENSIPAR) 30 MG tablet Take 30 mg by mouth daily.    Historical Provider, MD  lanthanum (FOSRENOL) 1000 MG chewable tablet Chew 1,000-2,500 mg by mouth 5 (five) times daily. Pt takes one tablet with snacks and two and one-half tablets with meals.    Historical Provider, MD  midodrine (PROAMATINE) 10 MG tablet Take 10 mg by mouth 3 (three) times daily.     Historical Provider, MD  Nutritional Supplements (FEEDING SUPPLEMENT, NEPRO CARB STEADY,) LIQD Take 237 mLs by mouth 3  (three) times daily between meals. 09/12/14   Epifanio Lesches, MD  pantoprazole (PROTONIX) 40 MG tablet Take 40 mg by mouth daily.    Historical Provider, MD   BP 150/98 mmHg  Pulse 107  Temp(Src) 97.2 F (36.2 C) (Temporal)  Resp 16  Ht 5\' 11"  (1.803 m)  SpO2 89% Physical Exam  Constitutional: He appears well-developed and well-nourished. He appears distressed.  HENT:  Head: Normocephalic.  Mouth/Throat: Oropharynx is clear and moist.  Stellate laceration above the right eyebrow. Boggy but no definite bony deformity. No mid face instability.  Eyes: EOM are normal. Pupils are equal, round, and reactive to light.  Pupils 2 mm and reactive bilaterally.  Neck:  Cervical collar in place  Cardiovascular: Regular rhythm.   Tachycardia  Pulmonary/Chest: He is in respiratory distress. He has no wheezes. He has rales.  Patient using abdominal accessory muscles with tachypnea. Rales bilaterally.  Abdominal: Soft. Bowel sounds are normal. He exhibits no distension and no mass. There is no tenderness. There is no rebound and no guarding.  Musculoskeletal: Normal range of motion. He exhibits edema. He exhibits no tenderness.  Patient with diffuse edema to the right upper and lower extremities. Left femoral dialysis access.   Neurological:  Patient will follow simple commands. He appears to be moving all of his extremities. Will answer in one word sentences though difficult to understand.  Skin: Skin is warm and dry. No rash noted. No erythema.  Nursing note and vitals reviewed.   ED Course  INTUBATION Date/Time: 11/07/2014 6:05 AM Performed by: Julianne Rice Authorized by: Lita Mains, Danny Yackley Consent: The procedure was performed in an emergent situation. Indications: respiratory failure and  airway protection Intubation method: video-assisted Preoxygenation: BVM Sedatives: etomidate Paralytic: succinylcholine Laryngoscope size: Mac 4 Tube size: 7.5 mm Tube type: cuffed Number of  attempts: 1 Cords visualized: yes Post-procedure assessment: chest rise and CO2 detector Breath sounds: equal Cuff inflated: yes ETT to lip: 24 cm Tube secured with: ETT holder Chest x-ray interpreted by me and radiologist. Chest x-ray findings: endotracheal tube in appropriate position Patient tolerance: Patient tolerated the procedure well with no immediate complications   (including critical care time) Labs Review Labs Reviewed  CBC WITH DIFFERENTIAL/PLATELET - Abnormal; Notable for the following:    RBC 3.63 (*)    Hemoglobin 11.2 (*)    HCT 35.9 (*)    RDW 16.6 (*)    Platelets 143 (*)    Lymphs Abs 0.6 (*)    All other components within normal limits  COMPREHENSIVE METABOLIC PANEL - Abnormal; Notable for the following:    Chloride 96 (*)    Glucose, Bld 147 (*)    BUN 22 (*)    Creatinine, Ser 4.87 (*)    Albumin 3.1 (*)    GFR calc non Af Amer 12 (*)    GFR  calc Af Amer 13 (*)    All other components within normal limits  I-STAT CHEM 8, ED - Abnormal; Notable for the following:    BUN 28 (*)    Creatinine, Ser 4.30 (*)    Glucose, Bld 150 (*)    Calcium, Ion 1.08 (*)    Hemoglobin 12.6 (*)    HCT 37.0 (*)    All other components within normal limits  CBG MONITORING, ED - Abnormal; Notable for the following:    Glucose-Capillary 140 (*)    All other components within normal limits  I-STAT ARTERIAL BLOOD GAS, ED - Abnormal; Notable for the following:    pH, Arterial 7.336 (*)    pCO2 arterial 60.1 (*)    pO2, Arterial 419.0 (*)    Bicarbonate 32.3 (*)    Acid-Base Excess 5.0 (*)    All other components within normal limits  PROTIME-INR  APTT  BRAIN NATRIURETIC PEPTIDE  BLOOD GAS, ARTERIAL  I-STAT CG4 LACTIC ACID, ED  I-STAT TROPOININ, ED  TYPE AND SCREEN  ABO/RH    Imaging Review Ct Head Wo Contrast  11/23/2014   CLINICAL DATA:  Patient found unresponsive with bleeding from right side of head. Chronic renal failure  EXAM: CT HEAD WITHOUT CONTRAST  CT  CERVICAL SPINE WITHOUT CONTRAST  TECHNIQUE: Multidetector CT imaging of the head and cervical spine was performed following the standard protocol without intravenous contrast. Multiplanar CT image reconstructions of the cervical spine were also generated.  COMPARISON:  Head CT August 26, 2014  FINDINGS: CT HEAD FINDINGS  Mild diffuse atrophy is stable. There is no intracranial mass, hemorrhage, extra-axial fluid collection, or midline shift. There is patchy small vessel disease in the centra semiovale bilaterally. There is small vessel disease in the pons with several tiny lacunar type infarcts in this area. No acute infarct is evident on this study.  There is a a sizable midline frontal scalp hematoma. The bony calvarium appears intact. The mastoid air cells are clear. There is an air-fluid level in the right frontal sinus. There is opacification of several anterior ethmoid air cells. There is also mucosal thickening in each maxillary antrum.  CT CERVICAL SPINE FINDINGS  There is no fracture or spondylolisthesis. Prevertebral soft tissues and predental space regions are normal.  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 There is extensive bony overgrowth this area with severe spinal stenosis throughout the cervical spine, most rim attic at C3-4 and C4-5. There is moderate disc space narrowing at C6-7 and C7-T1. There is milder narrowing at other levels. There are prominent anterior osteophytes at C3, C4, and C5. There is no disc extrusion.  There is widespread vascular calcification.  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  IMPRESSION: CT head: Atrophy with supratentorial infratentorial small vessel disease. No acute infarct evident. No intracranial mass, hemorrhage, or acute appearing infarct. Areas of paranasal sinus disease with air-fluid level in the right frontal sinus.  No fractures evident. Frontal scalp hematoma.  CT cervical spine: No fracture or spondylolisthesis. 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 There is moderate spinal stenosis at multiple other levels due to the extensive bony overgrowth in this area.  There are bony changes of secondary hyperparathyroidism/chronic renal failure.  Multilevel osteoarthritic change.  Extensive calcification of multiple vascular structures.   Electronically Signed   By: Lowella Grip III M.D.  On: 11/22/2014 07:12   Ct Cervical Spine Wo Contrast  11/09/2014   CLINICAL DATA:  Patient found unresponsive with bleeding from right side of head. Chronic renal failure  EXAM: CT HEAD WITHOUT CONTRAST  CT CERVICAL SPINE WITHOUT CONTRAST  TECHNIQUE: Multidetector CT imaging of the head and cervical spine was performed following the standard protocol without intravenous contrast. Multiplanar CT image reconstructions of the cervical spine were also generated.  COMPARISON:  Head CT August 26, 2014  FINDINGS: CT HEAD FINDINGS  Mild diffuse atrophy is stable. There is no intracranial mass, hemorrhage, extra-axial fluid collection, or midline shift. There is patchy small vessel disease in the centra semiovale bilaterally. There is small vessel disease in the pons with several tiny lacunar type infarcts in this area. No acute infarct is evident on this study.  There is a a sizable midline frontal scalp hematoma. The bony calvarium appears intact. The mastoid air cells are clear. There is an air-fluid level in the right frontal sinus. There is opacification of several anterior ethmoid air cells. There is also mucosal thickening in each maxillary antrum.  CT CERVICAL SPINE FINDINGS  There is no fracture or spondylolisthesis. Prevertebral soft tissues and predental space regions are normal.  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  There is extensive bony overgrowth this area with severe spinal stenosis throughout the cervical spine, most rim attic at C3-4 and C4-5. There is moderate disc space narrowing at C6-7 and C7-T1. There is milder narrowing at other levels. There are prominent anterior osteophytes at C3, C4, and C5. There is no disc extrusion.  There is widespread vascular calcification.  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  IMPRESSION: CT head: Atrophy with supratentorial infratentorial small vessel disease. No acute infarct evident. No intracranial mass, hemorrhage, or acute appearing infarct. Areas of paranasal sinus disease with air-fluid level in the right frontal sinus. No fractures evident. Frontal scalp hematoma.  CT cervical spine: No fracture or spondylolisthesis. 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 There is moderate spinal stenosis at multiple other levels due to the extensive bony overgrowth in this area.  There are bony changes of secondary hyperparathyroidism/chronic renal failure.  Multilevel osteoarthritic change.  Extensive calcification of multiple vascular structures.   Electronically Signed   By: Lowella Grip III M.D.   On: 11/15/2014 07:12   Dg Chest Port 1 View  11/13/2014   CLINICAL DATA:  Found unresponsive. History of end-stage renal disease, anemia, hypotension.  EXAM: PORTABLE CHEST 1 VIEW  COMPARISON:  Chest radiograph September 05, 2014  FINDINGS: Endotracheal tube distal tip projects 2.2 cm above the carina. The cardiac silhouette is moderately enlarged. Pulmonary vascular congestion interstitial prominence consistent with pulmonary edema without pleural effusion or focal consolidation. No pneumothorax though lung apices incompletely imaged.  Dialysis catheter via inferior vena cava approach. Vascular stent in the RIGHT arm partially imaged. Vascular  calcifications in LEFT arm.  IMPRESSION: Similar cardiomegaly.  Interstitial edema.  Endotracheal tube tip projects 3.2 cm above the carina.   Electronically Signed   By: Elon Alas M.D.   On: 11/15/2014 06:20   I have personally reviewed and evaluated these images and lab results as part of my medical decision-making.   EKG Interpretation None     CRITICAL CARE Performed by: Lita Mains, Liese Dizdarevic Total critical care time: 35 min Critical care time was exclusive  of separately billable procedures and treating other patients. Critical care was necessary to treat or prevent imminent or life-threatening deterioration. Critical care was time spent personally by me on the following activities: development of treatment plan with patient and/or surrogate as well as nursing, discussions with consultants, evaluation of patient's response to treatment, examination of patient, obtaining history from patient or surrogate, ordering and performing treatments and interventions, ordering and review of laboratory studies, ordering and review of radiographic studies, pulse oximetry and re-evaluation of patient's condition.  MDM   Final diagnoses:  Closed head injury, initial encounter  Scalp laceration, initial encounter  Acute pulmonary edema ([place])  Acute respiratory failure with hypoxia ([place])    She presents from nursing home after being found in the floor with an obvious head injury. Appears from old records patient is normally ambulatory and verbal with no history of dementia. Altered mental status with respiratory distress. Decision made to intubate the emergency department.  Discussed with critical care and will admit.  Julianne Rice, MD 11/15/2014 [phone]

## 2014-10-26 NOTE — Progress Notes (Signed)
RN was made aware by HD nurse that pt right foot and leg was twitching. RN notified Luz Brazen, MD, and orders for [facility] ordered

## 2014-10-26 NOTE — Progress Notes (Addendum)
Initial Nutrition Assessment  DOCUMENTATION CODES:  Not applicable  INTERVENTION:  If unable to extubate, medically able and in line with goals of care, recommend TF via NG/OGT with Vital AF 1.2 at 25 ml/h and Prostat 30 ml BID on day 1; on day 2 Increase Prostat to 60 ml BID and increase TF to goal rate of 55 ml/h (1320 ml per day) to provide 1984 kcals, 159 gm protein, 1070 ml free water daily.  NUTRITION DIAGNOSIS:  Inadequate oral intake related to inability to eat as evidenced by NPO status.  GOAL:  Patient will meet greater than or equal to 90% of their needs  MONITOR:  Vent status, Diet advancement, Skin, TF tolerance, Weight trends, Labs, I & O's, Goals of care  REASON FOR ASSESSMENT:  Ventilator    ASSESSMENT:  64 yo male PMHx COPDm CAD, HTN, GERD, Anemia, ESRD on HD. He suffered fall at [facility] and suffered head injury/laceration. Intubated for airway protection. CXR with pulmonary edema.  Pt intubated, no historians present to obtain wt/diet hx.   No weight on file. Will use most recent weight 10/02/14.   NFPE: WDl  Patient is currently intubated on ventilator support MV: 10 L/min Temp (24hrs), Avg:98.4 F (36.9 C), Min:97.2 F (36.2 C), Max:99.5 F (37.5 C)  Propofol: None  Diet Order:  Diet NPO time specified  Skin:  Laceration head  Last BM:  Unknown  Height:  Ht Readings from Last 1 Encounters:  10/27/2014 5\' 11"  (1.803 m)   Weight:  Wt Readings from Last 1 Encounters:  10/02/14 207 lb (93.895 kg)   Wt Readings from Last 10 Encounters:  10/02/14 207 lb (93.895 kg)  09/11/14 213 lb 10 oz (96.9 kg)  08/27/14 223 lb (101.152 kg)  02/14/14 238 lb (107.956 kg)  01/31/14 231 lb (104.781 kg)  01/15/14 238 lb 3.2 oz (108.047 kg)  01/09/14 231 lb 12.8 oz (105.144 kg)  12/25/13 243 lb (110.224 kg)  12/04/13 243 lb 12.8 oz (110.587 kg)  10/12/13 254 lb (115.214 kg)   Ideal Body Weight:  78.18 kg  BMI:  Using Height 10/02/14. BMI: 28.9  Estimated  Nutritional Needs:  Kcal:  2056 kg  Protein:  >156 (2g/kg BW) Fluid:  Per MD  EDUCATION NEEDS:  No education needs identified at this time  Burtis Junes RD, LDN Nutrition Pager: [phone] 11/21/2014 2:32 PM

## 2014-10-26 NOTE — Consult Note (Signed)
[facility] Renal Consultation Note    Indication for Consultation:  Management of ESRD/hemodialysis; anemia, hypertension/volume and secondary hyperparathyroidism PCP:  Pt has recently been residing at [facility]  HPI: [name] is a 64 y.o. 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  Since that time, he has been FTT with gradual lowering of EDW, more confusion and obvious weight loss.  He has significant RUE swelling and right fem TDC. He reportedly fell out of bed at his nursing home today and was transferred to [facility] for evaluation and treatment.  Head CT showedno acute infarct or hemorrhage, frontal scap hematoma, severe C3-4 stenosis. K 5 (surprising given he had HD yesterday), Ca 9.1 Alb 3.1 BNP 2258 Hgb 11.2 WBC 5.7 diff ok, and platelets 143 CXR showed pulmonary edema, no effusions or consolidation.  His last HD was Friday 9/30 with net UF of 0.9 - pt had EDW lwoered Monday to 90.5, but actually left at 90.1.  On Wed and Friday he came in below edw and left lower, but on call providers were not notified.  Post HD weight Friday was 88.2 with BP 110/60 P 69 after rinse back.  He runs chronically low BP often into 80s.- pre HD BP are usually in the 90s.  Outpt Hgb has been stable at 11.5 with recent drop in platelets to 125.  Spoke with his wife, she actually had planned to take him home from the NH, but didn't have all the supplies.  She hadn't thought about code status, but knows he would not want to be on life-support (she didn't realize he currently is on life support).  She recognizes that his quality of life is not what he would want it to be, that he has significantly declined and that his  functional status is unlikely to improve.   Past Medical History  Diagnosis Date  . ESRD (end stage renal disease) ([place]) 1996    a. BMS [place], M-W-F  . Anemia   . GERD (gastroesophageal reflux disease)   . Nonischemic cardiomyopathy ([place])     a. dx 2005;  b. 04/2009 Echo: EF  >55%;  c. 12/2013 Echo: EF 20-25%, diff HK, inflat/infsept/inf AK. Mild AS, mod MR, sev dil LA, PASP 47 mmHg.  Marland Kitchen Perirectal abscess     a. s/p I & D 2013.  Marland Kitchen Secondary hyperparathyroidism ([place])   . Chronic obstructive pulmonary disease ([place])   . Retroperitoneal mass     a. 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  . Hypotension     On midodrine for support   . CAD (coronary artery disease)     a. L and R cath 01/2014: widely patent coronary arteries with minimal non-obstructive CAD    Past Surgical History  Procedure Laterality Date  . Insertion of dialysis catheter    . Av fistula placement      Numerous fistulas placed in both arms and left leg; using rt arm fistula presently  . Examination under anesthesia  03/18/2011    Procedure: EXAM UNDER ANESTHESIA;  Surgeon: [name], MD;  Location: [place];  Service: General;  Laterality: N/A;  exam under anesthesia,  incision and drainage of perirectal abscess  . Parathyroidectomy    . Left and right heart catheterization with coronary angiogram N/A 01/31/2014    Procedure: LEFT AND RIGHT HEART CATHETERIZATION WITH CORONARY ANGIOGRAM;  Surgeon: Blane Ohara, MD;  Location: [facility] CATH LAB;  Service: Cardiovascular;  Laterality: N/A;  . Cardiac catheterization    . Peripheral vascular catheterization N/A 09/05/2014    Procedure: Dialysis/Perma Catheter Insertion;  Surgeon: Algernon Huxley, MD;  Location: [place] CV LAB;  Service: Cardiovascular;  Laterality: N/A;  . Peripheral vascular catheterization N/A 10/02/2014    Procedure: Dialysis/Perma Catheter Insertion;   Surgeon: Algernon Huxley, MD;  Location: [place] CV LAB;  Service: Cardiovascular;  Laterality: N/A;   Family History  Problem Relation Age of Onset  . Heart failure Mother   . Heart failure Father   . Heart attack Brother    Social History:  reports that he quit smoking about 31 years ago. His smoking use included Cigarettes. He has a 30 pack-year smoking history. He has never used smokeless tobacco. He reports that he does not drink alcohol or use illicit drugs. No Known Allergies Prior to Admission medications   Medication Sig Start Date End Date Taking? Authorizing Provider  aspirin EC 81 MG tablet Take 81 mg by mouth daily.    Historical Provider, MD  cinacalcet (SENSIPAR) 30 MG tablet Take 30 mg by mouth daily.    Historical Provider, MD  lanthanum (FOSRENOL) 1000 MG chewable tablet Chew 1,000-2,500 mg by mouth 5 (five) times daily. Pt takes one tablet with snacks and two and one-half tablets with meals.    Historical Provider, MD  midodrine (PROAMATINE) 10 MG tablet Take 10 mg by mouth 3 (three) times daily.     Historical Provider, MD  Nutritional Supplements (FEEDING SUPPLEMENT, NEPRO CARB STEADY,) LIQD Take 237 mLs by mouth 3 (three) times daily between meals. 09/12/14   Epifanio Lesches, MD  pantoprazole (PROTONIX) 40 MG tablet Take 40 mg by mouth daily.    Historical Provider, MD   Current Facility-Administered Medications  Medication Dose Route Frequency Provider Last Rate Last Dose  . antiseptic oral rinse solution (CORINZ)  7 mL Mouth Rinse 10 times per day Chesley Mires, MD      . aspirin EC tablet 81 mg  81 mg Oral Daily Chesley Mires, MD      . chlorhexidine gluconate (PERIDEX) 0.12 % solution 15 mL  15 mL Mouth Rinse BID Chesley Mires, MD      . cinacalcet (SENSIPAR) tablet 30 mg  30 mg Oral Daily Chesley Mires, MD      . fentaNYL (SUBLIMAZE) 2,500 mcg in sodium chloride 0.9 % 250 mL (10 mcg/mL) infusion  25-400 mcg/hr Intravenous Continuous Chesley Mires, MD 10 mL/hr at  11/23/2014 0929 100 mcg/hr at 11/25/2014 0929  . fentaNYL (SUBLIMAZE) bolus via infusion 50 mcg  50 mcg Intravenous Q1H PRN Chesley Mires, MD   50 mcg at 11/06/2014 0858  . heparin injection 5,000 Units  5,000 Units Subcutaneous 3 times per day Chesley Mires, MD      . hydrALAZINE (APRESOLINE) injection 10 mg  10 mg Intravenous Q4H PRN Chesley Mires, MD   10 mg at 11/05/2014 0851  . lidocaine (cardiac) 100 mg/76ml (XYLOCAINE) 20 MG/ML injection 2%           . midazolam (VERSED) 50 mg in sodium chloride 0.9 % 50 mL (1 mg/mL) infusion  2 mg/hr Intravenous Continuous Julianne Rice, MD 2 mL/hr at  11/08/2014 0748 2 mg/hr at 11/08/2014 0748  . pantoprazole sodium (PROTONIX) 40 mg/20 mL oral suspension 40 mg  40 mg Per Tube Q24H Chesley Mires, MD      . rocuronium (ZEMURON) 50 MG/5ML injection           . Tdap (BOOSTRIX) 5-2.5-18.5 LF-MCG/0.5 injection            Current Outpatient Prescriptions  Medication Sig Dispense Refill  . aspirin EC 81 MG tablet Take 81 mg by mouth daily.    . cinacalcet (SENSIPAR) 30 MG tablet Take 30 mg by mouth daily.    Marland Kitchen lanthanum (FOSRENOL) 1000 MG chewable tablet Chew 1,000-2,500 mg by mouth 5 (five) times daily. Pt takes one tablet with snacks and two and one-half tablets with meals.    . midodrine (PROAMATINE) 10 MG tablet Take 10 mg by mouth 3 (three) times daily.     . Nutritional Supplements (FEEDING SUPPLEMENT, NEPRO CARB STEADY,) LIQD Take 237 mLs by mouth 3 (three) times daily between meals. 30 Can 0  . pantoprazole (PROTONIX) 40 MG tablet Take 40 mg by mouth daily.     Labs: Basic Metabolic Panel:  Recent Labs Lab 10/29/2014 0624 11/23/2014 0625  NA 136 136  K 4.8 5.0  CL 101 96*  CO2  --  29  GLUCOSE 150* 147*  BUN 28* 22*  CREATININE 4.30* 4.87*  CALCIUM  --  9.1   Liver Function Tests:  Recent Labs Lab 10/29/2014 0625  AST 25  ALT 17  ALKPHOS 98  BILITOT 1.2  PROT 7.1  ALBUMIN 3.1*  CBC:  Recent Labs Lab 11/13/2014 0624 11/07/2014 0625  WBC  --  5.7   NEUTROABS  --  4.2  HGB 12.6* 11.2*  HCT 37.0* 35.9*  MCV  --  98.9  PLT  --  143*   CBG:  Recent Labs Lab 11/18/2014 0606  GLUCAP 140*   Studies/Results: Ct Head Wo Contrast  11/03/2014   CLINICAL DATA:  Patient found unresponsive with bleeding from right side of head. Chronic renal failure  EXAM: CT HEAD WITHOUT CONTRAST  CT CERVICAL SPINE WITHOUT CONTRAST  TECHNIQUE: Multidetector CT imaging of the head and cervical spine was performed following the standard protocol without intravenous contrast. Multiplanar CT image reconstructions of the cervical spine were also generated.  COMPARISON:  Head CT August 26, 2014  FINDINGS: CT HEAD FINDINGS  Mild diffuse atrophy is stable. There is no intracranial mass, hemorrhage, extra-axial fluid collection, or midline shift. There is patchy small vessel disease in the centra semiovale bilaterally. There is small vessel disease in the pons with several tiny lacunar type infarcts in this area. No acute infarct is evident on this study.  There is a a sizable midline frontal scalp hematoma. The bony calvarium appears intact. The mastoid air cells are clear. There is an air-fluid level in the right frontal sinus. There is opacification of several anterior ethmoid air cells. There is also mucosal thickening in each maxillary antrum.  CT CERVICAL SPINE FINDINGS  There is no fracture or spondylolisthesis. Prevertebral soft tissues and predental space regions are normal.  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 There is extensive bony overgrowth this area with severe spinal stenosis throughout the cervical spine, most rim attic at C3-4 and C4-5. There is moderate disc space narrowing at C6-7 and C7-T1. There is milder narrowing at other levels. There are prominent anterior osteophytes at C3, C4, and C5. There  is no disc extrusion.  There is widespread vascular calcification.  There is erosion in the medial clavicles  bilaterally, likely due to secondary hyperparathyroidism from chronic renal failure.  IMPRESSION: CT head: Atrophy with supratentorial infratentorial small vessel disease. No acute infarct evident. No intracranial mass, hemorrhage, or acute appearing infarct. Areas of paranasal sinus disease with air-fluid level in the right frontal sinus. No fractures evident. Frontal scalp hematoma.  CT cervical spine: No fracture or spondylolisthesis. 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 There is moderate spinal stenosis at multiple other levels due to the extensive bony overgrowth in this area.  There are bony changes of secondary hyperparathyroidism/chronic renal failure.  Multilevel osteoarthritic change.  Extensive calcification of multiple vascular structures.   Electronically Signed   By: Lowella Grip III M.D.   On: 11/08/2014 07:12   Ct Cervical Spine Wo Contrast  11/22/2014   CLINICAL DATA:  Patient found unresponsive with bleeding from right side of head. Chronic renal failure  EXAM: CT HEAD WITHOUT CONTRAST  CT CERVICAL SPINE WITHOUT CONTRAST  TECHNIQUE: Multidetector CT imaging of the head and cervical spine was performed following the standard protocol without intravenous contrast. Multiplanar CT image reconstructions of the cervical spine were also generated.  COMPARISON:  Head CT August 26, 2014  FINDINGS: CT HEAD FINDINGS  Mild diffuse atrophy is stable. There is no intracranial mass, hemorrhage, extra-axial fluid collection, or midline shift. There is patchy small vessel disease in the centra semiovale bilaterally. There is small vessel disease in the pons with several tiny lacunar type infarcts in this area. No acute infarct is evident on this study.  There is a a sizable midline frontal scalp hematoma. The bony calvarium appears intact. The mastoid air cells are clear. There is an air-fluid level in the right frontal sinus. There is  opacification of several anterior ethmoid air cells. There is also mucosal thickening in each maxillary antrum.  CT CERVICAL SPINE FINDINGS  There is no fracture or spondylolisthesis. Prevertebral soft tissues and predental space regions are normal.  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 There is extensive bony overgrowth this area with severe spinal stenosis throughout the cervical spine, most rim attic at C3-4 and C4-5. There is moderate disc space narrowing at C6-7 and C7-T1. There is milder narrowing at other levels. There are prominent anterior osteophytes at C3, C4, and C5. There is no disc extrusion.  There is widespread vascular calcification.  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  IMPRESSION: CT head: Atrophy with supratentorial infratentorial small vessel disease. No acute infarct evident. No intracranial mass, hemorrhage, or acute appearing infarct. Areas of paranasal sinus disease with air-fluid level in the right frontal sinus. No fractures evident. Frontal scalp hematoma.  CT cervical spine: No fracture or spondylolisthesis. 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 There is moderate spinal stenosis at multiple other levels due to the extensive bony overgrowth in this area.  There are bony changes of secondary hyperparathyroidism/chronic renal failure.  Multilevel osteoarthritic change.  Extensive calcification of multiple vascular structures.   Electronically Signed   By: Lowella Grip III M.D.   On: 11/09/2014 07:12   Dg Chest Port 1 View  10/27/2014   CLINICAL DATA:  Found unresponsive. History of end-stage renal disease, anemia, hypotension.  EXAM: PORTABLE CHEST 1 VIEW  COMPARISON:  Chest radiograph September 05, 2014  FINDINGS: Endotracheal tube distal tip projects 2.2 cm above the carina.  The cardiac silhouette is moderately enlarged. Pulmonary vascular congestion interstitial prominence consistent with pulmonary edema without pleural effusion or focal consolidation. No pneumothorax though lung apices incompletely imaged.  Dialysis catheter via inferior vena cava approach. Vascular stent in the RIGHT arm partially imaged. Vascular calcifications in LEFT arm.  IMPRESSION: Similar cardiomegaly.  Interstitial edema.  Endotracheal tube tip projects 3.2 cm above the carina.   Electronically Signed   By: Elon Alas M.D.   On: 11/07/2014 06:20   ROS: As per HPI; intake has been poor with dysphagia issues, LE edema and RUE edema (access followed by Dr. Lucky Cowboy), fell in August too (net HEad CT), ambulate with walker, increased difficulty expressing himself   Physical Exam: Filed Vitals:   11/24/2014 0900 11/22/2014 0916 11/07/2014 0930 11/20/2014 0931  BP: 220/199 168/114 147/92   Pulse: 85 87 93   Temp:    99.5 F (37.5 C)  TempSrc:    Axillary  Resp: 16 14 18    Height:      SpO2: 99% 98% 100%      General:  intubated Head:  Frontal laceration with hematoma Neck: Supple. JVD not elevated. Lungs: coarse BS on vent Heart: RRR 2/6 murmur Abdomen: Soft, non-tender, non-distended with normoactive bowel sounds. No rebound/guarding.  Lower extremities:  No sig LE edema Neuro: sedated, moving feet a little Dialysis Access left fem TDC and right lower AVF + bruit RUE edema- improved somewhat from when I saw him Monday  Dialysis Orders: Center: MWF [facility] 4.5 180 EDW 90.5 but last post HD weight was 88.2 2 K 2.25 Ca profile 2 right fem cath, right lower AVF maturing heparin 1000 intermittent 2000 mid tmt Hectorol 4 Mircera 50 q 5 weeks - last given 9/14 Recent labs:  Hgb 11.5 9/28 17% sat ferritin 953 July platelets 125 iPTH 107 Ca 10 P 5.1  Assessment/Plan: 1. Acute respiratory failure secondary to pulmonary edema /intubated- plan extra HD today and would try to extubate with plans not to  reintubate if necessary 2. Fever temp 99.5 WBC with normal diff- since he has TDC, would check BC x 2 on HD today 3. ESRD -  MWF - K 5 today surprising given HD yesterday for 4.5 hour on 2 K bath- del kt/v was 1.73 4. Chronic hypotension/volume  - on midodrine - lower volume - keep BP >80; BP unusually elevated- likely not accurate 5. Anemia  - stable- tsat low - start short course Fe if dialysis is continued. 6. Metabolic bone disease -  S/p parathyroidectomy 2002  iPTH 107 08/2014 Ca ~10 as outpt- decrease hectorol to 2 starting next week., on low dose sensipar, hold on binders for now 7. Nutrition - poor losing weight, Alb low 8. CM EF 15-20% 08/2014 /AS/MR 9. FTT/Goals of care - he has been declining with some problems that are not fixable.  Palliative care consult/goals of care conversation would be appropriate.  Have begun this conversation with his wife today.  Myriam Jacobson, PA-C [place] [phone] 11/09/2014, 9:35 AM

## 2014-10-26 NOTE — Progress Notes (Signed)
Pt transported to CT without complication.

## 2014-10-26 NOTE — Progress Notes (Signed)
Dr. Danise Mina was made aware that Pt K+ is 3. Pt is currently undergoing HD at bedside. HD nurse change dialysate to high K+. MD given orders to check CMP and Mg one hour after HD finishes.

## 2014-10-26 NOTE — ED Notes (Signed)
Hannah PA at bedside suturing pt.

## 2014-10-26 NOTE — Progress Notes (Signed)
Critical ABG reported to Dr. Lita Mains.

## 2014-10-26 NOTE — ED Notes (Signed)
Dr Llana Aliment at bedside.

## 2014-10-26 NOTE — Progress Notes (Signed)
Dr Halford Chessman made aware of patients blood pressures. Ordered arterial line per RT

## 2014-10-26 NOTE — H&P (Signed)
PULMONARY / CRITICAL CARE MEDICINE   Name: [name] MRN: [id number] DOB: [birth year]    ADMISSION DATE:  11/15/2014  REFERRING MD :  ER  CHIEF COMPLAINT:  Syncope  INITIAL PRESENTATION:  64 yo male from NH with syncope, superficial head injury.  Intubated for airway protection.  CXR with pulmonary edema.  He has hx of ESRD on HD.  STUDIES:  10/01 CT head >> mild diffuse atrophy, frontal scalp hematoma 10/01 CT neck >> C3-C4 spinal stenosis  SIGNIFICANT EVENTS: 10/01 Admit, renal consulted   HISTORY OF PRESENT ILLNESS:   Hx from ER staff and pt's wife.  He was in [facility] recently for placement of Lt femoral vein tunneled HD cath on 9/07.  He was discharged to rehab.  He is followed by Dr. Florene Glen for ESRD, and Dr. Johnsie Cancel for non ischemic cardiomyopathy.  He was reported to have fallen out of bed this AM.  He was found in pool of blood with laceration over Lt frontal area.  CT head/neck did not show any acute injuries.  He was somnolent on arrival to ER and intubated for airway protection.  His wife was not aware of any particular complaints over the past few days.  PAST MEDICAL HISTORY :   has a past medical history of ESRD (end stage renal disease) ([place]) (1996); Anemia; GERD (gastroesophageal reflux disease); Nonischemic cardiomyopathy ([place]); Perirectal abscess; Secondary hyperparathyroidism ([place]); Chronic obstructive pulmonary disease ([place]); Retroperitoneal mass; Hypotension; and CAD (coronary artery disease).  has past surgical history that includes Insertion of dialysis catheter; AV fistula placement; Examination under anesthesia (03/18/2011); Parathyroidectomy; left and right heart catheterization with coronary angiogram (N/A, 01/31/2014); Cardiac catheterization; Cardiac catheterization (N/A, 09/05/2014); and Cardiac catheterization (N/A, 10/02/2014). Prior to Admission medications   Medication Sig Start Date End Date Taking? Authorizing Provider  aspirin EC 81 MG tablet Take 81  mg by mouth daily.    Historical Provider, MD  cinacalcet (SENSIPAR) 30 MG tablet Take 30 mg by mouth daily.    Historical Provider, MD  lanthanum (FOSRENOL) 1000 MG chewable tablet Chew 1,000-2,500 mg by mouth 5 (five) times daily. Pt takes one tablet with snacks and two and one-half tablets with meals.    Historical Provider, MD  midodrine (PROAMATINE) 10 MG tablet Take 10 mg by mouth 3 (three) times daily.     Historical Provider, MD  Nutritional Supplements (FEEDING SUPPLEMENT, NEPRO CARB STEADY,) LIQD Take 237 mLs by mouth 3 (three) times daily between meals. 09/12/14   Epifanio Lesches, MD  pantoprazole (PROTONIX) 40 MG tablet Take 40 mg by mouth daily.    Historical Provider, MD   No Known Allergies  FAMILY HISTORY:  indicated that his mother is deceased. He indicated that his father is deceased.  SOCIAL HISTORY:  reports that he quit smoking about 31 years ago. His smoking use included Cigarettes. He has a 30 pack-year smoking history. He has never used smokeless tobacco. He reports that he does not drink alcohol or use illicit drugs.  REVIEW OF SYSTEMS:   Unable to obtain.  SUBJECTIVE:   VITAL SIGNS: Temp:  [97.2 F (36.2 C)] 97.2 F (36.2 C) (10/01 0614) Pulse Rate:  [82-107] 82 (10/01 0753) Resp:  [16-38] 22 (10/01 0753) BP: (84-167)/(65-149) 167/149 mmHg (10/01 0745) SpO2:  [89 %-100 %] 100 % (10/01 0753) FiO2 (%):  [50 %-100 %] 50 % (10/01 0753) HEMODYNAMICS:   VENTILATOR SETTINGS: Vent Mode:  [-] PRVC FiO2 (%):  [50 %-100 %] 50 % Set Rate:  [69  bmp-22 bmp] 22 bmp Vt Set:  [580 mL-600 mL] 600 mL PEEP:  [5 cmH20] 5 cmH20 Plateau Pressure:  [23 XBD53-29 cmH20] 23 cmH20 INTAKE / OUTPUT: No intake or output data in the 24 hours ending 11/10/2014 0826  PHYSICAL EXAMINATION: General: neck collar on Neuro:  Follows commands, moves all extremities HEENT:  Wound dressing over Lt frontal area, pupils reactive, ETT in place Cardiovascular:  Regular, no murmur Lungs:   B/l crackles Abdomen:  Soft, non tender Musculoskeletal:  Lt femoral HD cath site clean, 1+ edema, old AV grafts in arms Skin:  Chronic venous stasis changes  LABS:  CBC  Recent Labs Lab 11/06/2014 0624 11/09/2014 0625  WBC  --  5.7  HGB 12.6* 11.2*  HCT 37.0* 35.9*  PLT  --  143*   Coag's  Recent Labs Lab 10/30/2014 0625  APTT 28  INR 1.17   BMET  Recent Labs Lab 11/10/2014 0624 11/19/2014 0625  NA 136 136  K 4.8 5.0  CL 101 96*  CO2  --  29  BUN 28* 22*  CREATININE 4.30* 4.87*  GLUCOSE 150* 147*   Electrolytes  Recent Labs Lab 11/17/2014 0625  CALCIUM 9.1   Sepsis Markers  Recent Labs Lab 11/04/2014 0623  LATICACIDVEN 1.93   ABG  Recent Labs Lab 11/07/2014 0658  PHART 7.336*  PCO2ART 60.1*  PO2ART 419.0*   Liver Enzymes  Recent Labs Lab 11/21/2014 0625  AST 25  ALT 17  ALKPHOS 98  BILITOT 1.2  ALBUMIN 3.1*   Cardiac Enzymes No results for input(s): TROPONINI, PROBNP in the last 168 hours.   Glucose  Recent Labs Lab 11/14/2014 0606  GLUCAP 140*    Imaging Ct Head Wo Contrast  11/18/2014   CLINICAL DATA:  Patient found unresponsive with bleeding from right side of head. Chronic renal failure  EXAM: CT HEAD WITHOUT CONTRAST  CT CERVICAL SPINE WITHOUT CONTRAST  TECHNIQUE: Multidetector CT imaging of the head and cervical spine was performed following the standard protocol without intravenous contrast. Multiplanar CT image reconstructions of the cervical spine were also generated.  COMPARISON:  Head CT August 26, 2014  FINDINGS: CT HEAD FINDINGS  Mild diffuse atrophy is stable. There is no intracranial mass, hemorrhage, extra-axial fluid collection, or midline shift. There is patchy small vessel disease in the centra semiovale bilaterally. There is small vessel disease in the pons with several tiny lacunar type infarcts in this area. No acute infarct is evident on this study.  There is a a sizable midline frontal scalp hematoma. The bony calvarium  appears intact. The mastoid air cells are clear. There is an air-fluid level in the right frontal sinus. There is opacification of several anterior ethmoid air cells. There is also mucosal thickening in each maxillary antrum.  CT CERVICAL SPINE FINDINGS  There is no fracture or spondylolisthesis. Prevertebral soft tissues and predental space regions are normal.  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 There is extensive bony overgrowth this area with severe spinal stenosis throughout the cervical spine, most rim attic at C3-4 and C4-5. There is moderate disc space narrowing at C6-7 and C7-T1. There is milder narrowing at other levels. There are prominent anterior osteophytes at C3, C4, and C5. There is no disc extrusion.  There is widespread vascular calcification.  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  IMPRESSION: CT head: Atrophy with supratentorial infratentorial small vessel disease.  No acute infarct evident. No intracranial mass, hemorrhage, or acute appearing infarct. Areas of paranasal sinus disease with air-fluid level in the right frontal sinus. No fractures evident. Frontal scalp hematoma.  CT cervical spine: No fracture or spondylolisthesis. 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 There is moderate spinal stenosis at multiple other levels due to the extensive bony overgrowth in this area.  There are bony changes of secondary hyperparathyroidism/chronic renal failure.  Multilevel osteoarthritic change.  Extensive calcification of multiple vascular structures.   Electronically Signed   By: Lowella Grip III M.D.   On: 11/07/2014 07:12   Ct Cervical Spine Wo Contrast  11/15/2014   CLINICAL DATA:  Patient found unresponsive with bleeding from right side of head. Chronic renal failure  EXAM: CT HEAD  WITHOUT CONTRAST  CT CERVICAL SPINE WITHOUT CONTRAST  TECHNIQUE: Multidetector CT imaging of the head and cervical spine was performed following the standard protocol without intravenous contrast. Multiplanar CT image reconstructions of the cervical spine were also generated.  COMPARISON:  Head CT August 26, 2014  FINDINGS: CT HEAD FINDINGS  Mild diffuse atrophy is stable. There is no intracranial mass, hemorrhage, extra-axial fluid collection, or midline shift. There is patchy small vessel disease in the centra semiovale bilaterally. There is small vessel disease in the pons with several tiny lacunar type infarcts in this area. No acute infarct is evident on this study.  There is a a sizable midline frontal scalp hematoma. The bony calvarium appears intact. The mastoid air cells are clear. There is an air-fluid level in the right frontal sinus. There is opacification of several anterior ethmoid air cells. There is also mucosal thickening in each maxillary antrum.  CT CERVICAL SPINE FINDINGS  There is no fracture or spondylolisthesis. Prevertebral soft tissues and predental space regions are normal.  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 There is extensive bony overgrowth this area with severe spinal stenosis throughout the cervical spine, most rim attic at C3-4 and C4-5. There is moderate disc space narrowing at C6-7 and C7-T1. There is milder narrowing at other levels. There are prominent anterior osteophytes at C3, C4, and C5. There is no disc extrusion.  There is widespread vascular calcification.  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  IMPRESSION: CT head: Atrophy with supratentorial infratentorial small vessel disease. No acute infarct evident. No intracranial mass, hemorrhage, or acute appearing infarct. Areas of paranasal sinus disease with air-fluid level in the  right frontal sinus. No fractures evident. Frontal scalp hematoma.  CT cervical spine: No fracture or spondylolisthesis. 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 There is moderate spinal stenosis at multiple other levels due to the extensive bony overgrowth in this area.  There are bony changes of secondary hyperparathyroidism/chronic renal failure.  Multilevel osteoarthritic change.  Extensive calcification of multiple vascular structures.   Electronically Signed   By: Lowella Grip III M.D.   On: 11/10/2014 07:12   Dg Chest Port 1 View  11/10/2014   CLINICAL DATA:  Found unresponsive. History of end-stage renal disease, anemia, hypotension.  EXAM: PORTABLE CHEST 1 VIEW  COMPARISON:  Chest radiograph September 05, 2014  FINDINGS: Endotracheal tube distal tip projects 2.2 cm above the carina. The cardiac silhouette is moderately enlarged. Pulmonary vascular congestion interstitial prominence consistent with pulmonary edema without pleural effusion or focal consolidation. No  pneumothorax though lung apices incompletely imaged.  Dialysis catheter via inferior vena cava approach. Vascular stent in the RIGHT arm partially imaged. Vascular calcifications in LEFT arm.  IMPRESSION: Similar cardiomegaly.  Interstitial edema.  Endotracheal tube tip projects 3.2 cm above the carina.   Electronically Signed   By: Elon Alas M.D.   On: 11/20/2014 06:20     ASSESSMENT / PLAN:  PULMONARY ETT 10/01 >> A: Acute respiratory failure 2nd to pulmonary edema. P:   Full vent support for now F/u CXR  CARDIOVASCULAR A:  Non ischemic CM, CAD. Chronic hypotension. Elevated blood pressure 10/01. P:  Continue ASA PRN hydralazine for SBP > 170 Hold outpt midodrine F/u cardiac enzymes  RENAL A:   ESRD on HD MWF. P:   Nephrology consulted  GASTROINTESTINAL A:   Nutrition. P:   NPO Protonix for SUP Tube feeds if not able to be extubated  soon  HEMATOLOGIC A:   Anemia of chronic disease. P:  F/u CBC SQ heparin for DVT prevention  INFECTIOUS A:   No evidence for infection. P:   Monitor clinically  ENDOCRINE A:   No acute issues.   P:   Monitor blood sugar on BMET  NEUROLOGIC A:   Syncope >> likely from hypoxia and pulmonary edema. Lt frontal scalp laceration. P:   RASS goal: -1 Wound care Keep cervical collar in place for now  Updated family at bedside 10/01.  CC time 43 minutes.  Chesley Mires, MD [facility] Pulmonary/Critical Care 10/27/2014, 8:38 AM Pager:  [phone] After 3pm call: [phone]

## 2014-10-26 NOTE — ED Provider Notes (Signed)
SUBJECTIVE:  64 y.o. male sustained laceration of face 1 hours ago. Petra Kuba of injury: fall. Tetanus vaccination status reviewed: Td vaccination indicated and given today.   OBJECTIVE:  Patient presents altered after a fall, requiring intubation.    ASSESSMENT:  Multiple lacerations to the central forehead; 2, 6 and 4 cm respectively moving left to right.  Description: no foreign bodies, ragged edges. Visualization of the fascia overlying the cranial bones in the largest laceration noted during wound exploration  PLAN:   LACERATION REPAIR Performed by: Abigail Butts Authorized by: Abigail Butts Consent: Verbal consent obtained. Risks and benefits: risks, benefits and alternatives were discussed Consent given by: patient Patient identity confirmed: provided demographic data Prepped and Draped in normal sterile fashion Wound explored  Laceration Location: central forehead  Laceration Length: 6 cm  No Foreign Bodies seen or palpated  Anesthesia: local infiltration  Local anesthetic: lidocaine 1% with epinephrine  Anesthetic total: 7 ml  Irrigation method: syringe Amount of cleaning: standard  Skin closure: 5-0 prolene  Number of sutures: 7  Technique: SI  Patient tolerance: Patient tolerated the procedure well with no immediate complications.   LACERATION REPAIR Performed by: Abigail Butts Authorized by: Abigail Butts Consent: Verbal consent obtained. Risks and benefits: risks, benefits and alternatives were discussed Consent given by: patient Patient identity confirmed: provided demographic data Prepped and Draped in normal sterile fashion Wound explored  Laceration Location: left medial forehead  Laceration Length: 2 cm  No Foreign Bodies seen or palpated  Anesthesia: local infiltration  Local anesthetic: lidocaine 1% with epinephrine  Anesthetic total: 3 ml  Irrigation method: syringe Amount of cleaning: standard  Skin  closure: 5-0 prolene  Number of sutures: 2  Technique: simple interrupted  Patient tolerance: Patient tolerated the procedure well with no immediate complications.   LACERATION REPAIR Performed by: Abigail Butts Authorized by: Abigail Butts Consent: Verbal consent obtained. Risks and benefits: risks, benefits and alternatives were discussed Consent given by: patient Patient identity confirmed: provided demographic data Prepped and Draped in normal sterile fashion Wound explored  Laceration Location:  Right medial forehead  Laceration Length: 4 cm  No Foreign Bodies seen or palpated  Anesthesia: local infiltration  Local anesthetic: lidocaine 1% with epinephrine  Anesthetic total: 4 ml  Irrigation method: syringe Amount of cleaning: standard  Skin closure: 5-0 prolene  Number of sutures: 4  Technique: simple interrupted  Patient tolerance: Patient tolerated the procedure well with no immediate complications.   Pt admitted to ICU.    BP 150/98 mmHg  Pulse 107  Temp(Src) 97.2 F (36.2 C) (Temporal)  Resp 16  Ht 5\' 11"  (1.803 m)  SpO2 89%   Abigail Butts, PA-C 11/20/2014 6045  Julianne Rice, MD 11/07/14 2258

## 2014-10-26 NOTE — ED Notes (Signed)
Patient presents via EMS stating the staff stated he had just fallen out of the bed even though there was a large amount of gelled blood on the floor.  Patient with right swollen eye and blood coming from the right side of his head.  Stated he was laying on the floor on his right hand.  Patient unresponsive, breathing on his own.

## 2014-10-27 ENCOUNTER — Inpatient Hospital Stay (HOSPITAL_COMMUNITY): Payer: Medicare Other

## 2014-10-27 DIAGNOSIS — R6521 Severe sepsis with septic shock: Secondary | ICD-10-CM

## 2014-10-27 DIAGNOSIS — A419 Sepsis, unspecified organism: Secondary | ICD-10-CM

## 2014-10-27 LAB — BASIC METABOLIC PANEL
ANION GAP: 12 (ref 5–15)
BUN: 14 mg/dL (ref 6–20)
CHLORIDE: 98 mmol/L — AB (ref 101–111)
CO2: 27 mmol/L (ref 22–32)
Calcium: 8.8 mg/dL — ABNORMAL LOW (ref 8.9–10.3)
Creatinine, Ser: 3.46 mg/dL — ABNORMAL HIGH (ref 0.61–1.24)
GFR, EST AFRICAN AMERICAN: 20 mL/min — AB (ref >60)
GFR, EST NON AFRICAN AMERICAN: 18 mL/min — AB (ref >60)
Glucose, Bld: 96 mg/dL (ref 65–99)
POTASSIUM: 3.3 mmol/L — AB (ref 3.5–5.1)
SODIUM: 137 mmol/L (ref 135–145)

## 2014-10-27 LAB — GLUCOSE, CAPILLARY
GLUCOSE-CAPILLARY: 92 mg/dL (ref 65–99)
GLUCOSE-CAPILLARY: 94 mg/dL (ref 65–99)
Glucose-Capillary: 102 mg/dL — ABNORMAL HIGH (ref 65–99)
Glucose-Capillary: 91 mg/dL (ref 65–99)
Glucose-Capillary: 88 mg/dL (ref 65–99)
Glucose-Capillary: 113 mg/dL — ABNORMAL HIGH (ref 65–99)

## 2014-10-27 LAB — BLOOD GAS, ARTERIAL
ACID-BASE EXCESS: 1.9 mmol/L (ref 0.0–2.0)
Bicarbonate: 25.5 mEq/L — ABNORMAL HIGH (ref 20.0–24.0)
FIO2: 0.4
MECHVT: 600 mL
O2 Saturation: 99.3 %
PATIENT TEMPERATURE: 98.6
PEEP/CPAP: 5 cmH2O
PH ART: 7.455 — AB (ref 7.350–7.450)
PO2 ART: 152 mmHg — AB (ref 80.0–100.0)
RATE: 16 resp/min
TCO2: 26.6 mmol/L (ref 0–100)
pCO2 arterial: 36.7 mmHg (ref 35.0–45.0)

## 2014-10-27 LAB — RENAL FUNCTION PANEL
ALBUMIN: 2.8 g/dL — AB (ref 3.5–5.0)
ANION GAP: 10 (ref 5–15)
BUN: 16 mg/dL (ref 6–20)
CO2: 28 mmol/L (ref 22–32)
Calcium: 8.7 mg/dL — ABNORMAL LOW (ref 8.9–10.3)
Chloride: 98 mmol/L — ABNORMAL LOW (ref 101–111)
Creatinine, Ser: 3.84 mg/dL — ABNORMAL HIGH (ref 0.61–1.24)
GFR, EST AFRICAN AMERICAN: 18 mL/min — AB (ref >60)
GFR, EST NON AFRICAN AMERICAN: 15 mL/min — AB (ref >60)
Glucose, Bld: 100 mg/dL — ABNORMAL HIGH (ref 65–99)
PHOSPHORUS: 2.4 mg/dL — AB (ref 2.5–4.6)
POTASSIUM: 3.5 mmol/L (ref 3.5–5.1)
Sodium: 136 mmol/L (ref 135–145)

## 2014-10-27 LAB — CBC
HCT: 34.5 % — ABNORMAL LOW (ref 39.0–52.0)
HEMOGLOBIN: 11.3 g/dL — AB (ref 13.0–17.0)
MCH: 31 pg (ref 26.0–34.0)
MCHC: 32.8 g/dL (ref 30.0–36.0)
MCV: 94.5 fL (ref 78.0–100.0)
PLATELETS: 138 10*3/uL — AB (ref 150–400)
RBC: 3.65 MIL/uL — AB (ref 4.22–5.81)
RDW: 16.5 % — ABNORMAL HIGH (ref 11.5–15.5)
WBC: 9 10*3/uL (ref 4.0–10.5)

## 2014-10-27 LAB — PHOSPHORUS: Phosphorus: 2.7 mg/dL (ref 2.5–4.6)

## 2014-10-27 LAB — HEPATITIS B SURFACE ANTIGEN: Hepatitis B Surface Ag: NEGATIVE

## 2014-10-27 LAB — MAGNESIUM: MAGNESIUM: 2.1 mg/dL (ref 1.7–2.4)

## 2014-10-27 MED ORDER — VANCOMYCIN HCL IN DEXTROSE 1-5 GM/200ML-% IV SOLN
1000.0000 mg | INTRAVENOUS | Status: DC
  Administered 2014-10-28: 1000 mg via INTRAVENOUS
  Filled 2014-10-27 (×3): qty 200

## 2014-10-27 MED ORDER — VANCOMYCIN HCL 10 G IV SOLR
2000.0000 mg | Freq: Once | INTRAVENOUS | Status: AC
  Administered 2014-10-27: 2000 mg via INTRAVENOUS
  Filled 2014-10-27: qty 2000

## 2014-10-27 MED ORDER — PIPERACILLIN-TAZOBACTAM IN DEX 2-0.25 GM/50ML IV SOLN
2.2500 g | Freq: Three times a day (TID) | INTRAVENOUS | Status: DC
  Administered 2014-10-27 – 2014-10-30 (×10): 2.25 g via INTRAVENOUS
  Filled 2014-10-27 (×12): qty 50

## 2014-10-27 MED ORDER — VITAL AF 1.2 CAL PO LIQD
1000.0000 mL | ORAL | Status: DC
  Administered 2014-10-27: 1000 mL
  Filled 2014-10-27: qty 1000

## 2014-10-27 MED ORDER — PRO-STAT SUGAR FREE PO LIQD
30.0000 mL | Freq: Two times a day (BID) | ORAL | Status: DC
  Administered 2014-10-27 – 2014-10-28 (×3): 30 mL
  Filled 2014-10-27 (×4): qty 30

## 2014-10-27 MED ORDER — SODIUM CHLORIDE 0.9 % IV SOLN
4.0000 mg/h | INTRAVENOUS | Status: DC
  Administered 2014-10-27: 4 mg/h via INTRAVENOUS
  Filled 2014-10-27: qty 10

## 2014-10-27 MED ORDER — VITAL HIGH PROTEIN PO LIQD: 1000.0000 mL | ORAL | Status: DC

## 2014-10-27 NOTE — Progress Notes (Addendum)
ANTIBIOTIC CONSULT NOTE - INITIAL  Pharmacy Consult for vanc/zosyn Indication: rule out sepsis  No Known Allergies  Patient Measurements: Height: 5\' 11"  (180.3 cm) Weight: 194 lb 7.1 oz (88.2 kg) IBW/kg (Calculated) : 75.3  Vital Signs: Temp: 101 F (38.3 C) (10/02 0745) Temp Source: Oral (10/02 0745) BP: 96/51 mmHg (10/02 0805) Pulse Rate: 79 (10/02 0805) Intake/Output from previous day: 10/01 0701 - 10/02 0700 In: 1487.6 [I.V.:1397.6; NG/GT:90] Out: 3550 [Emesis/NG output:600] Intake/Output from this shift:    Labs:  Recent Labs  11/10/2014 0624 11/11/2014 0625 11/19/2014 2145 10/27/14 0015 10/27/14 0415  WBC  --  5.7  --   --  9.0  HGB 12.6* 11.2*  --   --  11.3*  PLT  --  143*  --   --  138*  CREATININE 4.30* 4.87* 3.36* 3.46* 3.84*   Estimated Creatinine Clearance: 21.2 mL/min (by C-G formula based on Cr of 3.84). No results for input(s): VANCOTROUGH, VANCOPEAK, VANCORANDOM, GENTTROUGH, GENTPEAK, GENTRANDOM, TOBRATROUGH, TOBRAPEAK, TOBRARND, AMIKACINPEAK, AMIKACINTROU, AMIKACIN in the last 72 hours.   Microbiology: Recent Results (from the past 720 hour(s))  MRSA PCR Screening     Status: None   Collection Time: 11/03/2014 10:01 AM  Result Value Ref Range Status   MRSA by PCR NEGATIVE NEGATIVE Final    Comment:        The GeneXpert MRSA Assay (FDA approved for NASAL specimens only), is one component of a comprehensive MRSA colonization surveillance program. It is not intended to diagnose MRSA infection nor to guide or monitor treatment for MRSA infections.     Medical History: Past Medical History  Diagnosis Date  . ESRD (end stage renal disease) ([place]) 1996    a. BMS [place], M-W-F  . Anemia   . GERD (gastroesophageal reflux disease)   . Nonischemic cardiomyopathy ([place])     a. dx 2005;  b. 04/2009 Echo: EF  >55%;  c. 12/2013 Echo: EF 20-25%, diff HK, inflat/infsept/inf AK. Mild AS, mod MR, sev dil LA, PASP 47 mmHg.  Marland Kitchen Perirectal abscess    a. s/p I & D 2013.  Marland Kitchen Secondary hyperparathyroidism ([place])   . Chronic obstructive pulmonary disease ([place])   . Retroperitoneal mass     a. 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  . Hypotension     On midodrine for support   . CAD (coronary artery disease)     a. L and R cath 01/2014: widely patent coronary arteries with minimal non-obstructive CAD     Assessment: 28 yom from NH with syncope, superficial head injury. Intubated on arrival. CXR - pulm edema. Pharmacy consulted to dose vanc/zosyn for sepsis in HD patient with recent groin permacath. Tmax/24h 102.5, wbc wnl  10/2 vanc>> 10/2 zosyn>>  10/1 BCx2>> MRSA PCR neg   Goal of Therapy:  Target pre-HD level: 15 - 25 mcg/ml Target post-HD level: 5 - 15 mcg/ml  Plan:  Vanc 2g IV load **Entered Vanc 1g IV qHD-MWF but ensure patient stays on schedule and adjust as necessary Zosyn 2.25g IV q8h Mon clinical progress, c/s, abx plan/LOT Vanc levels as indicated  Elicia Lamp, PharmD Clinical Pharmacist Pager [phone] 10/27/2014 9:00 AM

## 2014-10-27 NOTE — Progress Notes (Signed)
PULMONARY / CRITICAL CARE MEDICINE   Name: [name] MRN: [id number] DOB: 07-31-1950    ADMISSION DATE:  11/16/2014  REFERRING MD :  ER  CHIEF COMPLAINT:  Syncope  INITIAL PRESENTATION:  64 yo male from NH with syncope, superficial head injury.  Intubated for airway protection.  CXR with pulmonary edema.  He has hx of ESRD on HD.  STUDIES:  10/01 CT head >> mild diffuse atrophy, frontal scalp hematoma 10/01 CT neck >> C3-C4 spinal stenosis  SIGNIFICANT EVENTS: 10/01 Admit, renal consulted  SUBJECTIVE:  Fevers persist .  Remains on pressors   VITAL SIGNS: Temp:  [99.2 F (37.3 C)-102.5 F (39.2 C)] 101 F (38.3 C) (10/02 0745) Pulse Rate:  [26-93] 77 (10/02 0400) Resp:  [12-20] 16 (10/02 0400) BP: (50-220)/(13-199) 96/45 mmHg (10/02 0038) SpO2:  [98 %-100 %] 100 % (10/02 0400) Arterial Line BP: (83-123)/(45-70) 93/50 mmHg (10/02 0700) FiO2 (%):  [40 %] 40 % (10/02 0400) Weight:  [88.2 kg (194 lb 7.1 oz)-92.6 kg (204 lb 2.3 oz)] 88.2 kg (194 lb 7.1 oz) (10/02 0500) HEMODYNAMICS:   VENTILATOR SETTINGS: Vent Mode:  [-] PRVC FiO2 (%):  [40 %] 40 % Set Rate:  [16 bmp] 16 bmp Vt Set:  [600 mL] 600 mL PEEP:  [5 cmH20] 5 cmH20 Plateau Pressure:  [14 cmH20-23 cmH20] 23 cmH20 INTAKE / OUTPUT:  Intake/Output Summary (Last 24 hours) at 10/27/14 0757 Last data filed at 10/27/14 0600  Gross per 24 hour  Intake 1487.58 ml  Output   3550 ml  Net -2062.42 ml    PHYSICAL EXAMINATION: General: sedated on vent  Neuro: sedated  HEENT:  Sutures along  Lt frontal area, pupils reactive, ETT in place Cardiovascular:  Regular, 2/6 SM  Lungs:  B/l crackles Abdomen:  Soft, hypoactive BS  Musculoskeletal:  Lt femoral HD cath site clean, 1+ edema, old AV grafts in arms Skin:  Chronic venous stasis changes, head wound intact /suture in place , no redness   LABS:  CBC  Recent Labs Lab 10/29/2014 0624 11/22/2014 0625 10/27/14 0415  WBC  --  5.7 9.0  HGB 12.6* 11.2* 11.3*   HCT 37.0* 35.9* 34.5*  PLT  --  143* 138*   Coag's  Recent Labs Lab 11/10/2014 0625  APTT 28  INR 1.17   BMET  Recent Labs Lab 11/07/2014 2145 10/27/14 0015 10/27/14 0415  NA 136 137 136  K 3.0* 3.3* 3.5  CL 98* 98* 98*  CO2 28 27 28   BUN 14 14 16   CREATININE 3.36* 3.46* 3.84*  GLUCOSE 95 96 100*   Electrolytes  Recent Labs Lab 11/14/2014 2145 10/27/14 0015 10/27/14 0415  CALCIUM 8.6* 8.8* 8.7*  MG  --  2.1  --   PHOS  --   --  2.4*   Sepsis Markers  Recent Labs Lab 10/27/2014 0623  LATICACIDVEN 1.93   ABG  Recent Labs Lab 11/02/2014 0658  PHART 7.336*  PCO2ART 60.1*  PO2ART 419.0*   Liver Enzymes  Recent Labs Lab 11/07/2014 0625 11/04/2014 2145 10/27/14 0415  AST 25 20  --   ALT 17 18  --   ALKPHOS 98 94  --   BILITOT 1.2 1.7*  --   ALBUMIN 3.1* 3.0* 2.8*   Cardiac Enzymes  Recent Labs Lab 11/16/2014 0905 11/22/2014 1530 10/27/2014 2022  TROPONINI 0.04* 0.06* 0.07*     Glucose  Recent Labs Lab 10/29/2014 1137 11/10/2014 1215 11/23/2014 1601 11/14/2014 2015 10/27/14 0022 10/27/14 0413  GLUCAP  64* 73 86 97 92 102*    Imaging Dg Chest Port 1 View  10/27/2014   CLINICAL DATA:  Hypoxia/shortness of breath  EXAM: PORTABLE CHEST 1 VIEW  COMPARISON:  October 26, 2014  FINDINGS: Endotracheal tube tip is 3.7 cm above the carina. Nasogastric tube tip and side port are below the diaphragm. No pneumothorax. There is interstitial and patchy alveolar edema with cardiomegaly and pulmonary venous hypertension. There is mild atelectasis in the left base. No adenopathy.  IMPRESSION: Tube positions as described without pneumothorax. Evidence of congestive heart failure. No appreciable change from 1 day prior.   Electronically Signed   By: Lowella Grip III M.D.   On: 10/27/2014 07:13     ASSESSMENT / PLAN:  PULMONARY ETT 10/01 >> A: Acute respiratory failure 2nd to pulmonary edema. P:   Full vent support for now F/u CXR in am   CARDIOVASCULAR A:  Non  ischemic CM, CAD. HTN on admission  Chronic hypotension on Midodrine  Hypotension/Septic shock -pressors started 10/1  Mildly elevated troponin suspect demand  P:  Continue ASA PRN hydralazine for SBP > 170 Check EKG  Wean pressors for MAP >65  Cont home midodrine   RENAL A:   ESRD on HD MWF. P:   Nephrology following , plans for HD in am   GASTROINTESTINAL A:   Nutrition. P:   Protonix for SUP Begin Tube feeds   HEMATOLOGIC A:   Anemia of chronic disease. P:  F/u CBC SQ heparin for DVT prevention  INFECTIOUS A:   Fevers /Sepsis  ? Source (recent groin Perma cath ) P:   Begin IV abx with Vanc/Zosyn 10/2   10/1 BC x 2>>  ENDOCRINE A:   Hypoglycemia    P:   Monitor blood sugar on BMET Cont D10 at 20cc/hr   NEUROLOGIC A:   Syncope >> likely from hypoxia and pulmonary edema. Lt frontal scalp laceration. CT head w/ chronic changes and scalp hematoma CT spine , no fx, severe spinal stenosis at C3-4  P:   RASS goal: -1 Wound care Sedation protocol   Tammy Parrett NP-C  [facility]  [phone]   Reviewed above.  Febrile and hypotensive overnight >> Abx and pressors started.  Remains on vent.  Sleepy.  Scattered rhonchi.  Abd soft, HR regular.  Labs, CXR reviewed from 10/02.  Will wean pressors to keep MAP > 65.  Continue ABx pending cx results.  HD per renal.  CC time 32 minutes by me independent of APP time.  Chesley Mires, MD [facility] Pulmonary/Critical Care 10/27/2014, 12:19 PM Pager:  [phone] After 3pm call: [phone]

## 2014-10-27 NOTE — Progress Notes (Signed)
Brief Nutrition Note  Consult received for enteral/tube feeding initiation and management.  Adult Enteral Nutrition Protocol initiated. Full assessment completed 10/1 with recommendation for Vital 1.2 @ 25 mL/hr Day 1, goal of 1320 mL/d starting Day 2.  Orders placed.   Admitting Dx: Respiratory failure ([place]) [J96.90] Acute pulmonary edema ([place]) [J81.0] Acute respiratory failure with hypoxia ([place]) [J96.01] Closed head injury, initial encounter [S09.90XA] Scalp laceration, initial encounter [S01.01XA]  Body mass index is 27.13 kg/(m^2). Pt meets criteria for overweight based on current BMI.  Labs:   Recent Labs Lab 11/12/2014 2145 10/27/14 0015 10/27/14 0415  NA 136 137 136  K 3.0* 3.3* 3.5  CL 98* 98* 98*  CO2 28 27 28   BUN 14 14 16   CREATININE 3.36* 3.46* 3.84*  CALCIUM 8.6* 8.8* 8.7*  MG  --  2.1  --   PHOS  --   --  2.4*  GLUCOSE 95 96 100*    Brynda Greathouse, MS RD LDN Clinical Inpatient Dietitian Weekend/After hours pager: [phone]

## 2014-10-27 NOTE — Progress Notes (Signed)
[place] Progress Note Patient Name: [name] DOB: 09-Jun-1950 MRN: [id number]   Date of Service  10/27/2014  HPI/Events of Note  Pt has persistent left leg twitching.   eICU Interventions  Increase versed to 4/hr. Ordered EEG.     Intervention Category Intermediate Interventions: Other:  Tremell Reimers 10/27/2014, 2:06 AM

## 2014-10-27 NOTE — Progress Notes (Signed)
Utilization Review Completed.[name] T10/02/2014  

## 2014-10-27 NOTE — Progress Notes (Signed)
S:sedated O:BP 96/45 mmHg  Pulse 77  Temp(Src) 100 F (37.8 C) (Oral)  Resp 16  Ht 5\' 11"  (1.803 m)  Wt 88.2 kg (194 lb 7.1 oz)  BMI 27.13 kg/m2  SpO2 100%  Intake/Output Summary (Last 24 hours) at 10/27/14 0734 Last data filed at 10/27/14 0600  Gross per 24 hour  Intake 1487.58 ml  Output   3550 ml  Net -2062.42 ml   Weight change:  ZOX:WRUEAVW and intubated CVS:RRR Resp:Scattered rhonchi Abd:+ BS NTND Ext: No Edema NEURO:Sedated Lt femoral PC   . antiseptic oral rinse  7 mL Mouth Rinse QID  . aspirin EC  81 mg Oral Daily  . chlorhexidine gluconate  15 mL Mouth Rinse BID  . cinacalcet  30 mg Oral Q breakfast  . [START ON 10/28/2014] doxercalciferol  2 mcg Intravenous Q M,W,F-HD  . [START ON 10/28/2014] ferric gluconate (FERRLECIT/NULECIT) IV  125 mg Intravenous Q M,W,F-HD  . heparin subcutaneous  5,000 Units Subcutaneous 3 times per day  . midodrine  10 mg Oral TID  . pantoprazole sodium  40 mg Per Tube Q24H   Ct Head Wo Contrast  11/20/2014   CLINICAL DATA:  Patient found unresponsive with bleeding from right side of head. Chronic renal failure  EXAM: CT HEAD WITHOUT CONTRAST  CT CERVICAL SPINE WITHOUT CONTRAST  TECHNIQUE: Multidetector CT imaging of the head and cervical spine was performed following the standard protocol without intravenous contrast. Multiplanar CT image reconstructions of the cervical spine were also generated.  COMPARISON:  Head CT August 26, 2014  FINDINGS: CT HEAD FINDINGS  Mild diffuse atrophy is stable. There is no intracranial mass, hemorrhage, extra-axial fluid collection, or midline shift. There is patchy small vessel disease in the centra semiovale bilaterally. There is small vessel disease in the pons with several tiny lacunar type infarcts in this area. No acute infarct is evident on this study.  There is a a sizable midline frontal scalp hematoma. The bony calvarium appears intact. The mastoid air cells are clear. There is an air-fluid level in  the right frontal sinus. There is opacification of several anterior ethmoid air cells. There is also mucosal thickening in each maxillary antrum.  CT CERVICAL SPINE FINDINGS  There is no fracture or spondylolisthesis. Prevertebral soft tissues and predental space regions are normal.  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 There is extensive bony overgrowth this area with severe spinal stenosis throughout the cervical spine, most rim attic at C3-4 and C4-5. There is moderate disc space narrowing at C6-7 and C7-T1. There is milder narrowing at other levels. There are prominent anterior osteophytes at C3, C4, and C5. There is no disc extrusion.  There is widespread vascular calcification.  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  IMPRESSION: CT head: Atrophy with supratentorial infratentorial small vessel disease. No acute infarct evident. No intracranial mass, hemorrhage, or acute appearing infarct. Areas of paranasal sinus disease with air-fluid level in the right frontal sinus. No fractures evident. Frontal scalp hematoma.  CT cervical spine: No fracture or spondylolisthesis. 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 There is moderate spinal stenosis at multiple other levels due to the extensive bony overgrowth in this area.  There are bony changes of secondary hyperparathyroidism/chronic renal failure.  Multilevel osteoarthritic change.  Extensive calcification of multiple vascular structures.   Electronically Signed   By: Gwyndolyn Saxon  Jasmine December III M.D.   On: 11/02/2014 07:12   Ct Cervical Spine Wo Contrast  11/22/2014   CLINICAL DATA:  Patient found unresponsive with bleeding from right side of head. Chronic renal failure  EXAM: CT HEAD WITHOUT CONTRAST  CT CERVICAL SPINE WITHOUT CONTRAST  TECHNIQUE: Multidetector CT  imaging of the head and cervical spine was performed following the standard protocol without intravenous contrast. Multiplanar CT image reconstructions of the cervical spine were also generated.  COMPARISON:  Head CT August 26, 2014  FINDINGS: CT HEAD FINDINGS  Mild diffuse atrophy is stable. There is no intracranial mass, hemorrhage, extra-axial fluid collection, or midline shift. There is patchy small vessel disease in the centra semiovale bilaterally. There is small vessel disease in the pons with several tiny lacunar type infarcts in this area. No acute infarct is evident on this study.  There is a a sizable midline frontal scalp hematoma. The bony calvarium appears intact. The mastoid air cells are clear. There is an air-fluid level in the right frontal sinus. There is opacification of several anterior ethmoid air cells. There is also mucosal thickening in each maxillary antrum.  CT CERVICAL SPINE FINDINGS  There is no fracture or spondylolisthesis. Prevertebral soft tissues and predental space regions are normal.  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 There is extensive bony overgrowth this area with severe spinal stenosis throughout the cervical spine, most rim attic at C3-4 and C4-5. There is moderate disc space narrowing at C6-7 and C7-T1. There is milder narrowing at other levels. There are prominent anterior osteophytes at C3, C4, and C5. There is no disc extrusion.  There is widespread vascular calcification.  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  IMPRESSION: CT head: Atrophy with supratentorial infratentorial small vessel disease. No acute infarct evident. No intracranial mass, hemorrhage, or acute appearing infarct. Areas of paranasal sinus disease with air-fluid level in the right frontal sinus. No fractures evident. Frontal scalp hematoma.  CT cervical  spine: No fracture or spondylolisthesis. 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 There is moderate spinal stenosis at multiple other levels due to the extensive bony overgrowth in this area.  There are bony changes of secondary hyperparathyroidism/chronic renal failure.  Multilevel osteoarthritic change.  Extensive calcification of multiple vascular structures.   Electronically Signed   By: Lowella Grip III M.D.   On: 11/24/2014 07:12   Dg Chest Port 1 View  10/27/2014   CLINICAL DATA:  Hypoxia/shortness of breath  EXAM: PORTABLE CHEST 1 VIEW  COMPARISON:  October 26, 2014  FINDINGS: Endotracheal tube tip is 3.7 cm above the carina. Nasogastric tube tip and side port are below the diaphragm. No pneumothorax. There is interstitial and patchy alveolar edema with cardiomegaly and pulmonary venous hypertension. There is mild atelectasis in the left base. No adenopathy.  IMPRESSION: Tube positions as described without pneumothorax. Evidence of congestive heart failure. No appreciable change from 1 day prior.   Electronically Signed   By: Lowella Grip III M.D.   On: 10/27/2014 07:13   Dg Chest Port 1 View  11/06/2014   CLINICAL DATA:  Found unresponsive. History of end-stage renal disease, anemia, hypotension.  EXAM: PORTABLE CHEST 1 VIEW  COMPARISON:  Chest radiograph September 05, 2014  FINDINGS: Endotracheal tube distal tip projects 2.2 cm above the carina. The cardiac silhouette is moderately enlarged. Pulmonary vascular congestion interstitial  prominence consistent with pulmonary edema without pleural effusion or focal consolidation. No pneumothorax though lung apices incompletely imaged.  Dialysis catheter via inferior vena cava approach. Vascular stent in the RIGHT arm partially imaged. Vascular calcifications in LEFT arm.  IMPRESSION: Similar cardiomegaly.  Interstitial edema.  Endotracheal tube tip projects 3.2 cm above the carina.    Electronically Signed   By: Elon Alas M.D.   On: 11/16/2014 06:20   BMET    Component Value Date/Time   NA 136 10/27/2014 0415   K 3.5 10/27/2014 0415   K 3.9 10/19/2013 1408   CL 98* 10/27/2014 0415   CO2 28 10/27/2014 0415   GLUCOSE 100* 10/27/2014 0415   BUN 16 10/27/2014 0415   CREATININE 3.84* 10/27/2014 0415   CALCIUM 8.7* 10/27/2014 0415   GFRNONAA 15* 10/27/2014 0415   GFRAA 18* 10/27/2014 0415   CBC    Component Value Date/Time   WBC 9.0 10/27/2014 0415   RBC 3.65* 10/27/2014 0415   HGB 11.3* 10/27/2014 0415   HCT 34.5* 10/27/2014 0415   PLT 138* 10/27/2014 0415   MCV 94.5 10/27/2014 0415   MCH 31.0 10/27/2014 0415   MCHC 32.8 10/27/2014 0415   RDW 16.5* 10/27/2014 0415   LYMPHSABS 0.6* 11/23/2014 0625   MONOABS 0.6 10/28/2014 0625   EOSABS 0.3 11/20/2014 0625   BASOSABS 0.0 11/13/2014 0625     Assessment: 1. ESRD  MWF 2. Pulm edema 3. Chronic hypotension 4. Sec [place]  SP PTHectomy 2002 5. Cardiomyopathy EF 15-20% 6. ? Septic from permcath   Cult P  Plan: 1. Spoke with CCM PA about starting empiric AB if plan is aggressive therapy 2.  Plan HD in AM 3. Recheck labs in AM    Kimbra Marcelino T

## 2014-10-28 ENCOUNTER — Inpatient Hospital Stay (HOSPITAL_COMMUNITY): Payer: Medicare Other

## 2014-10-28 ENCOUNTER — Other Ambulatory Visit (HOSPITAL_COMMUNITY): Payer: Medicare Other

## 2014-10-28 DIAGNOSIS — J9600 Acute respiratory failure, unspecified whether with hypoxia or hypercapnia: Secondary | ICD-10-CM | POA: Insufficient documentation

## 2014-10-28 LAB — GLUCOSE, CAPILLARY
GLUCOSE-CAPILLARY: 103 mg/dL — AB (ref 65–99)
GLUCOSE-CAPILLARY: 94 mg/dL (ref 65–99)
Glucose-Capillary: 116 mg/dL — ABNORMAL HIGH (ref 65–99)
Glucose-Capillary: 114 mg/dL — ABNORMAL HIGH (ref 65–99)
Glucose-Capillary: 95 mg/dL (ref 65–99)

## 2014-10-28 LAB — CBC
HCT: 36.7 % — ABNORMAL LOW (ref 39.0–52.0)
Hemoglobin: 11.7 g/dL — ABNORMAL LOW (ref 13.0–17.0)
MCH: 29.9 pg (ref 26.0–34.0)
MCHC: 31.9 g/dL (ref 30.0–36.0)
MCV: 93.9 fL (ref 78.0–100.0)
PLATELETS: 126 10*3/uL — AB (ref 150–400)
RBC: 3.91 MIL/uL — AB (ref 4.22–5.81)
RDW: 16.5 % — AB (ref 11.5–15.5)
WBC: 14.5 10*3/uL — AB (ref 4.0–10.5)

## 2014-10-28 LAB — PHOSPHORUS: PHOSPHORUS: 3.3 mg/dL (ref 2.5–4.6)

## 2014-10-28 LAB — RENAL FUNCTION PANEL
ALBUMIN: 2.5 g/dL — AB (ref 3.5–5.0)
ANION GAP: 12 (ref 5–15)
BUN: 33 mg/dL — ABNORMAL HIGH (ref 6–20)
CALCIUM: 8.8 mg/dL — AB (ref 8.9–10.3)
CO2: 24 mmol/L (ref 22–32)
CREATININE: 5.37 mg/dL — AB (ref 0.61–1.24)
Chloride: 97 mmol/L — ABNORMAL LOW (ref 101–111)
GFR calc non Af Amer: 10 mL/min — ABNORMAL LOW (ref >60)
GFR, EST AFRICAN AMERICAN: 12 mL/min — AB (ref >60)
GLUCOSE: 146 mg/dL — AB (ref 65–99)
PHOSPHORUS: 3.8 mg/dL (ref 2.5–4.6)
Potassium: 3.6 mmol/L (ref 3.5–5.1)
SODIUM: 133 mmol/L — AB (ref 135–145)

## 2014-10-28 LAB — LACTIC ACID, PLASMA: Lactic Acid, Venous: 1.6 mmol/L (ref 0.5–2.0)

## 2014-10-28 MED ORDER — NEPRO/CARBSTEADY PO LIQD
1000.0000 mL | ORAL | Status: DC
  Administered 2014-10-28 – 2014-10-29 (×2): 1000 mL via ORAL
  Filled 2014-10-28 (×5): qty 1000

## 2014-10-28 MED ORDER — SODIUM CHLORIDE 0.9 % IV SOLN
25.0000 ug/h | INTRAVENOUS | Status: DC
  Administered 2014-10-29: 25 ug/h via INTRAVENOUS
  Filled 2014-10-28 (×2): qty 50

## 2014-10-28 MED ORDER — PRO-STAT SUGAR FREE PO LIQD
30.0000 mL | Freq: Four times a day (QID) | ORAL | Status: DC
  Administered 2014-10-28 – 2014-10-30 (×6): 30 mL
  Filled 2014-10-28 (×11): qty 30

## 2014-10-28 MED ORDER — ACETAMINOPHEN 160 MG/5ML PO SOLN: 650.0000 mg | Freq: Four times a day (QID) | ORAL | Status: DC | PRN

## 2014-10-28 MED ORDER — FENTANYL BOLUS VIA INFUSION
50.0000 ug | INTRAVENOUS | Status: DC | PRN
  Administered 2014-10-29: 50 ug via INTRAVENOUS
  Filled 2014-10-28: qty 50

## 2014-10-28 MED ORDER — MIDAZOLAM HCL 2 MG/2ML IJ SOLN: 2.0000 mg | INTRAMUSCULAR | Status: DC | PRN

## 2014-10-28 NOTE — Progress Notes (Signed)
eLink Physician-Brief Progress Note Patient Name: JEANCLAUDE WENTWORTH DOB: 1950/06/14 MRN: [id number]   Date of Service  10/28/2014  HPI/Events of Note  Patient wife at bedside - says she cannot or not in position to talk to MD in fduring AM. She says she is not in favor of aggressive RX. She feels he is very weak  She can come physically to ICU  either at 7.30am or 7pm  She can take phone call anytime during day  - [phone] - [phone]    eICU Interventions  Bedside MD to call wife at the aove nubmers     Intervention Category Intermediate Interventions: Communication with other healthcare providers and/or family  Lavern Maslow 10/28/2014, 9:21 PM      Code Status Orders        Start     Ordered   11/12/2014 1154  Do not attempt resuscitation (DNR)   Continuous    Question Answer Comment  In the event of cardiac or respiratory ARREST Do not call a "code blue"   In the event of cardiac or respiratory ARREST Do not perform Intubation, CPR, defibrillation or ACLS   In the event of cardiac or respiratory ARREST Use medication by any route, position, wound care, and other measures to relive pain and suffering. May use oxygen, suction and manual treatment of airway obstruction as needed for comfort.      11/10/2014 1154

## 2014-10-28 NOTE — Progress Notes (Signed)
[name] Progress Note   Subjective: BP's unstable on HD today in 70's - 80's mostly , difficulty measuring  Filed Vitals:   10/28/14 0830 10/28/14 0834 10/28/14 0835 10/28/14 0845  BP: 63/45  69/54 81/59  Pulse:      Temp:  97.4 F (36.3 C)    TempSrc:  Oral    Resp: 16  16 13   Height:      Weight:      SpO2:       Exam: On vent, sedated, unresponsive No jvd Chest clear ant and lat RRR distant HS Abd obese no ascites, +bs, no mass RUE 3+ chronic nonpitting swelling Lt femoral PC / R forearm AVF +bruit Neuro sedated  MWF [place]  4.5h   90.5kg   2/2.25 bath  R fem cath/ maturing RFA AVF  Heparin 1000/2000 mid Rx Hect 4 Mircera 50 every 5 wks, last 9/14      Assessment: 1 Acute resp failure/ pulm edema / VDRF, per primary 2 Fever - blood cx's neg, on Vanc IV 3 Chronic hypotension - on midodrine at home, high dose neo gtt here 4 ESRD HD today, below dry wt by 2 kg 5 Anemia cont esa if needed 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 7 CM EF 15%, mod-severe AS 8 FTT/ goals of care - pt is DNR, wife confirms he would not want to be kept alive on life-support  Plan - per primary, HD today at bedside    Kelly Splinter MD  pager [phone]    cell [phone]  10/28/2014, 9:01 AM     Recent Labs Lab 10/27/14 0015  10/27/14 0415 10/27/14 1133 10/27/14 2315 10/28/14 0530  NA 137  --  136  --   --  133*  K 3.3*  --  3.5  --   --  3.6  CL 98*  --  98*  --   --  97*  CO2 27  --  28  --   --  24  GLUCOSE 96  --  100*  --   --  146*  BUN 14  --  16  --   --  33*  CREATININE 3.46*  --  3.84*  --   --  5.37*  CALCIUM 8.8*  --  8.7*  --   --  8.8*  PHOS  --   < > 2.4* 2.7 3.3 3.8  < > = values in this interval not displayed.  Recent Labs Lab 11/18/2014 0625 11/10/2014 2145 10/27/14 0415 10/28/14 0530  AST 25 20  --   --   ALT 17 18  --   --   ALKPHOS 98 94  --   --   BILITOT 1.2 1.7*  --   --   PROT 7.1 7.1  --   --   ALBUMIN 3.1* 3.0* 2.8* 2.5*    Recent Labs Lab  11/06/2014 0625 10/27/14 0415 10/28/14 0530  WBC 5.7 9.0 14.5*  NEUTROABS 4.2  --   --   HGB 11.2* 11.3* 11.7*  HCT 35.9* 34.5* 36.7*  MCV 98.9 94.5 93.9  PLT 143* 138* 126*   . antiseptic oral rinse  7 mL Mouth Rinse QID  . aspirin EC  81 mg Oral Daily  . chlorhexidine gluconate  15 mL Mouth Rinse BID  . cinacalcet  30 mg Oral Q breakfast  . doxercalciferol  2 mcg Intravenous Q M,W,F-HD  . feeding supplement (PRO-STAT SUGAR FREE 64)  30 mL Per Tube BID  .  ferric gluconate (FERRLECIT/NULECIT) IV  125 mg Intravenous Q M,W,F-HD  . heparin subcutaneous  5,000 Units Subcutaneous 3 times per day  . midodrine  10 mg Oral TID  . pantoprazole sodium  40 mg Per Tube Q24H  . piperacillin-tazobactam (ZOSYN)  IV  2.25 g Intravenous 3 times per day  . vancomycin  1,000 mg Intravenous Q M,W,F-HD   . dextrose 20 mL/hr at 10/28/14 0600  . feeding supplement (VITAL AF 1.2 CAL) 1,000 mL (10/28/14 0600)  . fentaNYL infusion INTRAVENOUS 50 mcg/hr (10/28/14 0800)  . midazolam (VERSED) infusion 2 mg/hr (10/28/14 0600)  . phenylephrine (NEO-SYNEPHRINE) Adult infusion 200 mcg/min (10/28/14 0811)   sodium chloride, sodium chloride, Place/Maintain arterial line **AND** sodium chloride, acetaminophen (TYLENOL) oral liquid 160 mg/5 mL, fentaNYL, hydrALAZINE, lidocaine (PF), lidocaine-prilocaine, pentafluoroprop-tetrafluoroeth

## 2014-10-28 NOTE — Progress Notes (Signed)
PULMONARY / CRITICAL CARE MEDICINE   Name: [name] MRN: [id number] DOB: 09-09-1950    ADMISSION DATE:  10/30/2014  REFERRING MD :  ER  CHIEF COMPLAINT:  Syncope  INITIAL PRESENTATION:  64 yo male from NH with syncope, superficial head injury.  Intubated for airway protection.  CXR with pulmonary edema.  He has hx of ESRD on HD.  STUDIES:  10/01 CT head >> mild diffuse atrophy, frontal scalp hematoma 10/01 CT neck >> C3-C4 spinal stenosis  SIGNIFICANT EVENTS: 10/01 Admit, renal consulted 10/3 - A line fell out  SUBJECTIVE:  Fevers persist (Tmax 102 at 0012) Remains on pressors and intubated Sedation weaned this AM, but not yet following comands  VITAL SIGNS: Temp:  [97.4 F (36.3 C)-102 F (38.9 C)] 97.4 F (36.3 C) (10/03 0834) Pulse Rate:  [38-85] 71 (10/03 0815) Resp:  [0-19] 16 (10/03 0835) BP: (63-146)/(28-102) 69/54 mmHg (10/03 0835) SpO2:  [99 %-100 %] 100 % (10/03 0802) Arterial Line BP: (76-160)/(43-155) 76/47 mmHg (10/03 0245) FiO2 (%):  [40 %] 40 % (10/03 0800) Weight:  [194 lb 7.1 oz (88.2 kg)] 194 lb 7.1 oz (88.2 kg) (10/03 0730) HEMODYNAMICS:   VENTILATOR SETTINGS: Vent Mode:  [-] PRVC FiO2 (%):  [40 %] 40 % Set Rate:  [16 bmp] 16 bmp Vt Set:  [600 mL] 600 mL PEEP:  [5 cmH20] 5 cmH20 Plateau Pressure:  [20 cmH20-29 cmH20] 22 cmH20 INTAKE / OUTPUT:  Intake/Output Summary (Last 24 hours) at 10/28/14 0839 Last data filed at 10/28/14 0600  Gross per 24 hour  Intake 2998.97 ml  Output    200 ml  Net 2798.97 ml    PHYSICAL EXAMINATION: General: On vent resting comfortably Neuro: Opens eyes to name, will not follow comands  HEENT:  Sutures along  Lt frontal area, pupils reactive, ETT in place Cardiovascular:  Regular, 2/6 SM  Lungs:  B/l diffuse crackles Abdomen:  Soft, normoactive BS  Musculoskeletal:  Lt femoral HD cath site clean, 1+ edema, old AV grafts in arms Skin:  Chronic venous stasis changes, head wound intact /suture in place  , no redness   LABS:  CBC  Recent Labs Lab 11/17/2014 0625 10/27/14 0415 10/28/14 0530  WBC 5.7 9.0 14.5*  HGB 11.2* 11.3* 11.7*  HCT 35.9* 34.5* 36.7*  PLT 143* 138* 126*   Coag's  Recent Labs Lab 11/16/2014 0625  APTT 28  INR 1.17   BMET  Recent Labs Lab 10/27/14 0015 10/27/14 0415 10/28/14 0530  NA 137 136 133*  K 3.3* 3.5 3.6  CL 98* 98* 97*  CO2 27 28 24   BUN 14 16 33*  CREATININE 3.46* 3.84* 5.37*  GLUCOSE 96 100* 146*   Electrolytes  Recent Labs Lab 10/27/14 0015  10/27/14 0415 10/27/14 1133 10/27/14 2315 10/28/14 0530  CALCIUM 8.8*  --  8.7*  --   --  8.8*  MG 2.1  --   --   --   --   --   PHOS  --   < > 2.4* 2.7 3.3 3.8  < > = values in this interval not displayed. Sepsis Markers  Recent Labs Lab 11/21/2014 0623 10/28/14 0530  LATICACIDVEN 1.93 1.6   ABG  Recent Labs Lab 11/07/2014 0658 10/27/14 2320  PHART 7.336* 7.455*  PCO2ART 60.1* 36.7  PO2ART 419.0* 152*   Liver Enzymes  Recent Labs Lab 11/19/2014 0625 10/30/2014 2145 10/27/14 0415 10/28/14 0530  AST 25 20  --   --   ALT 17  18  --   --   ALKPHOS 98 94  --   --   BILITOT 1.2 1.7*  --   --   ALBUMIN 3.1* 3.0* 2.8* 2.5*   Cardiac Enzymes  Recent Labs Lab 11/02/2014 0905 11/08/2014 1530 10/27/2014 2022  TROPONINI 0.04* 0.06* 0.07*     Glucose  Recent Labs Lab 10/27/14 1143 10/27/14 1608 10/27/14 2013 10/28/14 0006 10/28/14 0425 10/28/14 0730  GLUCAP 91 88 113* 103* 116* 114*    Imaging Dg Chest Port 1 View  10/28/2014   CLINICAL DATA:  Shortness breath.  EXAM: PORTABLE CHEST 1 VIEW  COMPARISON:  10/27/2014.  FINDINGS: Endotracheal tube and NG tube in stable position. Cardiomegaly with diffuse bilateral pulmonary alveolar infiltrates are again noted consistent with congestive heart failure. Right-sided pleural effusion again noted. No pneumothorax.  IMPRESSION: 1. Lines and tubes in stable position. 2. Persistent cardiomegaly with bilateral pulmonary alveolar  infiltrates and right pleural effusion consistent persistent congestive heart failure. No significant interim improvement.   Electronically Signed   By: Marcello Moores  Register   On: 10/28/2014 07:16     ASSESSMENT / PLAN:  PULMONARY ETT 10/01 >> A: Acute respiratory failure 2/2 pulmonary edema - stable, no improvement in CXR P:   Full vent support currently F/u CXR in am   CARDIOVASCULAR A:  Non ischemic CM (EF 15-20%), CAD. HTN on admission  Chronic hypotension on Midodrine  Hypotension/Septic shock -pressors started 10/1  Mildly elevated troponin suspect demand  P:  Continue ASA PRN hydralazine for SBP > 170 Continue pressors for MAP >65  Cont home midodrine   RENAL A:   ESRD on HD MWF. Hyponatremia P:   Nephrology following, HD currently  Daily BMET  GASTROINTESTINAL A:   Nutrition. P:   Protonix for SUP Continue Tube feeds   HEMATOLOGIC A:   Anemia of chronic disease - stable. P:  F/u Daily CBC SQ heparin for DVT prevention  INFECTIOUS A:   Fevers /Sepsis  - Unclear Source (recent groin Perma cath) P:   Continue Vanc/Zosyn (10/2>>)  10/1 BC x 2 >>  NGTD  ENDOCRINE A:   Hypoglycemia - resolved    P:   Monitor blood sugar on BMET Wean D10  NEUROLOGIC A:   Syncope >> likely from hypoxia and pulmonary edema. Lt frontal scalp laceration. CT head w/ chronic changes and scalp hematoma CT spine , no fx, severe spinal stenosis at C3-4 P:   RASS goal: -1 Wound care Sedation weaned  F/u mental status   Virginia Crews, MD, MPH PGY-2,  [facility] 10/28/2014 8:42 AM Pager [phone]

## 2014-10-28 NOTE — Progress Notes (Signed)
Pt is unavail for EEG. Pt having HD. Told nurse we will do EEG done tomorrow morning

## 2014-10-28 NOTE — Clinical Documentation Improvement (Signed)
Critical Care  Can the diagnosis of CHF be further specified?    Acuity - Acute, Chronic, Acute on Chronic   Type - Systolic, Diastolic, Systolic and Diastolic  Other  Clinically Undetermined  Document any associated diagnoses/conditions  Supporting Information:"Patient has a history of end-stage renal disease as well as CHF. " "CXR with pulmonary edema. He has hx of ESRD on HD. "  Labs: Component B Natriuretic Peptide  Latest Ref Rng 0.0 - 100.0 pg/mL  11/16/2014 2257.9 (H)    Please exercise your independent, professional judgment when responding. A specific answer is not anticipated or expected.   Thank You, Alessandra Grout, RN, BSN, CCDS,Clinical Documentation Specialist:  [phone]  [phone]=Cell [place]- Health Information Management

## 2014-10-28 NOTE — Progress Notes (Signed)
Dr. Halford Chessman notified of patients systolic blood pressure consistently running in the 70s. Patient has many vascular issues.  No new orders given. Will continue phenylephrine at 200 mcg and continue to monitor.

## 2014-10-28 NOTE — Progress Notes (Signed)
Nutrition Follow-up  DOCUMENTATION CODES:   Not applicable  INTERVENTION:    Continue TF via OGT, change formula to Nepro at goal rate of 40 ml/h (960 ml per day) and Prostat 30 ml QID to provide 2128 kcals, 138 gm protein, 698 ml free water daily.  NUTRITION DIAGNOSIS:   Inadequate oral intake related to inability to eat as evidenced by NPO status.  Ongoing  GOAL:   Patient will meet greater than or equal to 90% of their needs  Met  MONITOR:   Vent status, Diet advancement, Skin, TF tolerance, Weight trends, Labs, I & O's (Goals of care)  REASON FOR ASSESSMENT:   Consult Enteral/tube feeding initiation and management  ASSESSMENT:   64 yo male PMHx COPDm CAD, HTN, GERD, Anemia, ESRD on HD. He suffered fall at [facility] and suffered head injury/laceration. Intubated for airway protection. CXR with pulmonary edema.  Labs reviewed: sodium low; potassium and phosphorus WNL. Currently receiving HD at bedside. Currently receiving Vital AF 1.2 via OGT at 55 ml/h (1320 ml/day) with Prostat 30 ml BID providing 1784 kcals, 129 gm protein, 1071 ml free water daily.  Patient is currently intubated on ventilator support. Received MD Consult for TF initiation and management. MV: 10.5 L/min Temp (24hrs), Avg:99.1 F (37.3 C), Min:97.4 F (36.3 C), Max:102 F (38.9 C)   Diet Order:  Diet NPO time specified  Skin:  Wound (see comment) (head lac)  Last BM:  Unknown  Height:   Ht Readings from Last 1 Encounters:  11/23/2014 _0  (1.803 m)    Weight:   Wt Readings from Last 1 Encounters:  10/28/14 194 lb 7.1 oz (88.2 kg)   10/02/14 207 lb (93.895 kg)        Ideal Body Weight:  78.18 kg  BMI:  Body mass index is 27.13 kg/(m^2).  Estimated Nutritional Needs:   Kcal:  2250  Protein:  130-140 gm  Fluid:  Per MD  EDUCATION NEEDS:   No education needs identified at this time  Molli Barrows, [place], [place], [place] Pager [phone] After Hours Pager [phone]

## 2014-10-29 ENCOUNTER — Other Ambulatory Visit (HOSPITAL_COMMUNITY): Payer: Medicare Other

## 2014-10-29 ENCOUNTER — Inpatient Hospital Stay (HOSPITAL_COMMUNITY): Payer: Medicare Other

## 2014-10-29 LAB — BASIC METABOLIC PANEL
Anion gap: 14 (ref 5–15)
BUN: 37 mg/dL — AB (ref 6–20)
CHLORIDE: 95 mmol/L — AB (ref 101–111)
CO2: 22 mmol/L (ref 22–32)
Calcium: 8.7 mg/dL — ABNORMAL LOW (ref 8.9–10.3)
Creatinine, Ser: 4.83 mg/dL — ABNORMAL HIGH (ref 0.61–1.24)
GFR calc Af Amer: 14 mL/min — ABNORMAL LOW (ref >60)
GFR calc non Af Amer: 12 mL/min — ABNORMAL LOW (ref >60)
GLUCOSE: 102 mg/dL — AB (ref 65–99)
POTASSIUM: 4.4 mmol/L (ref 3.5–5.1)
Sodium: 131 mmol/L — ABNORMAL LOW (ref 135–145)

## 2014-10-29 LAB — CBC
HEMATOCRIT: 36.6 % — AB (ref 39.0–52.0)
Hemoglobin: 11.8 g/dL — ABNORMAL LOW (ref 13.0–17.0)
MCH: 30.5 pg (ref 26.0–34.0)
MCHC: 32.2 g/dL (ref 30.0–36.0)
MCV: 94.6 fL (ref 78.0–100.0)
Platelets: 147 10*3/uL — ABNORMAL LOW (ref 150–400)
RBC: 3.87 MIL/uL — ABNORMAL LOW (ref 4.22–5.81)
RDW: 16.2 % — AB (ref 11.5–15.5)
WBC: 16.9 10*3/uL — ABNORMAL HIGH (ref 4.0–10.5)

## 2014-10-29 LAB — GLUCOSE, CAPILLARY
GLUCOSE-CAPILLARY: 110 mg/dL — AB (ref 65–99)
Glucose-Capillary: 104 mg/dL — ABNORMAL HIGH (ref 65–99)
Glucose-Capillary: 96 mg/dL (ref 65–99)

## 2014-10-29 LAB — CORTISOL: CORTISOL PLASMA: 22.9 ug/dL

## 2014-10-29 LAB — PHOSPHORUS: Phosphorus: 4.4 mg/dL (ref 2.5–4.6)

## 2014-10-29 MED ORDER — SODIUM CHLORIDE 0.9 % IV SOLN: INTRAVENOUS | Status: DC

## 2014-10-29 MED ORDER — HYDROCORTISONE NA SUCCINATE PF 100 MG IJ SOLR
50.0000 mg | Freq: Four times a day (QID) | INTRAMUSCULAR | Status: DC
  Administered 2014-10-30 (×2): 50 mg via INTRAVENOUS
  Filled 2014-10-29: qty 1
  Filled 2014-10-29: qty 2
  Filled 2014-10-29 (×2): qty 1
  Filled 2014-10-29: qty 2
  Filled 2014-10-29: qty 1

## 2014-10-29 MED ORDER — HYDROCORTISONE NA SUCCINATE PF 100 MG IJ SOLR: 50.0000 mg | Freq: Four times a day (QID) | INTRAMUSCULAR | Status: DC

## 2014-10-29 MED ORDER — ASPIRIN 81 MG PO CHEW
81.0000 mg | CHEWABLE_TABLET | Freq: Every day | ORAL | Status: DC
  Administered 2014-10-29 – 2014-10-30 (×2): 81 mg via ORAL
  Filled 2014-10-29: qty 1

## 2014-10-29 MED ORDER — BISACODYL 10 MG RE SUPP: 10.0000 mg | Freq: Once | RECTAL | Status: DC

## 2014-10-29 NOTE — Progress Notes (Signed)
Notified by Dr. Titus Mould of plan for comfort care and no further HD Orders discontinued Dialysis notified  Jamal Maes, MD [facility] [phone] Pager 10/29/2014, 6:21 PM

## 2014-10-29 NOTE — Progress Notes (Signed)
eLink Physician-Brief Progress Note Patient Name: [name] DOB: 30-Jun-1950 MRN: [id number]   Date of Service  10/29/2014  HPI/Events of Note  D/w wife. She described his prior wishes to NEVER have life support. Heart failure, ESRD. And per her "zero quality of life" She wishes comfort  Son will visit in am from [place] 6 pm  comfort after all visited No escalating, nO HD further Ill notify renal        Emre Stock J. 10/29/2014, 5:31 PM

## 2014-10-29 NOTE — Progress Notes (Signed)
PULMONARY / CRITICAL CARE MEDICINE   Name: [name] MRN: [id number] DOB: Jun 08, 1950    ADMISSION DATE:  11/05/2014  REFERRING MD :  ER  CHIEF COMPLAINT:  Syncope  INITIAL PRESENTATION:  64 yo male from NH with syncope, superficial head injury.  Intubated for airway protection.  CXR with pulmonary edema.  He has hx of ESRD on HD.  STUDIES:  10/01 CT head >> mild diffuse atrophy, frontal scalp hematoma 10/01 CT neck >> C3-C4 spinal stenosis  SIGNIFICANT EVENTS: 10/01 Admit, renal consulted 10/3 - A line fell out  SUBJECTIVE:  dnr established Remains in shock  VITAL SIGNS: Temp:  [97.4 F (36.3 C)-100.4 F (38 C)] 100.4 F (38 C) (10/04 0740) Pulse Rate:  [44-86] 81 (10/04 0630) Resp:  [5-25] 16 (10/04 0630) BP: (63-108)/(45-82) 94/61 mmHg (10/04 0630) SpO2:  [94 %-100 %] 97 % (10/04 0630) FiO2 (%):  [40 %] 40 % (10/04 0730) Weight:  [194 lb 7.1 oz (88.2 kg)-200 lb 2.8 oz (90.8 kg)] 200 lb 2.8 oz (90.8 kg) (10/04 0500) HEMODYNAMICS:   VENTILATOR SETTINGS: Vent Mode:  [-] PRVC FiO2 (%):  [40 %] 40 % Set Rate:  [16 bmp] 16 bmp Vt Set:  [600 mL] 600 mL PEEP:  [5 cmH20] 5 cmH20 Plateau Pressure:  [10 GHW29-93 cmH20] 10 cmH20 INTAKE / OUTPUT:  Intake/Output Summary (Last 24 hours) at 10/29/14 0748 Last data filed at 10/29/14 0600  Gross per 24 hour  Intake   3095 ml  Output   -100 ml  Net   3195 ml    PHYSICAL EXAMINATION: General: On vent resting comfortably Neuro: Opens eyes to name, will follow comands  HEENT:  Sutures along  Lt frontal area, pupils reactive, ETT in place Cardiovascular:  Regular, 2/6 SM  Lungs:  B/l diffuse crackles Abdomen:  Soft, normoactive BS  Musculoskeletal:  Lt femoral HD cath site clean, 1+ edema, old AV grafts in arms Skin:  Chronic venous stasis changes, head wound intact /suture in place , no redness   LABS:  CBC  Recent Labs Lab 10/30/2014 0625 10/27/14 0415 10/28/14 0530  WBC 5.7 9.0 14.5*  HGB 11.2* 11.3*  11.7*  HCT 35.9* 34.5* 36.7*  PLT 143* 138* 126*   Coag's  Recent Labs Lab 11/15/2014 0625  APTT 28  INR 1.17   BMET  Recent Labs Lab 10/27/14 0015 10/27/14 0415 10/28/14 0530  NA 137 136 133*  K 3.3* 3.5 3.6  CL 98* 98* 97*  CO2 27 28 24   BUN 14 16 33*  CREATININE 3.46* 3.84* 5.37*  GLUCOSE 96 100* 146*   Electrolytes  Recent Labs Lab 10/27/14 0015  10/27/14 0415 10/27/14 1133 10/27/14 2315 10/28/14 0530  CALCIUM 8.8*  --  8.7*  --   --  8.8*  MG 2.1  --   --   --   --   --   PHOS  --   < > 2.4* 2.7 3.3 3.8  < > = values in this interval not displayed. Sepsis Markers  Recent Labs Lab 11/18/2014 0623 10/28/14 0530  LATICACIDVEN 1.93 1.6   ABG  Recent Labs Lab 11/24/2014 0658 10/27/14 2320  PHART 7.336* 7.455*  PCO2ART 60.1* 36.7  PO2ART 419.0* 152*   Liver Enzymes  Recent Labs Lab 11/20/2014 0625 11/22/2014 2145 10/27/14 0415 10/28/14 0530  AST 25 20  --   --   ALT 17 18  --   --   ALKPHOS 98 94  --   --  BILITOT 1.2 1.7*  --   --   ALBUMIN 3.1* 3.0* 2.8* 2.5*   Cardiac Enzymes  Recent Labs Lab 11/25/2014 0905 11/13/2014 1530 11/07/2014 2022  TROPONINI 0.04* 0.06* 0.07*     Glucose  Recent Labs Lab 10/28/14 0425 10/28/14 0730 10/28/14 1148 10/28/14 1522 10/28/14 2321 10/29/14 0358  GLUCAP 116* 114* 94 95 104* 96    Imaging Dg Chest Port 1 View  10/29/2014   CLINICAL DATA:  Acute respiratory failure  EXAM: PORTABLE CHEST 1 VIEW  COMPARISON:  Portable chest x-ray of October 28, 2014  FINDINGS: The lungs are well-expanded. The interstitial markings remain increased especially on the right in the perihilar and infrahilar region. The retrocardiac region on the left remains dense and there is persistent obscuration of the hemidiaphragm. A small right pleural effusion remains. The cardiac silhouette remains enlarged. The pulmonary vascularity is engorged. The endotracheal tube tip projects 4.2 cm above the carina. The esophagogastric tube tip  projects below the inferior margin of the image.  IMPRESSION: Slight interval improvement in the appearance of pulmonary edema. Persistent left lower lobe atelectasis. Persistent small right pleural effusion.   Electronically Signed   By: David  Martinique M.D.   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 HTN on admission  Chronic hypotension on Midodrine  Hypotension/Septic shock -pressors started 10/1  Mildly elevated troponin suspect demand  P:  Continue ASA Wean pressors for MAP >50, chronic hypotension Cont home midodrine  Get cortisol, then start stress roids Wife , wish no escalation pressors  RENAL A:   ESRD on HD MWF. Hyponatremia, mild P:   Nephrology following, s/p HD 10/3 Daily BMET Need neg balance, will d/w renal  GASTROINTESTINAL A:   Nutrition. P:   Protonix for SUP Continue OGT feeds  Last BM assess  HEMATOLOGIC A:   Anemia of chronic disease - stable. P:  F/u Daily CBC SQ heparin for DVT prevention  INFECTIOUS A:   Fevers /Sepsis  - Unclear Source (recent groin Perma cath) P:   Vanc 10/2>>> Zosyn 10/2>>  10/1 BC x 2 >>  NGTD  Spike again, change to imipenem consider comfort care  ENDOCRINE A:   Hypoglycemia - resolved   R/o AI  P:   Monitor blood sugar on BMET Add stress roids  NEUROLOGIC A:   Syncope >> likely from hypoxia and pulmonary edema. Lt frontal scalp laceration. CT head w/ chronic changes and scalp hematoma CT spine , no fx, severe spinal stenosis at C3-4 P:   RASS goal: -1 Wound care Sedation weaned F/u mental status  Family Updates: wife discussed with [facility]  MD last night that she does not agree with aggressive Rx.  Wife called   Virginia Crews, MD, MPH PGY-2,  [facility] 10/29/2014 7:48 AM Pager [phone]  STAFF NOTE: [name], MD FACP have personally reviewed patient's available data, including medical history, events of note, physical examination and test results as part of my evaluation. I have discussed with resident/NP and other care providers such as pharmacist, RN and RRT. In addition,  I personally evaluated patient and elicited key findings of: awakens, remains on pressors, poor prognosis likely, need neg balance, fever noted, may need to change to IMIPENEm, will cal wife to discuss goakls, consider dc vent in next 24-48 hr, accept lower MAP goals 50, follow cultures, SBT planned repeat The patient is critically ill with multiple organ systems failure and requires high complexity decision making for assessment and support, frequent evaluation and titration of therapies, application of advanced monitoring technologies and extensive interpretation of multiple databases.   Critical Care Time devoted to patient care services described in this note is30  Minutes. This time reflects time of care of this signee: [name], MD FACP. This critical care time does not reflect procedure time, or teaching time or supervisory time of PA/NP/Med student/Med Resident etc but could involve care discussion time. Rest per NP/medical resident whose note is outlined above and that I agree with   Lavon Paganini. Titus Mould, MD, Violet Pgr: [facility] 10/29/2014 11:13 AM

## 2014-10-29 NOTE — Progress Notes (Signed)
[facility] Progress Note Patient Name: GABRIELLE MESTER DOB: 08-26-1950 MRN: [id number]   Date of Service  10/29/2014  HPI/Events of Note  const  eICU Interventions  dulc asupp     Intervention Category Minor Interventions: Routine modifications to care plan (e.g. PRN medications for pain, fever)  Raylene Miyamoto. 10/29/2014, 4:14 PM

## 2014-10-29 NOTE — Care Management Important Message (Signed)
Important Message  Patient Details  Name: [name] MRN: [id number] Date of Birth: January 25, 1951   Medicare Important Message Given:  Yes-second notification given    Nathen May 10/29/2014, 12:30 PM

## 2014-10-29 NOTE — Progress Notes (Signed)
[facility] Progress Note   Subjective: BP's 90's, remains on high dose of neo gtt. Fevers gradually declining, WBC up  Filed Vitals:   10/29/14 0740 10/29/14 0800 10/29/14 0900 10/29/14 1000  BP:  88/59 94/59 91/56   Pulse:  81 80 84  Temp: 100.4 F (38 C)     TempSrc: Oral     Resp:  16 16 19   Height:      Weight:      SpO2:  98% 99% 98%   Exam: On vent, unresponsive No jvd Chest clear ant and lat RRR distant HS Abd obese no ascites, +bs, no mass RUE 3+ chronic nonpitting swelling Lt femoral PC no drainage / R forearm AVF +faint bruit Neuro sedated  MWF [place]  4.5h   90.5kg   2/2.25 bath  R fem cath/ maturing RFA AVF  Heparin 1000/2000 mid Rx Hect 4 Mircera 50 every 5 wks, last 9/14      Assessment: 1 Acute resp failure/ pulm edema vs aspiration - remains on vent, cxr better. 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  Recent Labs Lab 11/07/2014 0625 11/03/2014 2145 10/27/14 0415 10/28/14 0530  AST 25 20  --   --   ALT 17 18  --   --   ALKPHOS 98 94  --   --   BILITOT 1.2 1.7*  --   --   PROT 7.1 7.1  --   --   ALBUMIN 3.1* 3.0* 2.8* 2.5*    Recent Labs Lab 11/18/2014 0625 10/27/14 0415  10/28/14 0530 10/29/14 0737  WBC 5.7 9.0 14.5* 16.9*  NEUTROABS 4.2  --   --   --   HGB 11.2* 11.3* 11.7* 11.8*  HCT 35.9* 34.5* 36.7* 36.6*  MCV 98.9 94.5 93.9 94.6  PLT 143* 138* 126* 147*   . antiseptic oral rinse  7 mL Mouth Rinse QID  . aspirin EC  81 mg Oral Daily  . chlorhexidine gluconate  15 mL Mouth Rinse BID  . cinacalcet  30 mg Oral Q breakfast  . doxercalciferol  2 mcg Intravenous Q M,W,F-HD  . feeding supplement (NEPRO CARB STEADY)  1,000 mL Oral Q24H  . feeding supplement (PRO-STAT SUGAR FREE 64)  30 mL Per Tube QID  . ferric gluconate (FERRLECIT/NULECIT) IV  125  mg Intravenous Q M,W,F-HD  . heparin subcutaneous  5,000 Units Subcutaneous 3 times per day  . midodrine  10 mg Oral TID  . pantoprazole sodium  40 mg Per Tube Q24H  . piperacillin-tazobactam (ZOSYN)  IV  2.25 g Intravenous 3 times per day  . vancomycin  1,000 mg Intravenous Q M,W,F-HD   . fentaNYL infusion INTRAVENOUS 25 mcg/hr (10/29/14 1043)  . phenylephrine (NEO-SYNEPHRINE) Adult infusion 200 mcg/min (10/29/14 0916)   sodium chloride, sodium chloride, Place/Maintain arterial line **AND** sodium chloride, acetaminophen (TYLENOL) oral liquid 160 mg/5 mL, fentaNYL, hydrALAZINE, lidocaine (PF), lidocaine-prilocaine, midazolam, pentafluoroprop-tetrafluoroeth

## 2014-10-30 LAB — BASIC METABOLIC PANEL
Anion gap: 12 (ref 5–15)
BUN: 52 mg/dL — ABNORMAL HIGH (ref 6–20)
CHLORIDE: 97 mmol/L — AB (ref 101–111)
CO2: 22 mmol/L (ref 22–32)
CREATININE: 5.78 mg/dL — AB (ref 0.61–1.24)
Calcium: 9.1 mg/dL (ref 8.9–10.3)
GFR, EST AFRICAN AMERICAN: 11 mL/min — AB (ref >60)
GFR, EST NON AFRICAN AMERICAN: 9 mL/min — AB (ref >60)
Glucose, Bld: 114 mg/dL — ABNORMAL HIGH (ref 65–99)
POTASSIUM: 5.1 mmol/L (ref 3.5–5.1)
SODIUM: 131 mmol/L — AB (ref 135–145)

## 2014-10-30 LAB — GLUCOSE, CAPILLARY
GLUCOSE-CAPILLARY: 114 mg/dL — AB (ref 65–99)
GLUCOSE-CAPILLARY: 126 mg/dL — AB (ref 65–99)
Glucose-Capillary: 143 mg/dL — ABNORMAL HIGH (ref 65–99)
Glucose-Capillary: 124 mg/dL — ABNORMAL HIGH (ref 65–99)

## 2014-10-30 LAB — CBC
HCT: 36.9 % — ABNORMAL LOW (ref 39.0–52.0)
HEMOGLOBIN: 12.5 g/dL — AB (ref 13.0–17.0)
MCH: 31.1 pg (ref 26.0–34.0)
MCHC: 33.9 g/dL (ref 30.0–36.0)
MCV: 91.8 fL (ref 78.0–100.0)
Platelets: 173 10*3/uL (ref 150–400)
RBC: 4.02 MIL/uL — AB (ref 4.22–5.81)
RDW: 15.9 % — ABNORMAL HIGH (ref 11.5–15.5)
WBC: 21.9 10*3/uL — ABNORMAL HIGH (ref 4.0–10.5)

## 2014-10-30 MED ORDER — MORPHINE SULFATE 25 MG/ML IV SOLN
10.0000 mg/h | INTRAVENOUS | Status: DC
  Administered 2014-10-30: 5 mg/h via INTRAVENOUS
  Filled 2014-10-30: qty 10

## 2014-10-30 MED ORDER — MORPHINE BOLUS VIA INFUSION
5.0000 mg | INTRAVENOUS | Status: DC | PRN
  Filled 2014-10-30: qty 20

## 2014-10-30 NOTE — Progress Notes (Signed)
RN assessed pt with family at bedside. Pt awake and alert. Does not appear to be in distress.

## 2014-10-30 NOTE — Procedures (Signed)
Extubation Procedure Note  Patient Details:   Name: [name] DOB: [birth year] MRN: [id number]   Airway Documentation:     Evaluation  O2 sats: stable throughout and currently acceptable Complications: This was a terminal extubation per order. Patient did tolerate procedure well. Bilateral Breath Sounds: Rhonchi Suctioning: Airway No  Miquel Dunn 10/30/2014, 6:18 PM

## 2014-10-30 NOTE — Progress Notes (Signed)
Patient now comfort care, no further HD.  Will sign off, please call as needed.   Kelly Splinter MD (pgr) [phone]    [phone] 10/30/2014, 9:06 AM

## 2014-10-30 NOTE — Progress Notes (Signed)
   10/30/14 1500  Clinical Encounter Type  Visited With Patient;Family;Patient and family together  Visit Type Critical Care;Patient actively dying  Referral From Nurse;Physician  Consult/Referral To Chaplain  Spiritual Encounters  Spiritual Needs Prayer;Emotional;Grief support   Chaplain went to see Pt. And the Minister (of the Pt.)  was at bedside with family.Marland KitchenMarland Kitchen

## 2014-10-30 NOTE — Progress Notes (Signed)
PULMONARY / CRITICAL CARE MEDICINE   Name: [name] MRN: [id number] DOB: 06/04/1950    ADMISSION DATE:  11/22/2014  REFERRING MD :  ER  CHIEF COMPLAINT:  Syncope  INITIAL PRESENTATION:  64 yo male from NH with syncope, superficial head injury.  Intubated for airway protection.  CXR with pulmonary edema.  He has hx of ESRD on HD.  STUDIES:  10/01 CT head >> mild diffuse atrophy, frontal scalp hematoma 10/01 CT neck >> C3-C4 spinal stenosis  SIGNIFICANT EVENTS: 10/01 Admit, renal consulted 10/3 - A line fell out 10/4 - discussion with wife, decision for full comfort care  SUBJECTIVE:  Made full comfort care after discussion with wife  VITAL SIGNS: Temp:  [98 F (36.7 C)-100.4 F (38 C)] 98.1 F (36.7 C) (10/05 0727) Pulse Rate:  [60-91] 73 (10/05 0700) Resp:  [7-30] 16 (10/05 0700) BP: (61-94)/(42-74) 81/56 mmHg (10/05 0700) SpO2:  [96 %-100 %] 100 % (10/05 0700) FiO2 (%):  [40 %] 40 % (10/05 0400) Weight:  [206 lb 12.7 oz (93.8 kg)] 206 lb 12.7 oz (93.8 kg) (10/05 0500) HEMODYNAMICS:   VENTILATOR SETTINGS: Vent Mode:  [-] PRVC FiO2 (%):  [40 %] 40 % Set Rate:  [16 bmp] 16 bmp Vt Set:  [600 mL] 600 mL PEEP:  [5 cmH20] 5 cmH20 Plateau Pressure:  [22 cmH20-23 cmH20] 22 cmH20 INTAKE / OUTPUT:  Intake/Output Summary (Last 24 hours) at 10/30/14 0732 Last data filed at 10/30/14 0700  Gross per 24 hour  Intake 2239.09 ml  Output      0 ml  Net 2239.09 ml    PHYSICAL EXAMINATION: General: On vent resting comfortably Neuro: Opens eyes to name, will not follow comands  HEENT:  Sutures along  Lt frontal area, pupils reactive, ETT in place Cardiovascular:  Regular, 2/6 SM  Lungs:  B/l diffuse crackles Abdomen:  Soft, normoactive BS  Musculoskeletal:  Lt femoral HD cath site clean, 1+ edema, old AV grafts in arms Skin:  Chronic venous stasis changes, head wound intact /suture in place , no redness   LABS:  CBC  Recent Labs Lab 10/28/14 0530  10/29/14 0737 10/30/14 0333  WBC 14.5* 16.9* 21.9*  HGB 11.7* 11.8* 12.5*  HCT 36.7* 36.6* 36.9*  PLT 126* 147* 173   Coag's  Recent Labs Lab 11/02/2014 0625  APTT 28  INR 1.17   BMET  Recent Labs Lab 10/28/14 0530 10/29/14 0737 10/30/14 0333  NA 133* 131* 131*  K 3.6 4.4 5.1  CL 97* 95* 97*  CO2 24 22 22   BUN 33* 37* 52*  CREATININE 5.37* 4.83* 5.78*  GLUCOSE 146* 102* 114*   Electrolytes  Recent Labs Lab 10/27/14 0015  10/27/14 2315 10/28/14 0530 10/29/14 0737 10/30/14 0333  CALCIUM 8.8*  < >  --  8.8* 8.7* 9.1  MG 2.1  --   --   --   --   --   PHOS  --   < > 3.3 3.8 4.4  --   < > = values in this interval not displayed. Sepsis Markers  Recent Labs Lab 11/07/2014 0623 10/28/14 0530  LATICACIDVEN 1.93 1.6   ABG  Recent Labs Lab 11/21/2014 0658 10/27/14 2320  PHART 7.336* 7.455*  PCO2ART 60.1* 36.7  PO2ART 419.0* 152*   Liver Enzymes  Recent Labs Lab 11/17/2014 0625 11/03/2014 2145 10/27/14 0415 10/28/14 0530  AST 25 20  --   --   ALT 17 18  --   --  ALKPHOS 98 94  --   --   BILITOT 1.2 1.7*  --   --   ALBUMIN 3.1* 3.0* 2.8* 2.5*   Cardiac Enzymes  Recent Labs Lab 11/15/2014 0905 11/10/2014 1530 11/11/2014 2022  TROPONINI 0.04* 0.06* 0.07*     Glucose  Recent Labs Lab 10/28/14 1522 10/28/14 2321 10/29/14 0358 10/29/14 1218 10/29/14 2329 10/30/14 0323  GLUCAP 95 104* 96 110* 114* 126*    Imaging No results found.   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 HTN on admission  Chronic hypotension on Midodrine  Hypotension/Septic shock -pressors started 10/1  Mildly elevated troponin suspect demand  P:  Continue ASA Wean pressors - comfort care, when ett removed  Cont home midodrine  Discontinue stress steroids - no adrenal  insufficiency  RENAL A:   ESRD on HD MWF. Hyponatremia, mild P:   No further HD per wife  GASTROINTESTINAL A:   Nutrition. P:   Protonix for SUP Continue OGT feeds -to off  HEMATOLOGIC A:   Anemia of chronic disease - stable. P:  F/u Daily CBC SQ heparin for DVT prevention  INFECTIOUS A:   Fevers /Sepsis  - Unclear Source (recent groin Perma cath) P:   Vanc 10/2>>> Zosyn 10/2>>  10/1 BC x 2 >>  NGTD  D/c abx today as patient now full comfort care  ENDOCRINE A:   Hypoglycemia - resolved   No evidence of adrenal insufficiency P:   Monitor blood sugar on BMET D/c stress steroids  NEUROLOGIC A:   Syncope >> likely from hypoxia and pulmonary edema. Lt frontal scalp laceration. CT head w/ chronic changes and scalp hematoma CT spine , no fx, severe spinal stenosis at C3-4 P:   RASS goal: -1 Wound care Sedation weaned  Family Updates: wife to visit today per discussion yesterday   Virginia Crews, MD, MPH PGY-2,  [facility] 10/30/2014 7:32 AM Pager [phone]  STAFF NOTE: [name], MD FACP have personally reviewed patient's available data, including medical history, events of note, physical examination and test results as part of my evaluation. I have discussed with resident/NP and other care providers such as pharmacist, RN and RRT. In addition, I personally evaluated patient and elicited key findings of: opens eyes, wiggled toes, increase coarse BS, wife wishes comfort care, no further HD planned, dc blood work, add morphine now then consider titration to rr 12-18 on short 30 sec cpap 5 ps 5 weans, dc TF, dc abx, his qol was so poor per wife and he was clear to not want to continue under these circumstances  The patient is critically ill with multiple organ systems failure and requires high complexity decision making for assessment and support, frequent evaluation and titration of therapies, application of advanced monitoring  technologies and extensive interpretation of multiple databases.   Critical Care Time devoted to patient care services described in this note is 30 Minutes. This time reflects time of care of this signee: [name], MD FACP. This critical care time does not reflect procedure time, or teaching time or supervisory time of PA/NP/Med student/Med Resident etc but could involve care discussion time. Rest per NP/medical resident whose note is outlined above and that I agree with   Lavon Paganini. Titus Mould, MD, Rosalita Chessman  Pgr: [phone] [facility] 10/30/2014 9:05 AM

## 2014-10-31 LAB — CULTURE, BLOOD (ROUTINE X 2)
Culture: NO GROWTH
Culture: NO GROWTH

## 2014-10-31 NOTE — Progress Notes (Signed)
PULMONARY / CRITICAL CARE MEDICINE   Name: [name] MRN: [id number] DOB: 1950-05-26    ADMISSION DATE:  11/13/2014  REFERRING MD :  ER  CHIEF COMPLAINT:  Syncope  INITIAL PRESENTATION:  64 yo male from NH with syncope, superficial head injury.  Intubated for airway protection.  CXR with pulmonary edema.  He has hx of ESRD on HD.  STUDIES:  10/01 CT head >> mild diffuse atrophy, frontal scalp hematoma 10/01 CT neck >> C3-C4 spinal stenosis  SIGNIFICANT EVENTS: 10/01 Admit, renal consulted 10/3 - A line fell out 10/4 - discussion with wife, decision for full comfort care 10/5 - off pressors, extubated, morphine gtt  SUBJECTIVE:  Made full comfort care after discussion with wife  VITAL SIGNS: Temp:  [97.6 F (36.4 C)-97.9 F (36.6 C)] 97.6 F (36.4 C) (10/05 1532) Pulse Rate:  [70-79] 79 (10/05 2000) Resp:  [15-26] 25 (10/05 2000) BP: (63-80)/(44-57) 73/54 mmHg (10/05 1800) SpO2:  [90 %-100 %] 90 % (10/05 2000) FiO2 (%):  [40 %] 40 % (10/05 1600) HEMODYNAMICS:   VENTILATOR SETTINGS: Vent Mode:  [-] PRVC FiO2 (%):  [40 %] 40 % Set Rate:  [16 bmp] 16 bmp Vt Set:  [600 mL] 600 mL PEEP:  [5 cmH20] 5 cmH20 Plateau Pressure:  [24 cmH20] 24 cmH20 INTAKE / OUTPUT:  Intake/Output Summary (Last 24 hours) at 11/20/2014 0734 Last data filed at 10/30/2014 0600  Gross per 24 hour  Intake  538.6 ml  Output      0 ml  Net  538.6 ml    PHYSICAL EXAMINATION: General: comfortable  Neuro: Opens eyes to name, will not follow comand HEENT:  Sutures along  Lt frontal area, pupils reactive Cardiovascular:  Regular, 2/6 SM  Lungs: crackles severe Abdomen:  Soft, normoactive BS  Musculoskeletal:  Lt femoral HD cath site clean, 1+ edema, old AV grafts in arms Skin:  Chronic venous stasis changes, head wound intact /suture in place , no redness   LABS:  CBC  Recent Labs Lab 10/28/14 0530 10/29/14 0737 10/30/14 0333  WBC 14.5* 16.9* 21.9*  HGB 11.7* 11.8* 12.5*  HCT  36.7* 36.6* 36.9*  PLT 126* 147* 173   Coag's  Recent Labs Lab 10/28/2014 0625  APTT 28  INR 1.17   BMET  Recent Labs Lab 10/28/14 0530 10/29/14 0737 10/30/14 0333  NA 133* 131* 131*  K 3.6 4.4 5.1  CL 97* 95* 97*  CO2 24 22 22   BUN 33* 37* 52*  CREATININE 5.37* 4.83* 5.78*  GLUCOSE 146* 102* 114*   Electrolytes  Recent Labs Lab 10/27/14 0015  10/27/14 2315 10/28/14 0530 10/29/14 0737 10/30/14 0333  CALCIUM 8.8*  < >  --  8.8* 8.7* 9.1  MG 2.1  --   --   --   --   --   PHOS  --   < > 3.3 3.8 4.4  --   < > = values in this interval not displayed. Sepsis Markers  Recent Labs Lab 11/03/2014 0623 10/28/14 0530  LATICACIDVEN 1.93 1.6   ABG  Recent Labs Lab 11/22/2014 0658 10/27/14 2320  PHART 7.336* 7.455*  PCO2ART 60.1* 36.7  PO2ART 419.0* 152*   Liver Enzymes  Recent Labs Lab 11/06/2014 0625 11/19/2014 2145 10/27/14 0415 10/28/14 0530  AST 25 20  --   --   ALT 17 18  --   --   ALKPHOS 98 94  --   --   BILITOT 1.2 1.7*  --   --  ALBUMIN 3.1* 3.0* 2.8* 2.5*   Cardiac Enzymes  Recent Labs Lab 11/13/2014 0905 11/18/2014 1530 11/02/2014 2022  TROPONINI 0.04* 0.06* 0.07*     Glucose  Recent Labs Lab 10/29/14 0358 10/29/14 1218 10/29/14 2329 10/30/14 0323 10/30/14 0722 10/30/14 1120  GLUCAP 96 110* 114* 126* 143* 124*    Imaging No results found.   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 HTN on admission  Chronic hypotension on Midodrine  Hypotension/Septic shock -pressors started 10/1  Mildly elevated troponin suspect demand  P:  D/c ASA Off pressors  D/c home midodrine   RENAL A:   ESRD on HD MWF. Hyponatremia, mild P:   No further HD  GASTROINTESTINAL A:   Nutrition. P:   No tube feeds, full comfort care  HEMATOLOGIC A:   Anemia of chronic  disease - stable. P:  No DVT ppx No further blood draws  INFECTIOUS A:   Fevers /Sepsis  - Unclear Source (recent groin Perma cath) P:   Vanc 10/2>>>10/5 Zosyn 10/2>> 10/5 10/1 BC x 2 >>  NGTD  Full comfort care  ENDOCRINE A:   Hypoglycemia - resolved   No evidence of adrenal insufficiency P:   No further blood draws  NEUROLOGIC A:   Syncope >> likely from hypoxia and pulmonary edema. Lt frontal scalp laceration. CT head w/ chronic changes and scalp hematoma CT spine , no fx, severe spinal stenosis at C3-4 P:   Wound care Sedation weaned Morphine gtt for cofort  Family Updates:   Virginia Crews, MD, MPH PGY-2,  [facility] 10/26/2014 7:34 AM Pager [phone]  STAFF NOTE: [name], MD FACP have personally reviewed patient's available data, including medical history, events of note, physical examination and test results as part of my evaluation. I have discussed with resident/NP and other care providers such as pharmacist, RN and RRT. In addition, I personally evaluated patient and elicited key findings of: appears comfortable overall, dc midodrine, dc asa, titration morphine to rr 12-18 or pain, transfer to floor, no escalation O2 needed, i updated daughter at bedside  Lavon Paganini. Titus Mould, MD, [place] Pgr: [facility] 11/21/2014 9:05 AM

## 2014-10-31 NOTE — Progress Notes (Signed)
Spoke with medical examiner, Vicente Males.  Patient is not a medical examiner case.

## 2014-10-31 NOTE — Progress Notes (Signed)
Patient pronounced deceased at 51 by two RN...Ludwig Clarks and [facility] notified

## 2014-11-04 ENCOUNTER — Telehealth: Payer: Self-pay

## 2014-11-04 NOTE — Telephone Encounter (Signed)
On 11/04/2014 I received a death certificate from [facility]. The death certificate is for burial. The patient is a patient of Doctor Titus Mould.... The death certificate will be taken to the [facility]) tomorrow for signature.  On 2014-11-14 I received the death certificate back from Doctor Titus Mould. I got the death certificate ready for pickup and called the funeral home to let them know the death certificate is ready for pickup.

## 2014-11-05 ENCOUNTER — Ambulatory Visit: Payer: Medicare Other | Admitting: Gastroenterology

## 2014-11-06 ENCOUNTER — Telehealth: Payer: Self-pay | Admitting: Internal Medicine

## 2014-11-06 NOTE — Telephone Encounter (Signed)
Fault in it

## 2014-11-06 NOTE — Telephone Encounter (Signed)
Will hold in triage until 11/07/14 and will route to Dr Titus Mould

## 2014-11-06 NOTE — Telephone Encounter (Signed)
Patient was last seen by Dr. Titus Mould.  This needs to be addressed by Dr. Titus Mould.  You will need to page Dr. Titus Mould to address this issue when he is back at work on 11/07/14.

## 2014-11-06 NOTE — Telephone Encounter (Signed)
Received call from Katy Fitch, [place] [employer], works for [facility] and investigate claims against [facility], there was a claim put in by the family that the nursing home let patient fall, injury to head and they believe this to be cause of patient's death.  Freda Munro says that based on the information she received, patient's fall caused a Hematoma and Laceration that required stitches, she is unsure if the cause of death is related to the fall. Freda Munro needs to know patient's cause of death.  Dr. Halford Chessman, please advise.

## 2014-11-07 NOTE — Telephone Encounter (Signed)
Dr. Titus Mould please advise if you can speak to [facility] with [employer] at [phone].  Thanks!

## 2014-11-07 NOTE — Telephone Encounter (Signed)
I am more than happy to speak to the STATE rep But I will need risk management to approve that first Can you call my cell 8 am  [phone] dan

## 2014-11-08 NOTE — Telephone Encounter (Addendum)
Spoke with Dennison Bulla. Jodi Geralds with risk management will be contacted by Lanny Hurst, she will then reach out to Dr. Titus Mould. Will await further instructions by Lanny Hurst or Dr. Titus Mould.

## 2014-11-12 NOTE — Telephone Encounter (Signed)
Spoke with [name]. [name] has communicate with Dr. Titus Mould and risk management. Dr. Titus Mould is scheduled to speak with risk management on 10.18.2016.

## 2014-11-14 NOTE — Telephone Encounter (Signed)
Will wait until Lanny Hurst returns to office to follow up on the status of this message.

## 2014-11-15 NOTE — Telephone Encounter (Signed)
Will wait until Lanny Hurst returns to office to obtain status

## 2014-11-18 NOTE — Telephone Encounter (Signed)
Per Lanny Hurst, Dr. Titus Mould has already spoken w/ risk management and Freda Munro. Nothing further needed

## 2014-11-26 NOTE — Discharge Summary (Signed)
NAMESTRUMMER, CANIPE NO.:  [id number]  MEDICAL RECORD NO.:  [id number]  LOCATION:  6N11C                        FACILITY:  [facility]  PHYSICIAN:  Raylene Miyamoto, MD DATE OF BIRTH:  1950/05/09  DATE OF ADMISSION:  11/17/2014 DATE OF DISCHARGE:  2014-11-17                              DISCHARGE SUMMARY   DEATH SUMMARY:  This was an unfortunate 64 year old male, nursing home resident, who had syncope and suffered superficial head injury; he was intubated for airway protection.  Chest x-ray showed frank pulmonary edema.  He had a history of end-stage renal disease as well as cardiomyopathy, on dialysis.  CT scan of the head on October 1st showed mild diffuse atrophy, frontal scalp hematoma.  CT scan of the neck showed C3-C4 spinal stenosis without any acute fracture.  Significant events during the patient's hospitalization including renal consultation on October 1st upon admission for renal replacement therapy.  On October 3rd, the patient's arterial line was not required.  On October 4th, there was continued discussion with the wife given the fact the patient had not really improved, hypotension continued to be a problem in terms of his shock state and pressor needs and in terms of his ability to pull off fluid for his pulmonary edema which was not improving whatsoever. It had been told to his wife that it was crystal clear he had not wanted to be continued under these circumstances and quite frankly it seemed that our ability to continue with renal replacement therapy can be effective in terms of wire removal has become a significant issue, likely related to his cardiomyopathy and end-stage renal disease, and therefore, wife opted for comfort care.  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  He did have mild elevated troponins which we suspected was demand in nature.  In terms of his demand ischemia,  he was treated for fevers initially and sepsis with vancomycin and Zosyn which were treated for three days and discontinued secondary to culture negative data and low clinical suspicion for infection.  His syncope seemed to be related to his likely hypoxia and acute pulmonary edema and likely was the cause of his fall.  The patient was provided a comfort care and expired.  FINAL DIAGNOSIS UPON DEATH: 1. Acute pulmonary edema secondary to nonischemic cardiomyopathy and     end-stage renal disease. 2. Refractory edema. 3. Shock related to cardiogenic in nature, likely. 4. End-stage renal disease with pulmonary edema. 5. Status post fall most likely secondary to acute hypoxemia from     pulmonary edema.     Raylene Miyamoto, MD     DJF/MEDQ  D:  11/13/2014  T:  11/13/2014  Job:  [id number]

## 2014-11-26 NOTE — Care Management Note (Signed)
Case Management Note  Patient Details  Name: [name] MRN: [id number] Date of Birth: [birth year]  Subjective/Objective:                    Action/Plan:   Expected Discharge Date:                  Expected Discharge Plan:  [place]  In-House Referral:     Discharge planning Services  CM Consult  Post Acute Care Choice:    Choice offered to:     DME Arranged:    DME Agency:     HH Arranged:    [place] Agency:     Status of Service:  Completed, signed off  Medicare Important Message Given:  Yes-second notification given Date Medicare IM Given:    Medicare IM give by:    Date Additional Medicare IM Given:    Additional Medicare Important Message give by:     If discussed at [place] of Stay Meetings, dates discussed:    Additional Comments:  Vergie Living, RN November 30, 2014, 8:32 AM

## 2014-11-26 DEATH — deceased

## 2016-06-04 IMAGING — CR DG CHEST 1V
1 series · 1 of 1 positions shown · non-contrast
Comparison: 10/11/2013

CLINICAL DATA: Cardiomyopathy.  Fluid retention.

EXAM:
CHEST  1 VIEW

[ap]
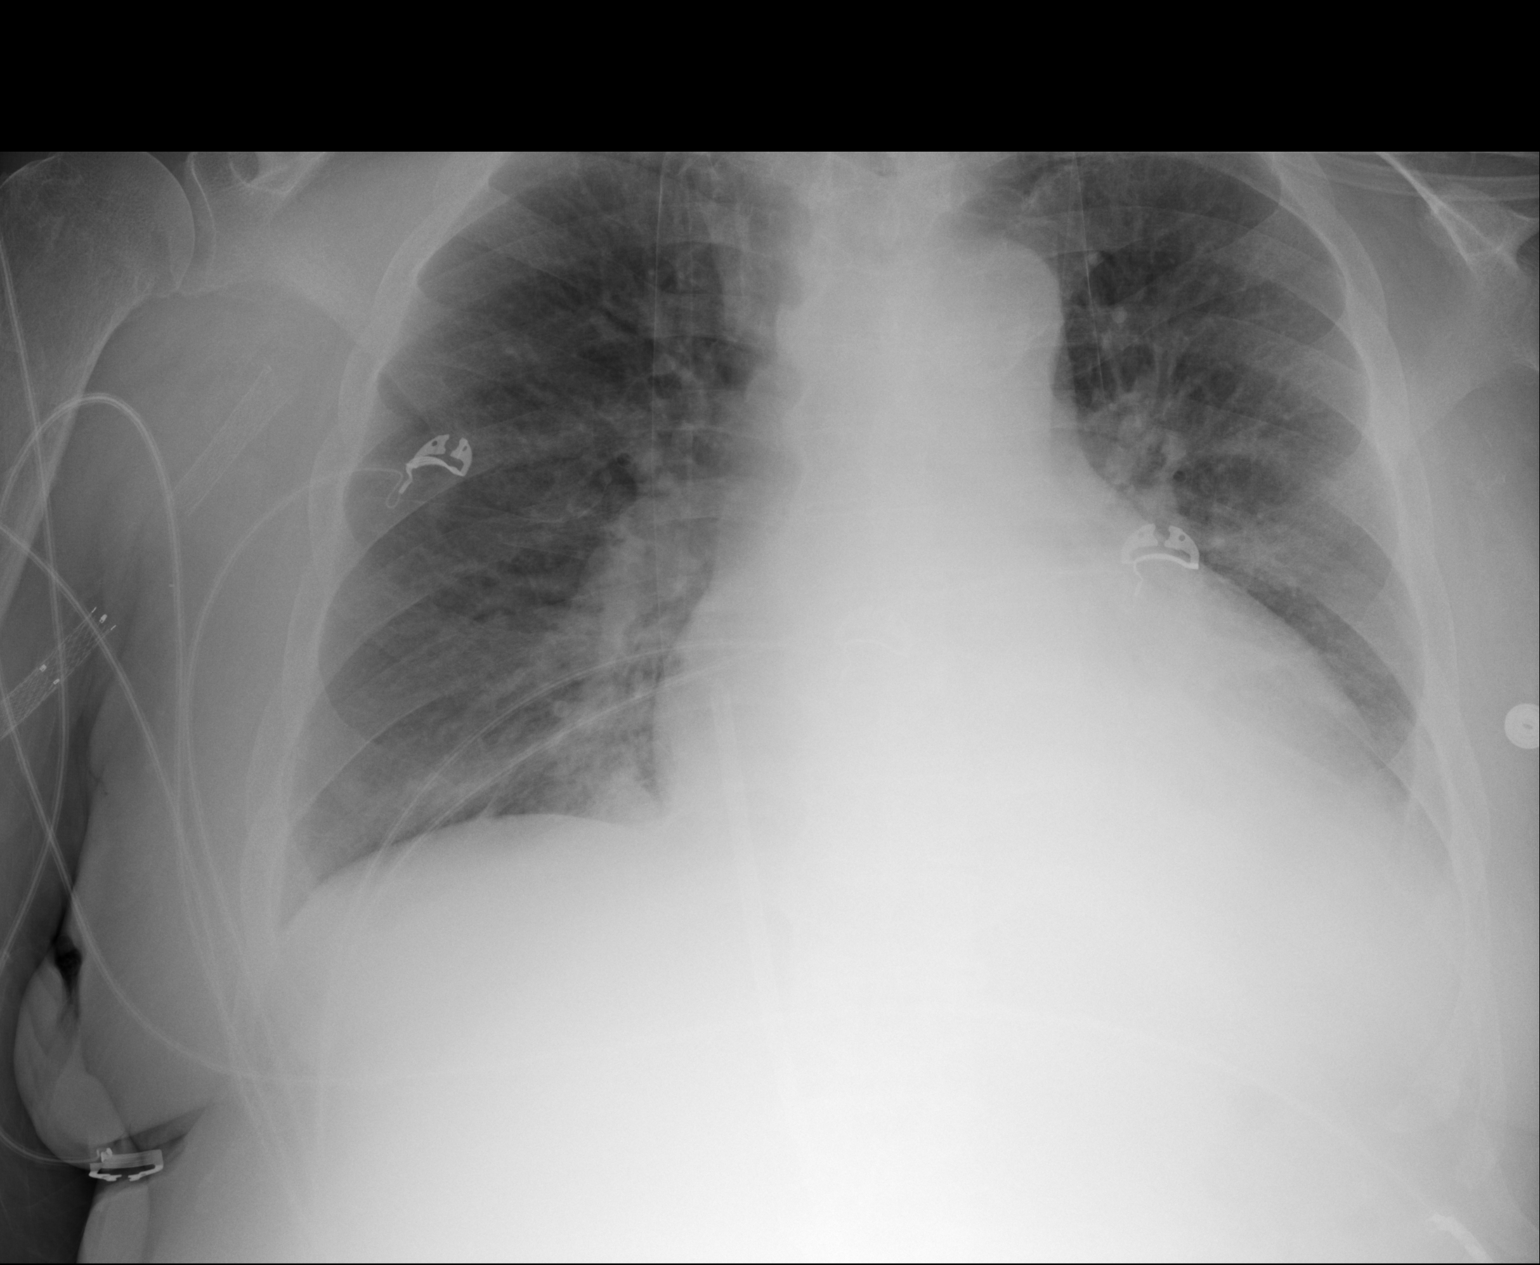

[1 of 1 positions shown; findings below may reference images not displayed]

FINDINGS: Moderate cardiac enlargement noted. There is a small left pleural
effusion identified. Pulmonary vascular congestion is identified.
IMPRESSION: 1. Cardiac enlargement.
2. Left pleural effusion and pulmonary vascular congestion.

## 2016-07-28 IMAGING — CR DG CHEST 1V PORT
1 series · 1 of 1 positions shown · non-contrast
Comparison: Portable chest x-ray October 28, 2014

CLINICAL DATA: Acute respiratory failure

EXAM:
PORTABLE CHEST 1 VIEW

[AP]
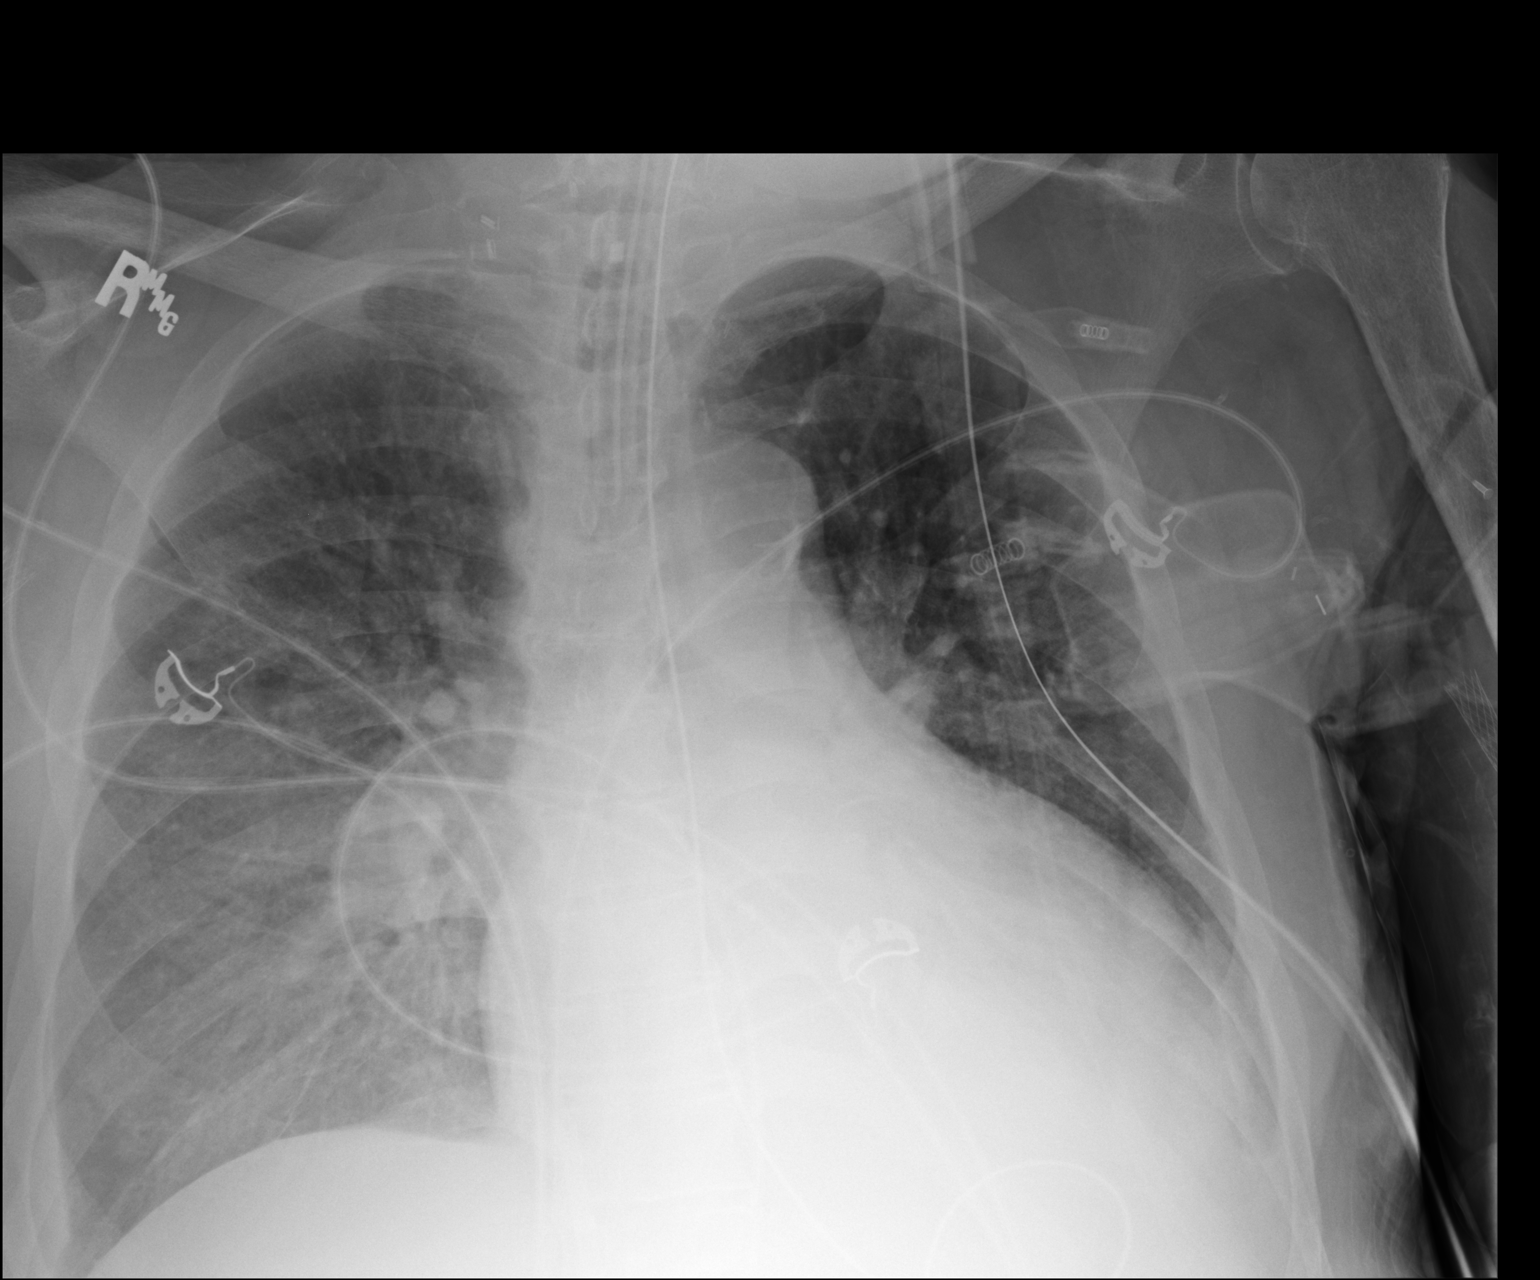

[1 of 1 positions shown; findings below may reference images not displayed]

FINDINGS: The lungs are well-expanded. The interstitial markings remain
increased especially on the right in the perihilar and infrahilar
region. The retrocardiac region on the left remains dense and there
is persistent obscuration of the hemidiaphragm. A small right
pleural effusion remains. The cardiac silhouette remains enlarged.
The pulmonary vascularity is engorged. The endotracheal tube tip
projects 4.2 cm above the carina. The esophagogastric tube tip
projects below the inferior margin of the image.
IMPRESSION: Slight interval improvement in the appearance of pulmonary edema.
Persistent left lower lobe atelectasis. Persistent small right
pleural effusion.
